# Patient Record
Sex: Female | Born: 1980 | Race: Black or African American | Hispanic: No | Marital: Single | Smoking: Former smoker
Health system: Southern US, Community
[De-identification: ages and names within clinical notes are randomized; demographics above are authoritative.]

## PROBLEM LIST (undated history)

## (undated) ENCOUNTER — Inpatient Hospital Stay (HOSPITAL_COMMUNITY): Payer: Self-pay

## (undated) ENCOUNTER — Emergency Department (HOSPITAL_COMMUNITY): Payer: Medicaid Other | Source: Home / Self Care

## (undated) DIAGNOSIS — F410 Panic disorder [episodic paroxysmal anxiety] without agoraphobia: Secondary | ICD-10-CM

## (undated) DIAGNOSIS — E039 Hypothyroidism, unspecified: Secondary | ICD-10-CM

## (undated) DIAGNOSIS — I729 Aneurysm of unspecified site: Secondary | ICD-10-CM

## (undated) DIAGNOSIS — N390 Urinary tract infection, site not specified: Secondary | ICD-10-CM

## (undated) DIAGNOSIS — R569 Unspecified convulsions: Secondary | ICD-10-CM

## (undated) DIAGNOSIS — G5603 Carpal tunnel syndrome, bilateral upper limbs: Secondary | ICD-10-CM

## (undated) DIAGNOSIS — E079 Disorder of thyroid, unspecified: Secondary | ICD-10-CM

## (undated) HISTORY — DX: Disorder of thyroid, unspecified: E07.9

## (undated) HISTORY — PX: ANEURYSM COILING: SHX5349

## (undated) HISTORY — DX: Hypothyroidism, unspecified: E03.9

## (undated) HISTORY — PX: BRAIN SURGERY: SHX531

## (undated) HISTORY — PX: TONSILLECTOMY: SUR1361

---
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
## 2000-06-18 DIAGNOSIS — I729 Aneurysm of unspecified site: Secondary | ICD-10-CM

## 2000-06-18 HISTORY — DX: Aneurysm of unspecified site: I72.9

## 2001-03-16 ENCOUNTER — Emergency Department (HOSPITAL_COMMUNITY): Admission: EM | Admit: 2001-03-16 | Discharge: 2001-03-16 | Payer: Self-pay | Admitting: Emergency Medicine

## 2001-03-16 ENCOUNTER — Inpatient Hospital Stay (HOSPITAL_COMMUNITY): Admission: EM | Admit: 2001-03-16 | Discharge: 2001-03-29 | Payer: Self-pay | Admitting: Emergency Medicine

## 2001-03-16 ENCOUNTER — Encounter (INDEPENDENT_AMBULATORY_CARE_PROVIDER_SITE_OTHER): Payer: Self-pay | Admitting: *Deleted

## 2001-03-16 ENCOUNTER — Encounter: Payer: Self-pay | Admitting: Neurosurgery

## 2001-03-16 ENCOUNTER — Encounter: Payer: Self-pay | Admitting: Emergency Medicine

## 2001-03-17 ENCOUNTER — Encounter: Payer: Self-pay | Admitting: Neurosurgery

## 2001-03-19 ENCOUNTER — Encounter: Payer: Self-pay | Admitting: Neurosurgery

## 2001-03-21 ENCOUNTER — Encounter: Payer: Self-pay | Admitting: Neurosurgery

## 2001-03-22 ENCOUNTER — Encounter: Payer: Self-pay | Admitting: Neurosurgery

## 2001-03-24 ENCOUNTER — Encounter: Payer: Self-pay | Admitting: Neurosurgery

## 2001-10-30 ENCOUNTER — Emergency Department (HOSPITAL_COMMUNITY): Admission: EM | Admit: 2001-10-30 | Discharge: 2001-10-30 | Payer: Self-pay | Admitting: Emergency Medicine

## 2002-02-26 ENCOUNTER — Emergency Department (HOSPITAL_COMMUNITY): Admission: EM | Admit: 2002-02-26 | Discharge: 2002-02-26 | Payer: Self-pay

## 2002-08-14 ENCOUNTER — Emergency Department (HOSPITAL_COMMUNITY): Admission: EM | Admit: 2002-08-14 | Discharge: 2002-08-14 | Payer: Self-pay | Admitting: Emergency Medicine

## 2002-08-31 ENCOUNTER — Emergency Department (HOSPITAL_COMMUNITY): Admission: EM | Admit: 2002-08-31 | Discharge: 2002-08-31 | Payer: Self-pay | Admitting: Emergency Medicine

## 2002-12-16 ENCOUNTER — Emergency Department (HOSPITAL_COMMUNITY): Admission: EM | Admit: 2002-12-16 | Discharge: 2002-12-16 | Payer: Self-pay | Admitting: Emergency Medicine

## 2003-04-24 ENCOUNTER — Inpatient Hospital Stay (HOSPITAL_COMMUNITY): Admission: AD | Admit: 2003-04-24 | Discharge: 2003-04-24 | Payer: Self-pay | Admitting: *Deleted

## 2003-05-28 ENCOUNTER — Inpatient Hospital Stay (HOSPITAL_COMMUNITY): Admission: AD | Admit: 2003-05-28 | Discharge: 2003-05-28 | Payer: Self-pay | Admitting: Obstetrics and Gynecology

## 2003-06-11 ENCOUNTER — Emergency Department (HOSPITAL_COMMUNITY): Admission: EM | Admit: 2003-06-11 | Discharge: 2003-06-11 | Payer: Self-pay | Admitting: Emergency Medicine

## 2003-10-11 ENCOUNTER — Emergency Department (HOSPITAL_COMMUNITY): Admission: EM | Admit: 2003-10-11 | Discharge: 2003-10-11 | Payer: Self-pay | Admitting: Emergency Medicine

## 2004-01-04 ENCOUNTER — Emergency Department (HOSPITAL_COMMUNITY): Admission: EM | Admit: 2004-01-04 | Discharge: 2004-01-04 | Payer: Self-pay | Admitting: Emergency Medicine

## 2004-07-15 ENCOUNTER — Emergency Department (HOSPITAL_COMMUNITY): Admission: EM | Admit: 2004-07-15 | Discharge: 2004-07-15 | Payer: Self-pay | Admitting: Emergency Medicine

## 2005-05-19 ENCOUNTER — Emergency Department (HOSPITAL_COMMUNITY): Admission: EM | Admit: 2005-05-19 | Discharge: 2005-05-19 | Payer: Self-pay | Admitting: Emergency Medicine

## 2005-06-30 ENCOUNTER — Emergency Department (HOSPITAL_COMMUNITY): Admission: EM | Admit: 2005-06-30 | Discharge: 2005-06-30 | Payer: Self-pay | Admitting: Emergency Medicine

## 2006-05-20 ENCOUNTER — Emergency Department (HOSPITAL_COMMUNITY): Admission: EM | Admit: 2006-05-20 | Discharge: 2006-05-20 | Payer: Self-pay | Admitting: Emergency Medicine

## 2006-07-26 ENCOUNTER — Emergency Department (HOSPITAL_COMMUNITY): Admission: EM | Admit: 2006-07-26 | Discharge: 2006-07-26 | Payer: Self-pay | Admitting: Emergency Medicine

## 2006-11-29 ENCOUNTER — Emergency Department (HOSPITAL_COMMUNITY): Admission: EM | Admit: 2006-11-29 | Discharge: 2006-11-29 | Payer: Self-pay | Admitting: Emergency Medicine

## 2007-01-30 ENCOUNTER — Emergency Department (HOSPITAL_COMMUNITY): Admission: EM | Admit: 2007-01-30 | Discharge: 2007-01-30 | Payer: Self-pay | Admitting: Emergency Medicine

## 2007-06-28 ENCOUNTER — Emergency Department (HOSPITAL_COMMUNITY): Admission: EM | Admit: 2007-06-28 | Discharge: 2007-06-28 | Payer: Self-pay | Admitting: Emergency Medicine

## 2007-07-03 ENCOUNTER — Emergency Department (HOSPITAL_COMMUNITY): Admission: EM | Admit: 2007-07-03 | Discharge: 2007-07-03 | Payer: Self-pay | Admitting: Emergency Medicine

## 2007-11-10 ENCOUNTER — Emergency Department (HOSPITAL_COMMUNITY): Admission: EM | Admit: 2007-11-10 | Discharge: 2007-11-10 | Payer: Self-pay | Admitting: Emergency Medicine

## 2008-02-12 ENCOUNTER — Emergency Department (HOSPITAL_COMMUNITY): Admission: EM | Admit: 2008-02-12 | Discharge: 2008-02-12 | Payer: Self-pay | Admitting: Emergency Medicine

## 2008-03-16 ENCOUNTER — Emergency Department (HOSPITAL_COMMUNITY): Admission: EM | Admit: 2008-03-16 | Discharge: 2008-03-16 | Payer: Self-pay | Admitting: Emergency Medicine

## 2008-05-16 ENCOUNTER — Emergency Department (HOSPITAL_COMMUNITY): Admission: EM | Admit: 2008-05-16 | Discharge: 2008-05-16 | Payer: Self-pay | Admitting: Emergency Medicine

## 2008-05-25 ENCOUNTER — Emergency Department (HOSPITAL_COMMUNITY): Admission: EM | Admit: 2008-05-25 | Discharge: 2008-05-25 | Payer: Self-pay | Admitting: Emergency Medicine

## 2009-02-19 ENCOUNTER — Emergency Department (HOSPITAL_COMMUNITY): Admission: EM | Admit: 2009-02-19 | Discharge: 2009-02-19 | Payer: Self-pay | Admitting: Emergency Medicine

## 2009-05-17 ENCOUNTER — Emergency Department (HOSPITAL_COMMUNITY): Admission: EM | Admit: 2009-05-17 | Discharge: 2009-05-17 | Payer: Self-pay | Admitting: Emergency Medicine

## 2009-09-23 ENCOUNTER — Emergency Department (HOSPITAL_COMMUNITY): Admission: EM | Admit: 2009-09-23 | Discharge: 2009-09-23 | Payer: Self-pay | Admitting: Emergency Medicine

## 2010-05-06 ENCOUNTER — Emergency Department (HOSPITAL_COMMUNITY): Admission: EM | Admit: 2010-05-06 | Discharge: 2010-05-06 | Payer: Self-pay | Admitting: Emergency Medicine

## 2010-05-08 ENCOUNTER — Emergency Department (HOSPITAL_COMMUNITY): Admission: EM | Admit: 2010-05-08 | Discharge: 2010-05-08 | Payer: Self-pay

## 2010-06-29 ENCOUNTER — Emergency Department (HOSPITAL_COMMUNITY)
Admission: EM | Admit: 2010-06-29 | Discharge: 2010-06-29 | Payer: Self-pay | Source: Home / Self Care | Admitting: Emergency Medicine

## 2010-08-29 LAB — URINE MICROSCOPIC-ADD ON

## 2010-08-29 LAB — URINALYSIS, ROUTINE W REFLEX MICROSCOPIC
Glucose, UA: NEGATIVE mg/dL
pH: 6 (ref 5.0–8.0)

## 2010-09-02 ENCOUNTER — Emergency Department (HOSPITAL_COMMUNITY)
Admission: EM | Admit: 2010-09-02 | Discharge: 2010-09-02 | Disposition: A | Discharge status: Home / Self Care | Payer: Medicaid Other | Attending: Emergency Medicine | Admitting: Emergency Medicine

## 2010-09-02 DIAGNOSIS — E669 Obesity, unspecified: Secondary | ICD-10-CM | POA: Insufficient documentation

## 2010-09-02 DIAGNOSIS — IMO0001 Reserved for inherently not codable concepts without codable children: Secondary | ICD-10-CM | POA: Insufficient documentation

## 2010-09-02 DIAGNOSIS — M542 Cervicalgia: Secondary | ICD-10-CM | POA: Insufficient documentation

## 2010-09-02 DIAGNOSIS — M546 Pain in thoracic spine: Secondary | ICD-10-CM | POA: Insufficient documentation

## 2010-09-02 DIAGNOSIS — S239XXA Sprain of unspecified parts of thorax, initial encounter: Secondary | ICD-10-CM | POA: Insufficient documentation

## 2010-11-03 NOTE — Op Note (Signed)
[facility]  Patient:    Hannah Cook, Hannah Cook Visit Number: [id number] MRN: [id number]          Service Type: EMS Location: ED Attending Physician:  Ilene Qua Dictated by:   Mena Goes Franky Macho, M.D. Proc. Date: 03/18/01 Admit Date:  03/16/2001 Discharge Date: 03/16/2001                             Operative Report  PREOPERATIVE DIAGNOSES: 1. Left parietal intracerebral hematoma. 2. Hydrocephalus.  POSTOPERATIVE DIAGNOSES: 1. Left parietal intracerebral hematoma. 2. Hydrocephalus.  PROCEDURE: 1. Left frontal ventricular catheter placement via burr hole. 2. Left parietal craniotomy with separate incision for hematoma evacuation.  SURGEON:  Kyle L. Franky Macho, M.D.  ASSISTANT:  Hewitt Shorts, M.D.  FINDINGS:  Clotted blood in the left parietal lobe.  COMPLICATIONS:  None.  INDICATIONS FOR PROCEDURE:  Hannah Cook is a 30 year old woman with a spontaneous intracerebral hematoma in the left parietal region.  There was intraventricular extension of the blood causing some mild hydrocephalus.  The patient had an angiogram which was negative.  I therefore decided to take the patient to the operating room in order to decrease the pressure in her brain and in the ventricles by doing both a craniotomy for hematoma evacuation and ventricular catheter placement for CSF drainage.  DESCRIPTION OF PROCEDURE:  Hannah Cook was brought to the operating room, intubated and placed under general anesthesia without difficulty.  The head was placed in three pinned Mayfield fixation with the left side of the head turned parallel to the floor.  Her head was then shaved, prepped and draped. I infiltrated 20 cc of 1/2% lidocaine 1:200,000 epinephrine into my proposed craniotomy incision, and a separate incision for the ventricular catheter. The patient was then draped in a sterile fashion.  I then made the incision for the ventricular catheter and used the  high speed drill to create a burr hole.  This was done on the left frontal region anterior to the ear.  The catheter was then placed into the frontal lobe of the left ventricle, and CSF drained spontaneously.  The catheter was then tunneled posteriorly, and brought through the scalp.  It was then secured to the skin with a nylon suture.  It was capped off and placed out of the way of the craniotomy incision.  Dr. Newell Coral and I then performed the craniotomy.  I made an incision in the left parietal region in a horse shoe fashion, starting approximately 6.5 cm above the top of the ear on the left side.  This was on the posterior parietal area.  After retracting the scalp flap inferiorly, ______ posts were placed by both myself and Dr. Newell Coral.  I then made three burr holes approximately 120 degrees apart.  The burr holes were then connected with the craniotome attachment.  The flap was then raised.  An opening was made in the middle of the flap using a 15 blade.  The edges were coagulated.  Dr. Newell Coral then passed a brain needle into the region where we thought the blood clot would be found.  As I held the needle in position, he aspirated and removed approximately 8 cc of thick dark reddish-blue liquefied blood.  After that was done, the brain was still tense.  Using the small opening that we had made, we tried using a nasal speculum as a retractor to remove the clot.  However, the clot  was far more tenatious than anticipated. At that time, I made a decision to open the dura in a horse shoe fashion basing a flap medially.  After that was done, a large ______ was performed after coagulated the brain surface with bipolar cautery.  Once that was done, then the remainder of the clot was removed with suction and irrigation using the nasal speculum as a retractor.  After that was done, the brain was then very well relaxed.  3-0 ______  sutures were then placed around the edge of the craniotomy.   The dura was then reapproximated with dural sutures.  The scalp flap was then reapproximated with plates and screws.  The scalp flap was then closed in layers, first subgaleal Vicryl sutures, superficial scalp edges with staples.  Sterile dressing was applied to both the scalp flap and the ventricular flap.  Prior to closure, hemostasis was obtained after irrigation and bipolar cautery in the resection of the hematoma cavity.  I was satisfied with hemostasis prior to closure.  A sterile dressing was applied.  The patient was removed from the pins.  The patient was extubated, moving all extremities well, back to the neurosurgical intensive care unit for recovery. Dictated by:   Mena Goes. Franky Macho, M.D. Attending Physician:  Ilene Qua DD:  03/18/01 TD:  03/19/01 Job: [id number]

## 2010-11-03 NOTE — H&P (Signed)
Hannah Cook. Hannah Cook  Patient:    Hannah, Cook Visit Number: [id number] MRN: [id number]          Service Type: MED Location: 1800 1828 01 Attending Physician:  Hannah Cook Dictated by:   Hannah Cook. Hannah Cook, M.D. Admit Date:  03/16/2001                           History and Physical  ADMITTING DIAGNOSIS:  Left parietal intracerebral hematoma.  INDICATIONS:  Hannah Cook is a 30 year old woman who was dropped off at [facility] this afternoon.  Ms. Dorin has been obtunded since admission, but was able to tell the physicians over there that she had had a headache earlier this morning.  The story given by the friend who dropped her off was that she had fallen out of bed.  They said this took place approximately 8 a.m.  A head CT scan was performed and it showed a large, approximately 6 cm, intracerebral hematoma in the left parietal region with interventricular extension.  The patient is sent to [facility] for evaluation.  She did have one seizure which was witnessed and grand mal in nature while here in the [facility].  PAST MEDICAL HISTORY:  Unknown.  PAST SURGICAL HISTORY:  Unknown.  ALLERGIES:  Unknown.  MEDICATIONS:  Unknown.  SOCIAL HISTORY:  The patient does have two children.  She says one child is with the uncle now.  The uncle and grandmother have been contacted.  REVIEW OF SYSTEMS:  It was noted on the examination at [facility] that she had severe pain with motion of her head, which I can attest to here at the [facility].  PHYSICAL EXAMINATION:  VITAL SIGNS:  Temperature 98.8, pulse 70, blood pressure 120/63, respiratory rate approximately 39.  She is now in a postictal state.  GENERAL:  Prior to her seizure, the patient was lethargic.  She did answer questions, though with one word.  It was nonfluent.  She did follow some commands.  She appeared to have a  right-sided neglect.  She appears also to have a right-sided visual field deficit.  HEENT:  Pupils are equal, round, and reactive to light.  I could not assess extraocular movements as the patient was not cooperative enough.  She had no facial asymmetry.  She has her head turned towards the left side and had significant pain when I rotated her head back to the midline.  She is moving on left side spontaneously, but right side less so.  She moves right lower extremity more than right upper extremity, but does have at the very least 4/5 strength in the right upper extremity.  She did not appear to have good sensation in the lower extremity, but again, detailed testing impossible secondary to patients mental status.  The patient is oxygenating well at this point in time.  The patient currently is nonresponsive, with eyes deviated conjugate slightly towards the right side.  Pupils are still reactive.  They are regular and round.  LUNGS:  Lung fields are clear.  HEART:  Regular rhythm and rate.  No murmurs or rubs.  EXTREMITIES:  Pulses good at the wrist and feet bilaterally.  No clubbing, cyanosis, or edema.  PLAN:  The patient will be admitted to the neurosurgical intensive care unit. She will be loaded with 1 gram of Dilantin.  She does have interventricular extension of the  blood on a CT scan and, therefore, ventricular catheter probably will be placed.  Also, secondary to decreased mental status, we will obtain another CT examination which will be approximately two hours after the initial one just to reassess and make sure things have not changed significantly.  Blood pressure has not been a problem.  We will try to obtain greater history from the family members. Dictated by:   Hannah Cook. Hannah Cook, M.D. Attending Physician:  Hannah Cook DD:  03/16/01 TD:  03/16/01 Job: [id number]

## 2010-11-03 NOTE — Discharge Summary (Signed)
[facility]  Patient:    Hannah Cook, Hannah Cook Visit Number: [id number] MRN: [id number]          Service Type: SUR Location: 3000 3023 01 Attending Physician:  Coletta Memos Dictated by:   Mena Goes. Franky Macho, M.D. Admit Date:  03/16/2001 Discharge Date: 03/29/2001                             Discharge Summary  ADMISSION DIAGNOSIS:  Left parietal intracerebral hematoma with intraventricular extension.  DISCHARGE DIAGNOSES:  Left parietal intracerebral hematoma with intraventricular extension.  PROCEDURES: 1. Left parietal craniotomy for hematoma resection done on March 18, 2001. 2. Placement of left frontal intraventricular catheter on March 18, 2001.  COMPLICATIONS:  None.  DISCHARGE STATUS:  Alive and improved.  INDICATIONS:  Hannah Cook is a 30 year old woman who presented to [facility] in a stuporous state.  We were told that she had fallen out of bed.  She was simply dropped off by a friend and no one was available for a history.  Ms. Gasaway on admission CT scan had a left parietal intracerebral hematoma.  She was moving all extremities, somewhat cooperative but she did not follow all commands.  She did have a seizure which was noted in the emergency room and this did resolve.  After examining the patient and her complaints of headaches, I decided that she should be taken to the operating room.  So she was therefore taken to the operating room and had a left parietal craniotomy for hematoma evacuation and had a ventricular catheter placed.  Postoperatively, she still continues to complain of headaches and abdominal cramping.  An OB/GYN consultation was obtained.  She had a vaginal ultrasound which showed no retained products of conception.  Her child was born approximately three weeks prior to her admission.  She also had an ID consult as her temperature went up to 102.5 the night of admission.  She remained febrile at  times throughout her hospitalization.  No etiology was identified.  The ID team was satisfied that this was noninfectious cause.  The only etiology that could be determined was that she had the intracerebral hematoma.  At discharge, she is alert, oriented times four and answering all questions appropriately. She has no drift which is an improvement from her admission.  All extremities are moving well.  She has symmetric facial sensation and symmetric facial movements.  Her wound is healed well without signs of infection.  Follow up CT scan after resection showed some residual clot left postoperatively and clearance of the ventricles and also small ventricles.  She will be sent home with Dilantin 200 mg to be taken twice per day and Tylenol #3 for headaches. Dictated by:   Mena Goes. Franky Macho, M.D. Attending Physician:  Coletta Memos DD:  03/28/01 TD:  03/30/01 Job: [id number]

## 2010-12-07 ENCOUNTER — Emergency Department (HOSPITAL_COMMUNITY): Payer: Medicaid Other

## 2010-12-07 ENCOUNTER — Emergency Department (HOSPITAL_COMMUNITY)
Admission: EM | Admit: 2010-12-07 | Discharge: 2010-12-07 | Disposition: A | Discharge status: Home / Self Care | Payer: Medicaid Other | Attending: Emergency Medicine | Admitting: Emergency Medicine

## 2010-12-07 DIAGNOSIS — M546 Pain in thoracic spine: Secondary | ICD-10-CM | POA: Insufficient documentation

## 2010-12-07 DIAGNOSIS — M940 Chondrocostal junction syndrome [Tietze]: Secondary | ICD-10-CM | POA: Insufficient documentation

## 2010-12-07 DIAGNOSIS — R071 Chest pain on breathing: Secondary | ICD-10-CM | POA: Insufficient documentation

## 2010-12-07 DIAGNOSIS — G8929 Other chronic pain: Secondary | ICD-10-CM | POA: Insufficient documentation

## 2011-02-12 ENCOUNTER — Emergency Department (HOSPITAL_COMMUNITY): Payer: Medicaid Other

## 2011-02-12 ENCOUNTER — Emergency Department (HOSPITAL_COMMUNITY)
Admission: EM | Admit: 2011-02-12 | Discharge: 2011-02-12 | Disposition: A | Discharge status: Home / Self Care | Payer: Medicaid Other | Attending: Emergency Medicine | Admitting: Emergency Medicine

## 2011-02-12 DIAGNOSIS — R0602 Shortness of breath: Secondary | ICD-10-CM | POA: Insufficient documentation

## 2011-02-12 DIAGNOSIS — S139XXA Sprain of joints and ligaments of unspecified parts of neck, initial encounter: Secondary | ICD-10-CM | POA: Insufficient documentation

## 2011-02-12 DIAGNOSIS — M546 Pain in thoracic spine: Secondary | ICD-10-CM | POA: Insufficient documentation

## 2011-02-12 DIAGNOSIS — M542 Cervicalgia: Secondary | ICD-10-CM | POA: Insufficient documentation

## 2011-02-12 DIAGNOSIS — R0789 Other chest pain: Secondary | ICD-10-CM | POA: Insufficient documentation

## 2011-03-04 ENCOUNTER — Emergency Department (HOSPITAL_COMMUNITY)
Admission: EM | Admit: 2011-03-04 | Discharge: 2011-03-05 | Disposition: A | Discharge status: Home / Self Care | Payer: Medicaid Other | Attending: Emergency Medicine | Admitting: Emergency Medicine

## 2011-03-04 DIAGNOSIS — S335XXA Sprain of ligaments of lumbar spine, initial encounter: Secondary | ICD-10-CM | POA: Insufficient documentation

## 2011-03-04 DIAGNOSIS — S20219A Contusion of unspecified front wall of thorax, initial encounter: Secondary | ICD-10-CM | POA: Insufficient documentation

## 2011-03-04 DIAGNOSIS — T1490XA Injury, unspecified, initial encounter: Secondary | ICD-10-CM | POA: Insufficient documentation

## 2011-03-04 DIAGNOSIS — R071 Chest pain on breathing: Secondary | ICD-10-CM | POA: Insufficient documentation

## 2011-03-04 DIAGNOSIS — Y92410 Unspecified street and highway as the place of occurrence of the external cause: Secondary | ICD-10-CM | POA: Insufficient documentation

## 2011-06-13 ENCOUNTER — Emergency Department (HOSPITAL_COMMUNITY)
Admission: EM | Admit: 2011-06-13 | Discharge: 2011-06-13 | Discharge status: Against Medical Advice | Payer: Medicaid Other | Attending: Emergency Medicine | Admitting: Emergency Medicine

## 2011-06-13 ENCOUNTER — Encounter: Payer: Self-pay | Admitting: Emergency Medicine

## 2011-06-13 DIAGNOSIS — R569 Unspecified convulsions: Secondary | ICD-10-CM | POA: Insufficient documentation

## 2011-06-13 HISTORY — DX: Unspecified convulsions: R56.9

## 2011-06-13 NOTE — ED Notes (Signed)
Pt arrived via EMS where she was found by friends to have a seizure. Pt was noted by EMS to be post-ictal. Pt now A/Ox4. Pt is noted to have some minimal trauma to tongue and urinary incontinence post seizure. EMS x2 for IV without any success. CBG 141.

## 2011-07-19 ENCOUNTER — Emergency Department (HOSPITAL_COMMUNITY)
Admission: EM | Admit: 2011-07-19 | Discharge: 2011-07-19 | Disposition: A | Discharge status: Home / Self Care | Payer: Medicaid Other | Attending: Emergency Medicine | Admitting: Emergency Medicine

## 2011-07-19 ENCOUNTER — Encounter (HOSPITAL_COMMUNITY): Payer: Self-pay | Admitting: Emergency Medicine

## 2011-07-19 DIAGNOSIS — R51 Headache: Secondary | ICD-10-CM | POA: Insufficient documentation

## 2011-07-19 DIAGNOSIS — R110 Nausea: Secondary | ICD-10-CM | POA: Insufficient documentation

## 2011-07-19 DIAGNOSIS — R29818 Other symptoms and signs involving the nervous system: Secondary | ICD-10-CM

## 2011-07-19 DIAGNOSIS — F411 Generalized anxiety disorder: Secondary | ICD-10-CM | POA: Insufficient documentation

## 2011-07-19 MED ORDER — LEVETIRACETAM 500 MG PO TABS: 500.0000 mg | ORAL_TABLET | Freq: Two times a day (BID) | ORAL | Status: DC

## 2011-07-19 MED ORDER — LEVETIRACETAM 500 MG PO TABS
500.0000 mg | ORAL_TABLET | Freq: Once | ORAL | Status: AC
  Administered 2011-07-19: 500 mg via ORAL
  Filled 2011-07-19: qty 1

## 2011-07-19 NOTE — ED Notes (Signed)
Patient has called out twice at this point wanting to see the doctor.  Pt has been told that the doctor knows that she is here, and that he will be in as soon as he can.  Pt states that she wants medication so that she doesn't have a seizure.  Pt then states, what will he come in if I have a seizure.  Pt told that he will be in as soon as possible.

## 2011-07-19 NOTE — ED Notes (Signed)
Pt states that she has a stress related seizure.  Pt states that she stopped taking her medication (which we believe could be dilantin) a year ago because she didn't like the way it made her feel.  Pt states that she gets anxious/stressed and feels like she is going to have a seizure.

## 2011-07-19 NOTE — ED Provider Notes (Signed)
Medical screening examination/treatment/procedure(s) were conducted as a shared visit with non-physician practitioner(s) and myself.  I personally evaluated the patient during the encounter  The patient was given Keppra the emergency department.  She's been given a prescription for Keppra.  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  She has a neurologist in [place].  Recommended that she call the neurologist today for followup.  The patient has not had a seizure.   Lyanne Co, MD 07/19/11 [phone]

## 2011-07-19 NOTE — ED Provider Notes (Signed)
History     CSN: [id number]  Arrival date & time 07/19/11  0355   First MD Initiated Contact with Patient 07/19/11 0411      Chief Complaint  Patient presents with  . Seizures    no seizure activity tonight, "Feels like I am going to have one."    (Consider location/radiation/quality/duration/timing/severity/associated sxs/prior treatment) HPI Comments: Patient presents with the sensation that she is going to have a seizure - she reports that she has not had one - has a history of seizures and states that the last one was 12/26 - states she used to be on medication for this in the past but has not taken anything in about one year - she normally sees [facility] for this but also did not follow up with them after the previous seizures.  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  She reports photophobia and nausea with this, denies headache, focal weakness, staring off into space, slurred speech, numbness or tingling.  Review of records from 12/26 at [facility] revealed that the patient signed herself out AMA before seeing a provider.  Patient is a 31 y.o. female presenting with seizures. The history is provided by the patient. No language interpreter was used.  Seizures  This is a new problem. The current episode started 1 to 2 hours ago. The problem has not changed since onset.Number of times: did not actually have a seizure. Associated symptoms include headaches and nausea. Pertinent negatives include no sleepiness, no confusion, no speech difficulty, no visual disturbance, no neck stiffness, no sore throat, no chest pain, no cough, no vomiting, no diarrhea and no muscle weakness. There was the sensation of an aura present. The seizures did not continue in the ED. Possible causes do not include recent illness or change in alcohol use. There has been no fever.    Past Medical History  Diagnosis Date  . Asthma   . Seizures     Stress induced  seizures    No past surgical history on file.  No family history on file.  History  Substance Use Topics  . Smoking status: Not on file  . Smokeless tobacco: Not on file  . Alcohol Use:     OB History    Grav Para Term Preterm Abortions TAB SAB Ect Mult Living                  Review of Systems  HENT: Negative for sore throat.   Eyes: Negative for visual disturbance.  Respiratory: Negative for cough.   Cardiovascular: Negative for chest pain.  Gastrointestinal: Positive for nausea. Negative for vomiting and diarrhea.  Neurological: Positive for headaches. Negative for seizures and speech difficulty.  Psychiatric/Behavioral: Negative for confusion.  All other systems reviewed and are negative.    Allergies  Review of patient's allergies indicates no known allergies.  Home Medications  No current outpatient prescriptions on file.  BP 135/79  Pulse 103  Temp(Src) 99.4 F (37.4 C) (Oral)  Resp 18  Wt 170 lb (77.111 kg)  SpO2 100%  Physical Exam  Nursing note and vitals reviewed. Constitutional: She is oriented to person, place, and time. She appears well-developed and well-nourished. No distress.       anxious  HENT:  Head: Normocephalic and atraumatic.  Right Ear: External ear normal.  Left Ear: External ear normal.  Nose: Nose normal.  Mouth/Throat: Oropharynx is clear and moist. No oropharyngeal exudate.  Eyes: Conjunctivae  are normal. Pupils are equal, round, and reactive to light. No scleral icterus.  Neck: Normal range of motion. Neck supple.  Cardiovascular: Normal rate, regular rhythm and normal heart sounds.  Exam reveals no gallop and no friction rub.   No murmur heard. Pulmonary/Chest: Effort normal and breath sounds normal. No respiratory distress.  Abdominal: Soft. Bowel sounds are normal. She exhibits no distension. There is no tenderness.  Musculoskeletal: Normal range of motion. She exhibits no edema and no tenderness.  Lymphadenopathy:     She has no cervical adenopathy.  Neurological: She is alert and oriented to person, place, and time. No cranial nerve deficit. She exhibits normal muscle tone. Coordination normal.  Skin: Skin is warm and dry. No rash noted. No erythema. No pallor.  Psychiatric: She has a normal mood and affect. Her behavior is normal. Judgment and thought content normal.    ED Course  Procedures (including critical care time)  Labs Reviewed - No data to display No results found.   Aura without seizure   MDM  Review of recent records here reveal no visits for seizures but CT scan from 2006 does not left parietal clips and craniotomy changes consistent with the patient's story.  Plan to give the patient an oral dose of Keppra 500mg  and then place the patient on this medication until she can follow up with her neurologist at [facility].       Hannah Cook [place], [place] 07/19/11 [phone]

## 2011-07-19 NOTE — ED Notes (Signed)
[id number]<JY> Expected date:<BR> Expected time:<BR> Means of arrival:<BR> Comments:<BR> EMS/female with hx seizure-having &quot;aura&quot;

## 2011-08-18 ENCOUNTER — Emergency Department (HOSPITAL_COMMUNITY)
Admission: EM | Admit: 2011-08-18 | Discharge: 2011-08-18 | Disposition: A | Discharge status: Home / Self Care | Payer: Medicaid Other | Attending: Emergency Medicine | Admitting: Emergency Medicine

## 2011-08-18 ENCOUNTER — Encounter (HOSPITAL_COMMUNITY): Payer: Self-pay | Admitting: Emergency Medicine

## 2011-08-18 DIAGNOSIS — Z5189 Encounter for other specified aftercare: Secondary | ICD-10-CM | POA: Insufficient documentation

## 2011-08-18 DIAGNOSIS — H53149 Visual discomfort, unspecified: Secondary | ICD-10-CM | POA: Insufficient documentation

## 2011-08-18 DIAGNOSIS — G40909 Epilepsy, unspecified, not intractable, without status epilepticus: Secondary | ICD-10-CM | POA: Insufficient documentation

## 2011-08-18 DIAGNOSIS — Z79899 Other long term (current) drug therapy: Secondary | ICD-10-CM

## 2011-08-18 DIAGNOSIS — J45909 Unspecified asthma, uncomplicated: Secondary | ICD-10-CM | POA: Insufficient documentation

## 2011-08-18 NOTE — ED Notes (Addendum)
Patient complaining of seizures; patient states that she had a seizure last night.  Patient also complaining of light sensitivity.  Patient states that she takes medications for the seizures every night (has bee prescribed medications at both [facility] and at [facility]), but states that she feels that she has been having seizures every night for the past two months.  Patient had an EEG last week in [facility]; waiting for the results.  Patient wanting to know if [facility] can prescribe a different medication for her seizures.

## 2011-08-18 NOTE — ED Provider Notes (Signed)
History     CSN: [id number]  Arrival date & time 08/18/11  2037   First MD Initiated Contact with Patient 08/18/11 2151      Chief Complaint  Patient presents with  . Seizures    (Consider location/radiation/quality/duration/timing/severity/associated sxs/prior treatment) The history is provided by the patient.   31 year old female with past medical history of brain aneurysm and seizures presents for medication evaluation. She states that she currently sees a neurologist in [facility]. She states that she has had seizures for some time, but around 9 years ago stopped having them, so took herself off medication. States that she began to have seizures again around December 2012. She is currently having multiple seizures a day. She states that she feels "as if I'm in another world "prior to her events. The seizures themselves are described as periods of staring and loss of control of her bladder. She does not typically have any convulsions. She states that "a lot of times I feel it coming on, and can calm myself down to prevent me from having it. I usually go upstairs to prevent my children from seeing me." States that she does sometimes have post ictal symptoms.  She was seen by her neurologist around February 22, when an EEG was performed. She has an upcoming appointment with him on March 7 to discuss the results and to possibly get her medications adjusted. He currently has her on a regimen of Keppra 1000 mg twice a day as well as 25 mg of Topamax twice a day. She states that "I take more medication than prescribed, because it's not working to stop me from having seizures." She requests a change in her medication regimen.  Past Medical History  Diagnosis Date  . Asthma   . Seizures     Stress induced seizures    History reviewed. No pertinent past surgical history.  History reviewed. No pertinent family history.  History  Substance Use Topics  . Smoking status: Current Some Day Smoker   . Smokeless tobacco: Not on file  . Alcohol Use: No    OB History    Grav Para Term Preterm Abortions TAB SAB Ect Mult Living                  Review of Systems  Constitutional: Negative for fever, chills, activity change, appetite change and unexpected weight change.  HENT: Negative for congestion, rhinorrhea and neck pain.   Eyes: Positive for photophobia. Negative for visual disturbance.       States that she has had photosensitivity since her aneurysm clipping in 2000  Respiratory: Negative for chest tightness and shortness of breath.   Cardiovascular: Negative for chest pain and palpitations.  Gastrointestinal: Negative for nausea, vomiting and abdominal pain.  Genitourinary: Negative for dysuria.  Musculoskeletal: Negative for myalgias.  Skin: Negative for color change and rash.  Neurological: Positive for seizures. Negative for dizziness, syncope, speech difficulty, weakness, numbness and headaches.    Allergies  Review of patient's allergies indicates no known allergies.  Home Medications   Current Outpatient Rx  Name Route Sig Dispense Refill  . LEVETIRACETAM 500 MG PO TABS Oral Take 1,000 mg by mouth 2 (two) times daily.    . TOPIRAMATE 25 MG PO TABS Oral Take 25 mg by mouth 2 (two) times daily.      BP 115/78  Pulse 89  Temp(Src) 97.9 F (36.6 C) (Oral)  Resp 18  SpO2 100%  LMP 07/26/2011  Physical Exam  Nursing  note and vitals reviewed. Constitutional: She is oriented to person, place, and time. She appears well-developed and well-nourished. No distress.  HENT:  Head: Normocephalic and atraumatic.  Right Ear: External ear normal.  Left Ear: External ear normal.  Eyes: EOM are normal. Pupils are equal, round, and reactive to light.  Neck: Normal range of motion. Neck supple.  Cardiovascular: Normal rate, regular rhythm and normal heart sounds.   Pulmonary/Chest: Effort normal and breath sounds normal. She exhibits no tenderness.  Abdominal: Soft.  Bowel sounds are normal. There is no tenderness. There is no guarding.  Musculoskeletal: Normal range of motion.  Neurological: She is alert and oriented to person, place, and time. She displays normal reflexes. No cranial nerve deficit. She exhibits normal muscle tone. Coordination normal.  Skin: Skin is warm and dry. No rash noted. She is not diaphoretic.  Psychiatric: She has a normal mood and affect.    ED Course  Procedures (including critical care time)  Labs Reviewed - No data to display No results found.   1. 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 She has just had an EEG and is awaiting her appointment to discuss the results and to possibly adjust her medications. Appointment is in 5 days. Based on this, I explained to her that I do not feel comfortable adjusting her medications, as she is currently being followed by a specialist who should be able to provide optimal medication management, and has close followup with him. She became upset and eloped from the department at this point.       Grant [place], [place] 08/18/11 2237

## 2011-08-18 NOTE — ED Notes (Signed)
Pt left AMA after assessment by mid-level provider.

## 2011-08-18 NOTE — ED Notes (Signed)
Per PA pt walked out after assessment.

## 2011-08-22 NOTE — ED Provider Notes (Signed)
Evaluation and management procedures were performed by the PA/NP under my supervision/collaboration.    Felisa Bonier, MD 08/22/11 2029

## 2011-09-06 ENCOUNTER — Encounter (HOSPITAL_COMMUNITY): Payer: Self-pay

## 2011-09-06 ENCOUNTER — Emergency Department (HOSPITAL_COMMUNITY)
Admission: EM | Admit: 2011-09-06 | Discharge: 2011-09-06 | Disposition: A | Discharge status: Home / Self Care | Payer: No Typology Code available for payment source | Attending: Emergency Medicine | Admitting: Emergency Medicine

## 2011-09-06 DIAGNOSIS — J45909 Unspecified asthma, uncomplicated: Secondary | ICD-10-CM | POA: Insufficient documentation

## 2011-09-06 DIAGNOSIS — F17200 Nicotine dependence, unspecified, uncomplicated: Secondary | ICD-10-CM | POA: Insufficient documentation

## 2011-09-06 DIAGNOSIS — R0789 Other chest pain: Secondary | ICD-10-CM

## 2011-09-06 DIAGNOSIS — R569 Unspecified convulsions: Secondary | ICD-10-CM | POA: Insufficient documentation

## 2011-09-06 DIAGNOSIS — R071 Chest pain on breathing: Secondary | ICD-10-CM | POA: Insufficient documentation

## 2011-09-06 MED ORDER — HYDROCODONE-ACETAMINOPHEN 5-325 MG PO TABS
1.0000 | ORAL_TABLET | Freq: Once | ORAL | Status: DC
  Filled 2011-09-06: qty 1

## 2011-09-06 MED ORDER — HYDROCODONE-ACETAMINOPHEN 5-325 MG PO TABS: 1.0000 | ORAL_TABLET | Freq: Four times a day (QID) | ORAL | Status: AC | PRN
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
## 2011-09-06 NOTE — ED Notes (Signed)
Pt is A/A/Ox4, skin is warm and dry, respiration is even and unlabored.  
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
## 2011-09-06 NOTE — ED Provider Notes (Signed)
Medical screening examination/treatment/procedure(s) were performed by non-physician practitioner and as supervising physician I was immediately available for consultation/collaboration.   Marieli Rudy A. Eduin Friedel, MD 09/06/11 2141 
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
## 2011-10-09 ENCOUNTER — Encounter (HOSPITAL_COMMUNITY): Payer: Self-pay | Admitting: *Deleted

## 2011-10-09 ENCOUNTER — Emergency Department (HOSPITAL_COMMUNITY)
Admission: EM | Admit: 2011-10-09 | Discharge: 2011-10-10 | Disposition: A | Discharge status: Home / Self Care | Payer: Medicaid Other | Attending: Emergency Medicine | Admitting: Emergency Medicine

## 2011-10-09 DIAGNOSIS — T426X1A Poisoning by other antiepileptic and sedative-hypnotic drugs, accidental (unintentional), initial encounter: Secondary | ICD-10-CM | POA: Insufficient documentation

## 2011-10-09 NOTE — ED Notes (Signed)
Pt took 2 vimpat tablets which she is only supposed to take one of accidentally this pm.  Pt states that when she realized her mistake she became anxious and got some sob and some numbness.  Pt is calm, no sob or numbness at this time.  Pt is a little sleepy (she states that her seizure meds make her sleepy, she takes keppra also)

## 2011-10-10 NOTE — ED Notes (Signed)
Unable to locate pt  
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
## 2011-10-26 ENCOUNTER — Emergency Department (HOSPITAL_COMMUNITY)
Admission: EM | Admit: 2011-10-26 | Discharge: 2011-10-26 | Disposition: A | Discharge status: Home / Self Care | Payer: Medicaid Other | Attending: Emergency Medicine | Admitting: Emergency Medicine

## 2011-10-26 ENCOUNTER — Encounter (HOSPITAL_COMMUNITY): Payer: Self-pay

## 2011-10-26 DIAGNOSIS — S39012A Strain of muscle, fascia and tendon of lower back, initial encounter: Secondary | ICD-10-CM

## 2011-10-26 DIAGNOSIS — M545 Low back pain, unspecified: Secondary | ICD-10-CM | POA: Insufficient documentation

## 2011-10-26 DIAGNOSIS — R569 Unspecified convulsions: Secondary | ICD-10-CM | POA: Insufficient documentation

## 2011-10-26 DIAGNOSIS — J45909 Unspecified asthma, uncomplicated: Secondary | ICD-10-CM | POA: Insufficient documentation

## 2011-10-26 DIAGNOSIS — S335XXA Sprain of ligaments of lumbar spine, initial encounter: Secondary | ICD-10-CM | POA: Insufficient documentation

## 2011-10-26 DIAGNOSIS — X58XXXA Exposure to other specified factors, initial encounter: Secondary | ICD-10-CM | POA: Insufficient documentation

## 2011-10-26 MED ORDER — IBUPROFEN 800 MG PO TABS
800.0000 mg | ORAL_TABLET | Freq: Once | ORAL | Status: AC
  Administered 2011-10-26: 800 mg via ORAL
  Filled 2011-10-26: qty 1

## 2011-10-26 MED ORDER — IBUPROFEN 800 MG PO TABS: 800.0000 mg | ORAL_TABLET | Freq: Three times a day (TID) | ORAL | Status: AC

## 2011-10-26 NOTE — ED Provider Notes (Signed)
History     CSN: [id number]  Arrival date & time 10/26/11  1308   First MD Initiated Contact with Patient 10/26/11 [phone]      Chief Complaint  Patient presents with  . Flank Pain    (Consider location/radiation/quality/duration/timing/severity/associated sxs/prior treatment) HPI Low back pain bilateral lumbar area nonradiating onset when getting out of the bathtub yesterday treated with 2 Advil tablets yesterday without relief pain is worse with moving or changing positions improved with remaining still no fever no incontinence no other associated symptoms. Pain is moderate at present Past Medical History  Diagnosis Date  . Asthma   . Seizures     Stress induced seizures    History reviewed. No pertinent past surgical history. Cerebral aneurysm repair No family history on file.  History  Substance Use Topics  . Smoking status: Current Some Day Smoker  . Smokeless tobacco: Not on file  . Alcohol Use: No    OB History    Grav Para Term Preterm Abortions TAB SAB Ect Mult Living                  Review of Systems  Constitutional: Negative.   HENT: Negative.   Respiratory: Negative.   Cardiovascular: Negative.   Gastrointestinal: Negative.   Musculoskeletal: Positive for back pain.  Skin: Negative.   Neurological: Negative.   Hematological: Negative.   Psychiatric/Behavioral: Negative.   All other systems reviewed and are negative.    Allergies  Review of patient's allergies indicates no known allergies.  Home Medications   Current Outpatient Rx  Name Route Sig Dispense Refill  . HYDROCODONE-ACETAMINOPHEN 5-325 MG PO TABS Oral Take 1 tablet by mouth every 6 (six) hours as needed. For pain    . IBUPROFEN 200 MG PO TABS Oral Take 400 mg by mouth every 6 (six) hours as needed. For pain    . LACOSAMIDE 200 MG PO TABS Oral Take 200 mg by mouth 2 (two) times daily.    Marland Kitchen LEVETIRACETAM 750 MG PO TABS Oral Take 1,500 mg by mouth every 12 (twelve) hours.      BP  121/77  Pulse 80  Temp(Src) 98.1 F (36.7 C) (Oral)  Resp 16  SpO2 100%  LMP 09/28/2011  Physical Exam  Nursing note and vitals reviewed. Constitutional: She appears well-developed and well-nourished.  HENT:  Head: Normocephalic and atraumatic.  Eyes: Conjunctivae are normal. Pupils are equal, round, and reactive to light.  Neck: Neck supple. No tracheal deviation present. No thyromegaly present.  Cardiovascular: Normal rate and regular rhythm.   No murmur heard. Pulmonary/Chest: Effort normal and breath sounds normal.  Abdominal: Soft. Bowel sounds are normal. She exhibits no distension. There is no tenderness.  Musculoskeletal: Normal range of motion. She exhibits no edema and no tenderness.       Entire spine nontender. Complains of pain at lumbar area when standing up from a seated position  Neurological: She is alert. She has normal reflexes. Coordination normal.       Gait normal  Skin: Skin is warm and dry. No rash noted.  Psychiatric: She has a normal mood and affect.    ED Course  Procedures (including critical care time)  Labs Reviewed - No data to display No results found.   No diagnosis found.    MDM  Pain is musculoskeletal in etiology Plan prescription ibuprofen 800 mg 3 times a day Patient in followup at women's health or [facility] if not better in a  few days Diagnosis lumbar strain        Doug Sou, MD 10/26/11 [phone]
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
## 2011-10-26 NOTE — ED Notes (Signed)
NAD noted at time of d/c home. D/C inst given by MD and pt verbalized understanding. 

## 2011-10-26 NOTE — Discharge Instructions (Signed)
Back Pain, Adult Back pain is very common. The pain often gets better over time. The cause of back pain is usually not dangerous. Most people can learn to manage their back pain on their own.  HOME CARE   Stay active. Start with short walks on flat ground if you can. Try to walk farther each day.   Do not sit, drive, or stand in one place for more than 30 minutes. Do not stay in bed.   Do not avoid exercise or work. Activity can help your back heal faster.   Be careful when you bend or lift an object. Bend at your knees, keep the object close to you, and do not twist.   Sleep on a firm mattress. Lie on your side, and bend your knees. If you lie on your back, put a pillow under your knees.   Only take medicines as told by your doctor.   Put ice on the injured area.   Put ice in a plastic bag.   Place a towel between your skin and the bag.   Leave the ice on for 15 to 20 minutes, 3 to 4 times a day for the first 2 to 3 days. After that, you can switch between ice and heat packs.   Ask your doctor about back exercises or massage.   Avoid feeling anxious or stressed. Find good ways to deal with stress, such as exercise.  GET HELP RIGHT AWAY IF:   Your pain does not go away with rest or medicine.   Your pain does not go away in 1 week.   You have new problems.   You do not feel well.   The pain spreads into your legs.   You cannot control when you poop (bowel movement) or pee (urinate).   Your arms or legs feel weak or lose feeling (numbness).   You feel sick to your stomach (nauseous) or throw up (vomit).   You have belly (abdominal) pain.   You feel like you may pass out (faint).  MAKE SURE YOU:   Understand these instructions.   Will watch your condition.   Will get help right away if you are not doing well or get worse.  Document Released: 11/21/2007 Document Revised: 05/24/2011 Document Reviewed: 10/23/2010 [facility] Patient Information 2012 [place],  [place].   See the women's health clinic or the  urgent care Center or return if not improved in a few days. Take the medication prescribed for bad pain or Tylenol as directed for mild pain

## 2011-10-26 NOTE — ED Notes (Signed)
Pt in room on phone laughing and walking around room. Pt stated she is having lower back pain that suddenly started yesterday afternoon around 1400. Denies trauma or injury. States she took OTC ibuprofen 400mg  yesterday with out relief. MD in room at this time.

## 2011-10-26 NOTE — ED Notes (Signed)
Pt in room states she does not want to put on a gown or have x-rays taken.

## 2011-11-07 ENCOUNTER — Encounter (HOSPITAL_COMMUNITY): Payer: Self-pay | Admitting: *Deleted

## 2011-11-07 ENCOUNTER — Emergency Department (HOSPITAL_COMMUNITY)
Admission: EM | Admit: 2011-11-07 | Discharge: 2011-11-07 | Disposition: A | Discharge status: Home / Self Care | Payer: Medicaid Other | Attending: Emergency Medicine | Admitting: Emergency Medicine

## 2011-11-07 DIAGNOSIS — R51 Headache: Secondary | ICD-10-CM | POA: Insufficient documentation

## 2011-11-07 DIAGNOSIS — R42 Dizziness and giddiness: Secondary | ICD-10-CM | POA: Insufficient documentation

## 2011-11-07 DIAGNOSIS — R946 Abnormal results of thyroid function studies: Secondary | ICD-10-CM | POA: Insufficient documentation

## 2011-11-07 DIAGNOSIS — R5381 Other malaise: Secondary | ICD-10-CM | POA: Insufficient documentation

## 2011-11-07 DIAGNOSIS — J45909 Unspecified asthma, uncomplicated: Secondary | ICD-10-CM | POA: Insufficient documentation

## 2011-11-07 DIAGNOSIS — R002 Palpitations: Secondary | ICD-10-CM | POA: Insufficient documentation

## 2011-11-07 DIAGNOSIS — R7989 Other specified abnormal findings of blood chemistry: Secondary | ICD-10-CM

## 2011-11-07 NOTE — ED Provider Notes (Signed)
History     CSN: [id number]  Arrival date & time 11/07/11  1133   First MD Initiated Contact with Patient 11/07/11 1152      Chief Complaint  Patient presents with  . Dizziness  . Blurred Vision  . Abnormal Lab    (Consider location/radiation/quality/duration/timing/severity/associated sxs/prior treatment) Patient is a 31 y.o. female presenting with weakness. The history is provided by the patient.  Weakness The primary symptoms include headaches and dizziness. Primary symptoms do not include syncope, loss of consciousness, focal weakness, loss of sensation, memory loss, fever, nausea or vomiting. The symptoms are unchanged.  The headache is associated with weakness. The headache is not associated with neck stiffness.  Dizziness also occurs with weakness. Dizziness does not occur with nausea or vomiting.  Additional symptoms include weakness. Additional symptoms do not include neck stiffness.  Pt with dizziness, weakness since February (3 months). States was just discharged from [facility] where they did a work up for the same for 4 days, discharged her. Called her back yesterday and told her her TSH was 0.2 and it needed more testing. Pt states she does not have a primary care doctor so came here. Pt states as far as her dizziness and weakness, it is still there and it is unchanged.   Past Medical History  Diagnosis Date  . Asthma   . Seizures     Stress induced seizures    Past Surgical History  Procedure Date  . Brain surgery     No family history on file.  History  Substance Use Topics  . Smoking status: Former Games developer  . Smokeless tobacco: Not on file  . Alcohol Use: No    OB History    Grav Para Term Preterm Abortions TAB SAB Ect Mult Living                  Review of Systems  Constitutional: Negative for fever and chills.  HENT: Negative for neck stiffness.   Eyes: Negative for visual disturbance.  Respiratory: Negative.   Cardiovascular: Positive for  palpitations. Negative for syncope.  Gastrointestinal: Negative for nausea and vomiting.  Musculoskeletal: Negative.   Skin: Negative.   Neurological: Positive for dizziness, weakness, light-headedness and headaches. Negative for focal weakness and loss of consciousness.  Psychiatric/Behavioral: Negative for memory loss.    Allergies  Review of patient's allergies indicates no known allergies.  Home Medications   Current Outpatient Rx  Name Route Sig Dispense Refill  . ESCITALOPRAM OXALATE 10 MG PO TABS Oral Take 10 mg by mouth daily.    Marland Kitchen LEVETIRACETAM 750 MG PO TABS Oral Take 1,500 mg by mouth every 12 (twelve) hours.    Marland Kitchen LORAZEPAM 0.5 MG PO TABS Oral Take 0.5 mg by mouth every 8 (eight) hours. For anxiety      BP 117/69  Pulse 89  Temp(Src) 98.4 F (36.9 C) (Oral)  Resp 16  Ht 5\' 6"  (1.676 m)  Wt 180 lb (81.647 kg)  BMI 29.05 kg/m2  SpO2 100%  LMP 09/28/2011  Physical Exam  Nursing note and vitals reviewed. Constitutional: She is oriented to person, place, and time. She appears well-developed and well-nourished. No distress.  HENT:  Head: Normocephalic and atraumatic.  Eyes: Conjunctivae and EOM are normal. Pupils are equal, round, and reactive to light.  Neck: Neck supple.       Thyroid appears enlarged, no nodules felt  Cardiovascular: Normal rate, regular rhythm and normal heart sounds.  Exam reveals no gallop and no  friction rub.   No murmur heard. Pulmonary/Chest: Effort normal and breath sounds normal. No respiratory distress. She has no wheezes.  Musculoskeletal: Normal range of motion. She exhibits no edema.  Neurological: She is alert and oriented to person, place, and time.  Skin: Skin is warm and dry.  Psychiatric: She has a normal mood and affect.    ED Course  Procedures (including critical care time)  Pt with apparently low TSH level of 0.2 drawn 2 days ago. Pt does have thyroid enlargement on exam. Her VS are all within normal. She is in no  distress. Not tachycardic. Reports palpitations at times, but states she takes ativan for that and it helps. I think pt needs to follow up outpatient for further testing. She has no signs of thyroid storm. Pt will be given resource guide or instructed to go back to see her doctor at [facility].  1. Dizziness   2. Low TSH level       MDM          Lottie Mussel, PA 11/07/11 1544

## 2011-11-07 NOTE — Discharge Instructions (Signed)
Your thyroid should be further tested by a family doctor or endocrinologist. Please see the least below. You can also try [facility] here in town, or [facility].   RESOURCE GUIDE  Dental Problems  Patients with Medicaid: [facility] [phone] [address]                                           [phone] [address] Phone:  [phone]                                                  Phone:  [phone]  If unable to pay or uninsured, contact:  [facility] or [facility]. to become qualified for the adult dental clinic.  Chronic Pain Problems Contact [facility]  [phone] Patients need to be referred by their primary care doctor.  Insufficient Money for Medicine Contact [employer] Way:  call "211" or [facility] [phone].  No Primary Care Doctor Call [facility]  [phone] Other agencies that provide inexpensive medical care    [facility]  [phone]    [facility]  [phone]    [facility]  [phone]    [facility]  [phone]    [facility]  2062985385    [facility]  [phone]  Psychological Services [facility]  [phone] [facility]  [phone] [facility]   [phone] (emergency services 5300619623)  Substance Abuse Resources [facility]  [phone] [facility] [phone] [facility] [place] 3615555624 Floydene Flock [phone] Residential & Outpatient Substance Abuse Program  [phone]  Abuse/Neglect [facility] Child Abuse Hotline 818-188-0042 [facility] Child Abuse Hotline 517-576-8261 (After Hours)  Emergency Shelter [facility] [phone]  M[facility] [place] [facility] [phone] [facility] 5616327368  MRSA Hotline #:    (352)785-7243    [facility] Resources  [facility]     [employer]                          [facility] Dept. [address] [place]                       [address]      [address]                                                  Cristobal Goldmann Phone:  [phone]  Phone:  [phone]                 Phone:  [phone]  [facility] Phone:  [phone]  [facility] Child Abuse Hotline (418) 776-9854 562-603-1447 (After Hours)  Thyroid-Stimulating Hormone The amount of thyroid-stimulating hormone (TSH) or thyrotropin can be measured from a sample of blood. The TSH level can help diagnose thyroid gland or pituitary gland disorders, or monitor treatment of hypothyroidism and hyperthyroidism. TSH is produced by the pituitary gland, a tiny organ located below the brain. The pituitary gland is part of the body's feedback system to maintain stable levels of thyroid hormones released into the blood. Thyroid hormones help control the rate at which the body uses energy. The pituitary gland monitors the level of thyroid hormones released by the thyroid gland. The thyroid gland is a small butterfly-shaped gland that lies flat against the windpipe. If the thyroid gland does not release enough thyroid hormones, the pituitary gland detects the reduced thyroid hormone levels. The pituitary gland then makes more TSH to trigger the thyroid gland to produce more thyroid hormones. This increase in TSH is an effort to return the low thyroid hormones to normal levels. The increased TSH level is caused by the low thyroid hormone levels of an underactive thyroid (hypothyroidism). Symptoms of hypothyroidism include menstrual irregularities in women, weight gain, dry skin, constipation, cold intolerance, and fatigue. Rarely, a high TSH level can indicate a problem with the  pituitary gland. A high TSH level could also occur when there is an insufficient level of thyroid hormone medication in individuals receiving that medication. If the thyroid gland releases too much thyroid hormones, the pituitary gland detects the increased thyroid hormone levels. The pituitary gland then makes less TSH to slow the thyroid gland from producing thyroid hormones. This decrease in TSH is an effort to return the increased thyroid hormones to normal levels. The decreased TSH level is caused by the excess thyroid hormone levels of an overactive thyroid gland (hyperthyroidism). Symptoms associated with hyperthyroidism include rapid heart rate, weight loss, nervousness, hand tremors, irritated eyes, and difficulty sleeping. Rarely, a low TSH level can indicate a problem with the pituitary gland. PREPARATION FOR TEST No specific preparation is required for this blood test. A blood sample is obtained from a needle placed in a vein in your arm or from pricking the heel of an infant.  NORMAL FINDINGS  Adult: 0.5-5 milli-international Units/L (0.5-5 mIU/L)   Newborn: 3-20 milli-international Units/L (3-20 mIU/L)   Cord: 3-12 milli-international Units/L (3-12 mIU/L)  Ranges for normal findings may vary among different laboratories and hospitals. You should always check with your doctor after having lab work or other tests done to discuss the meaning of your test results and whether your values are considered within normal limits. MEANING OF TEST  Your caregiver will go over the test results with you and discuss the importance and meaning of your results, as well as treatment options and the need for additional tests if necessary. OBTAINING THE TEST RESULTS It is your responsibility to obtain your test results. Ask the lab or department performing the test when and how you will get your results. Document Released: 06/29/2004 Document Revised: 05/24/2011 Document Reviewed: 05/16/2008 [facility]  Patient Information 2012 [place], [place].

## 2011-11-07 NOTE — ED Notes (Signed)
Patient claims that she has been having blurry vision, dizziness and lightheadedness since Feb.  Patient claims that her TSH levels are low per [facility], where she recently had a stay.

## 2011-11-07 NOTE — ED Notes (Signed)
Patient was seen at [facility] and stayed at the hospital for 4 days.  She was treated for her dizziness and vision changes and was told it was related to her seizure dx. Patient has family hx of thyroid disease.  She had her level checked and was called today with report of tsh of 0.  Patient also states she has swelling in her neck.

## 2011-11-07 NOTE — ED Provider Notes (Signed)
Medical screening examination/treatment/procedure(s) were performed by non-physician practitioner and as supervising physician I was immediately available for consultation/collaboration.   Joya Gaskins, MD 11/07/11 [phone]

## 2012-01-28 ENCOUNTER — Emergency Department (HOSPITAL_COMMUNITY)
Admission: EM | Admit: 2012-01-28 | Discharge: 2012-01-28 | Disposition: A | Discharge status: Home / Self Care | Payer: Medicaid Other | Attending: Emergency Medicine | Admitting: Emergency Medicine

## 2012-01-28 ENCOUNTER — Encounter (HOSPITAL_COMMUNITY): Payer: Self-pay

## 2012-01-28 DIAGNOSIS — J069 Acute upper respiratory infection, unspecified: Secondary | ICD-10-CM | POA: Insufficient documentation

## 2012-01-28 DIAGNOSIS — J329 Chronic sinusitis, unspecified: Secondary | ICD-10-CM

## 2012-01-28 DIAGNOSIS — R509 Fever, unspecified: Secondary | ICD-10-CM | POA: Insufficient documentation

## 2012-01-28 DIAGNOSIS — Z87891 Personal history of nicotine dependence: Secondary | ICD-10-CM | POA: Insufficient documentation

## 2012-01-28 MED ORDER — AMOXICILLIN 500 MG PO CAPS: 500.0000 mg | ORAL_CAPSULE | Freq: Three times a day (TID) | ORAL | Status: AC

## 2012-01-28 NOTE — ED Provider Notes (Signed)
History   This chart was scribed for Hilario Quarry, MD by Melba Coon. The patient was seen in room TR07C/TR07C and the patient's care was started at 4:37PM.    CSN: [id number]  Arrival date & time 01/28/12  1614   First MD Initiated Contact with Patient 01/28/12 1632      Chief Complaint  Patient presents with  . URI  . Fever    (Consider location/radiation/quality/duration/timing/severity/associated sxs/prior treatment) The history is provided by the patient. No language interpreter was used.   Hannah Cook is a 31 y.o. female who presents to the Emergency Department complaining of persistent, moderate to severe fever with associated productive cough with yellow phlegm and sore throat with an onset 4 days ago. Fever of 101 yesterday. OTC meds do not alleviate the symptoms. No HA, neck pain,  rash, back pain, CP, SOB, abd pain, n/v/d, dysuria, or extremity pain, edema, weakness, numbness, or tingling. LNMP: Beginning of last month. Hx of seizures; takes meds for it. No other pertinent medical symptoms.   Past Medical History  Diagnosis Date  . Asthma   . Seizures     Stress induced seizures    Past Surgical History  Procedure Date  . Brain surgery     No family history on file.  History  Substance Use Topics  . Smoking status: Former Games developer  . Smokeless tobacco: Not on file  . Alcohol Use: No    OB History    Grav Para Term Preterm Abortions TAB SAB Ect Mult Living                  Review of Systems 10 Systems reviewed and all are negative for acute change except as noted in the HPI.   Allergies  Review of patient's allergies indicates no known allergies.  Home Medications   Current Outpatient Rx  Name Route Sig Dispense Refill  . IBUPROFEN 200 MG PO TABS Oral Take 400 mg by mouth once.    Marland Kitchen LEVETIRACETAM 750 MG PO TABS Oral Take 750 mg by mouth every 12 (twelve) hours.       BP 110/70  Pulse 91  Temp 99.4 F (37.4 C) (Oral)  Resp 18  SpO2  96%  Physical Exam  Nursing note and vitals reviewed. Constitutional: She is oriented to person, place, and time. She appears well-developed and well-nourished. No distress.  HENT:  Head: Normocephalic and atraumatic.  Right Ear: External ear normal.  Left Ear: External ear normal.       TMs nml.  Eyes: EOM are normal.  Neck: Neck supple. No tracheal deviation present.  Cardiovascular: Normal rate, regular rhythm and normal heart sounds.   No murmur heard. Pulmonary/Chest: Effort normal and breath sounds normal. No respiratory distress. She has no wheezes. She has no rales.  Musculoskeletal: Normal range of motion. She exhibits no edema and no tenderness.  Neurological: She is alert and oriented to person, place, and time.  Skin: Skin is warm and dry. No rash noted.  Psychiatric: She has a normal mood and affect. Her behavior is normal.    ED Course  Procedures (including critical care time)  DIAGNOSTIC STUDIES: Oxygen Saturation is 96% on room air, adequate by my interpretation.    COORDINATION OF CARE:  4:43PM - pt will be d/c with Rx for amoxicillin.   Labs Reviewed - No data to display No results found.   No diagnosis found.    MDM  I personally performed the services described  in this documentation, which was scribed in my presence. The recorded information has been reviewed and considered.        Hilario Quarry, MD 01/28/12 [phone]

## 2012-01-28 NOTE — ED Notes (Signed)
Pt presents to department for evaluation of sinus/head congestion, sore throat and fever. Ongoing for several days. States no relief with OTC medications. 10/10 pain at the time. No signs of distress noted at present.

## 2012-01-28 NOTE — ED Notes (Signed)
Pt complains of fever, sore throat, not feeling well. sts she knows that it is a sinus infeciton.

## 2012-04-17 ENCOUNTER — Emergency Department (HOSPITAL_COMMUNITY)
Admission: EM | Admit: 2012-04-17 | Discharge: 2012-04-18 | Disposition: A | Discharge status: Home / Self Care | Payer: Medicaid Other | Attending: Emergency Medicine | Admitting: Emergency Medicine

## 2012-04-17 ENCOUNTER — Encounter (HOSPITAL_COMMUNITY): Payer: Self-pay | Admitting: Adult Health

## 2012-04-17 DIAGNOSIS — J45909 Unspecified asthma, uncomplicated: Secondary | ICD-10-CM | POA: Insufficient documentation

## 2012-04-17 DIAGNOSIS — R51 Headache: Secondary | ICD-10-CM | POA: Insufficient documentation

## 2012-04-17 DIAGNOSIS — Z8679 Personal history of other diseases of the circulatory system: Secondary | ICD-10-CM | POA: Insufficient documentation

## 2012-04-17 DIAGNOSIS — J029 Acute pharyngitis, unspecified: Secondary | ICD-10-CM | POA: Insufficient documentation

## 2012-04-17 DIAGNOSIS — Z79899 Other long term (current) drug therapy: Secondary | ICD-10-CM | POA: Insufficient documentation

## 2012-04-17 DIAGNOSIS — Z87891 Personal history of nicotine dependence: Secondary | ICD-10-CM | POA: Insufficient documentation

## 2012-04-17 DIAGNOSIS — R569 Unspecified convulsions: Secondary | ICD-10-CM | POA: Insufficient documentation

## 2012-04-17 HISTORY — DX: Aneurysm of unspecified site: I72.9

## 2012-04-17 LAB — CBC WITH DIFFERENTIAL/PLATELET
HCT: 37.6 % (ref 36.0–46.0)
Hemoglobin: 13.3 g/dL (ref 12.0–15.0)
Lymphocytes Relative: 48 % — ABNORMAL HIGH (ref 12–46)
Lymphs Abs: 3.4 10*3/uL (ref 0.7–4.0)
Monocytes Absolute: 0.6 10*3/uL (ref 0.1–1.0)
Monocytes Relative: 8 % (ref 3–12)
Neutro Abs: 3.1 10*3/uL (ref 1.7–7.7)
Neutrophils Relative %: 43 % (ref 43–77)
RBC: 4.19 MIL/uL (ref 3.87–5.11)

## 2012-04-17 LAB — RAPID STREP SCREEN (MED CTR MEBANE ONLY): Streptococcus, Group A Screen (Direct): NEGATIVE

## 2012-04-17 MED ORDER — LORAZEPAM 1 MG PO TABS
1.0000 mg | ORAL_TABLET | Freq: Once | ORAL | Status: AC
  Administered 2012-04-17: 1 mg via ORAL
  Filled 2012-04-17: qty 1

## 2012-04-17 MED ORDER — PREDNISONE 20 MG PO TABS
60.0000 mg | ORAL_TABLET | Freq: Once | ORAL | Status: AC
  Administered 2012-04-17: 60 mg via ORAL
  Filled 2012-04-17: qty 3

## 2012-04-17 NOTE — ED Notes (Signed)
Med given 

## 2012-04-17 NOTE — ED Notes (Signed)
reports hx of brain aneurysm. For the last 2 weeks experiencing headaches and fast heart beat. Headaches are associated with blurred vision and sensitive to sound as well as nauseated.

## 2012-04-17 NOTE — ED Notes (Signed)
The pt has had a continuous headache for 2 weeks.  She wakes up in the night time with headaches.  She had an aneursym clipped in 2002 and she has seizures since then.  She is in no acute distress at present.  She has been eating bogangles chicken since she arrived.

## 2012-04-17 NOTE — ED Provider Notes (Signed)
History     CSN: [id number]  Arrival date & time 04/17/12  2123   First MD Initiated Contact with Patient 04/17/12 2233      Chief Complaint  Patient presents with  . Headache    (Consider location/radiation/quality/duration/timing/severity/associated sxs/prior treatment) Patient is a 31 y.o. female presenting with headaches. The history is provided by the patient.  Headache   She has a history of a cerebral aneurysm which was repaired. For the last 2 weeks, she has been waking up at night with a severe, sharp left-sided headache. It is in the left lateral aerated into the retro-orbital area. It is worse with exposure to light and noise. It is associated with nausea, vomiting, and blurred vision. Headache usually lasts about a half hour before resolving. She denies rhinorrhea or lacrimation with it. She is also had a sore throat for the last 4 weeks. She denies fever, chills, sweats. Throat pain is worse with swallowing. Headache is rated at 10/10 when present it is completely gone now. Throat pain is rated at 6/10.  Past Medical History  Diagnosis Date  . Asthma   . Seizures     Stress induced seizures  . Aneurysm     Past Surgical History  Procedure Date  . Brain surgery     History reviewed. No pertinent family history.  History  Substance Use Topics  . Smoking status: Former Games developer  . Smokeless tobacco: Not on file  . Alcohol Use: No    OB History    Grav Para Term Preterm Abortions TAB SAB Ect Mult Living                  Review of Systems  Neurological: Positive for headaches.  All other systems reviewed and are negative.    Allergies  Review of patient's allergies indicates no known allergies.  Home Medications   Current Outpatient Rx  Name Route Sig Dispense Refill  . AMOXICILLIN 500 MG PO CAPS Oral Take 500 mg by mouth 3 (three) times daily.    Marland Kitchen HYDROCODONE-ACETAMINOPHEN 10-325 MG PO TABS Oral Take 1 tablet by mouth every 6 (six) hours as  needed. For tooth pain    . IBUPROFEN 800 MG PO TABS Oral Take 800 mg by mouth every 8 (eight) hours as needed. For tooth pain    . LAMOTRIGINE 100 MG PO TABS Oral Take 100 mg by mouth 2 (two) times daily.      BP 126/76  Pulse 87  Temp 98.2 F (36.8 C) (Oral)  Resp 20  SpO2 100%  Physical Exam  Nursing note and vitals reviewed. 31 year old female, resting comfortably and in no acute distress. Vital signs are normal. Oxygen saturation is 100%, which is normal. Head is normocephalic and atraumatic. PERRLA, EOMI. Oropharynx is significant for moderate to severe tonsillar hypertrophy bilaterally with faint exudate present bilaterally. Fundi show no hemorrhage, exudate, or papilledema. Neck is nontender and supple with bilateral anterior and posterior cervical adenopathy. Back is nontender and there is no CVA tenderness. Lungs are clear without rales, wheezes, or rhonchi. Chest is nontender. Heart has regular rate and rhythm without murmur. Abdomen is soft, flat, nontender without masses or hepatosplenomegaly and peristalsis is normoactive. Extremities have no cyanosis or edema, full range of motion is present. Skin is warm and dry without rash. Neurologic: Mental status is normal, cranial nerves are intact, there are no motor or sensory deficits.   ED Course  Procedures (including critical care time)  Results for  orders placed during the hospital encounter of 04/17/12  RAPID STREP SCREEN      Component Value Range   Streptococcus, Group A Screen (Direct) NEGATIVE  NEGATIVE  CBC WITH DIFFERENTIAL      Component Value Range   WBC 7.2  4.0 - 10.5 K/uL   RBC 4.19  3.87 - 5.11 MIL/uL   Hemoglobin 13.3  12.0 - 15.0 g/dL   HCT 45.4  09.8 - 11.9 %   MCV 89.7  78.0 - 100.0 fL   MCH 31.7  26.0 - 34.0 pg   MCHC 35.4  30.0 - 36.0 g/dL   RDW 14.7  82.9 - 56.2 %   Platelets 252  150 - 400 K/uL   Neutrophils Relative 43  43 - 77 %   Neutro Abs 3.1  1.7 - 7.7 K/uL   Lymphocytes Relative  48 (*) 12 - 46 %   Lymphs Abs 3.4  0.7 - 4.0 K/uL   Monocytes Relative 8  3 - 12 %   Monocytes Absolute 0.6  0.1 - 1.0 K/uL   Eosinophils Relative 1  0 - 5 %   Eosinophils Absolute 0.1  0.0 - 0.7 K/uL   Basophils Relative 0  0 - 1 %   Basophils Absolute 0.0  0.0 - 0.1 K/uL  MONONUCLEOSIS SCREEN      Component Value Range   Mono Screen NEGATIVE  NEGATIVE     1. Headache   2. Pharyngitis       MDM  Headache which sounds like migraine variant. Repetitive headache at the same time a day would be suggestive of cluster headache although she does not have the as expected limitation and rhinorrhea that's typically associated with cluster headaches. Sore throat with tonsillar hypertrophy which might represent either streptococcal pharyngitis, possible infectious mononucleosis. Strep screen and mono screen will be obtained. Also, of note, she states that she was given alprazolam for anxiety by her physician at [facility] but she states it made her dizzy and she did not like taking it. She will be given a therapeutic trial of lorazepam today. Old records are reviewed and as she has several ED visits for either seizures or auras.   Strep screen is come back negative and mono screen is negative. WBC is normal with a right shift which does suggest a viral illness. She is sent home with prescription for prednisone. She tolerated lorazepam well for anxiety and she is sent home with prescription for lorazepam. She is referred to ENT for followup of persistent tonsillitis.     Dione Booze, MD 04/18/12 [phone]

## 2012-04-18 LAB — STREP A DNA PROBE: Group A Strep Probe: NEGATIVE

## 2012-04-18 MED ORDER — LORAZEPAM 1 MG PO TABS: 1.0000 mg | ORAL_TABLET | Freq: Three times a day (TID) | ORAL | Status: DC | PRN

## 2012-04-18 MED ORDER — PREDNISONE 50 MG PO TABS: 50.0000 mg | ORAL_TABLET | Freq: Every day | ORAL | Status: DC

## 2012-05-26 ENCOUNTER — Encounter (HOSPITAL_COMMUNITY): Payer: Self-pay | Admitting: Emergency Medicine

## 2012-05-26 ENCOUNTER — Emergency Department (HOSPITAL_COMMUNITY)
Admission: EM | Admit: 2012-05-26 | Discharge: 2012-05-26 | Discharge status: Against Medical Advice | Payer: Medicaid Other | Attending: Emergency Medicine | Admitting: Emergency Medicine

## 2012-05-26 DIAGNOSIS — K5900 Constipation, unspecified: Secondary | ICD-10-CM | POA: Insufficient documentation

## 2012-05-26 DIAGNOSIS — J029 Acute pharyngitis, unspecified: Secondary | ICD-10-CM | POA: Insufficient documentation

## 2012-05-26 DIAGNOSIS — M79609 Pain in unspecified limb: Secondary | ICD-10-CM | POA: Insufficient documentation

## 2012-05-26 NOTE — ED Notes (Signed)
Pt is s/p tonsilectomy, c/o sore throat, constipation and right calf pain (no trauma), NAD

## 2012-05-26 NOTE — ED Notes (Signed)
The pt told registration  That she was leaving she had waited too long

## 2012-05-26 NOTE — ED Notes (Signed)
No answer x1

## 2012-06-20 ENCOUNTER — Encounter (HOSPITAL_COMMUNITY): Payer: Self-pay | Admitting: *Deleted

## 2012-06-20 ENCOUNTER — Emergency Department (HOSPITAL_COMMUNITY)
Admission: EM | Admit: 2012-06-20 | Discharge: 2012-06-21 | Disposition: A | Discharge status: Home / Self Care | Payer: Medicaid Other | Attending: Emergency Medicine | Admitting: Emergency Medicine

## 2012-06-20 DIAGNOSIS — Z8679 Personal history of other diseases of the circulatory system: Secondary | ICD-10-CM | POA: Insufficient documentation

## 2012-06-20 DIAGNOSIS — G40909 Epilepsy, unspecified, not intractable, without status epilepticus: Secondary | ICD-10-CM | POA: Insufficient documentation

## 2012-06-20 DIAGNOSIS — F419 Anxiety disorder, unspecified: Secondary | ICD-10-CM

## 2012-06-20 DIAGNOSIS — F411 Generalized anxiety disorder: Secondary | ICD-10-CM | POA: Insufficient documentation

## 2012-06-20 DIAGNOSIS — Z87891 Personal history of nicotine dependence: Secondary | ICD-10-CM | POA: Insufficient documentation

## 2012-06-20 DIAGNOSIS — F410 Panic disorder [episodic paroxysmal anxiety] without agoraphobia: Secondary | ICD-10-CM | POA: Insufficient documentation

## 2012-06-20 DIAGNOSIS — Z79899 Other long term (current) drug therapy: Secondary | ICD-10-CM | POA: Insufficient documentation

## 2012-06-20 DIAGNOSIS — J45909 Unspecified asthma, uncomplicated: Secondary | ICD-10-CM | POA: Insufficient documentation

## 2012-06-20 HISTORY — DX: Panic disorder (episodic paroxysmal anxiety): F41.0

## 2012-06-20 MED ORDER — LORAZEPAM 1 MG PO TABS
2.0000 mg | ORAL_TABLET | Freq: Once | ORAL | Status: AC
  Administered 2012-06-20: 2 mg via ORAL
  Filled 2012-06-20: qty 2

## 2012-06-20 NOTE — ED Provider Notes (Signed)
History     CSN: [id number]  Arrival date & time 06/20/12  2145   First MD Initiated Contact with Patient 06/20/12 2259      Chief Complaint  Patient presents with  . Seizures  . Panic Attack     The history is provided by the patient and medical records (Care Everywhere: [facility] Medical Record).   patient believes she may have had a seizure this evening.  This was unwitnessed.  She had no tongue biting or urinary incontinence.  At this time she denies weakness of her upper lower extremities.  She also reports increasing anxiety lately and she states that the majority of her "seizures" are related to increased stress and anxiety.  She is currently following with a psychiatrist as an outpatient and was recently prescribed hydroxyzine but she states she doesn't like the side effects.  She's on Lamictal for seizures and states compliance with this.  She also has Lexapro that she takes daily.  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  She has a history of documented partial seizures on EEG previously but her most recent EEG per [facility] medical record demonstrates non-epileptic events.  Patient denies fevers or chills.  She denies shortness of breath.  She denies recent fevers or chills.  She has no neck stiffness.  She states increased stress and anxiety lately.  She is his other medications are not helping her.   Past Medical History  Diagnosis Date  . Asthma   . Seizures     Stress induced seizures  . Aneurysm   . Panic attack     Past Surgical History  Procedure Date  . Brain surgery   . Tonsillectomy   . Aneurysm coiling     History reviewed. No pertinent family history.  History  Substance Use Topics  . Smoking status: Former Games developer  . Smokeless tobacco: Not on file  . Alcohol Use: No    OB History    Grav Para Term Preterm Abortions TAB SAB Ect Mult Living                  Review of Systems  Neurological:  Positive for seizures.  All other systems reviewed and are negative.    Allergies  Hydroxacen  Home Medications   Current Outpatient Rx  Name  Route  Sig  Dispense  Refill  . ESCITALOPRAM OXALATE 10 MG PO TABS   Oral   Take 10 mg by mouth daily.         Marland Kitchen HYDROXYZINE HCL 25 MG PO TABS   Oral   Take 25 mg by mouth 3 (three) times daily as needed.         Marland Kitchen LAMOTRIGINE 100 MG PO TABS   Oral   Take 100 mg by mouth 2 (two) times daily.           BP 119/76  Pulse 87  Temp 98.5 F (36.9 C) (Oral)  Resp 22  SpO2 99%  Physical Exam  Nursing note and vitals reviewed. Constitutional: She is oriented to person, place, and time. She appears well-developed and well-nourished. No distress.  HENT:  Head: Normocephalic and atraumatic.  Eyes: EOM are normal. Pupils are equal, round, and reactive to light.  Neck: Normal range of motion.  Cardiovascular: Normal rate, regular rhythm and normal heart sounds.   Pulmonary/Chest: Effort normal and breath sounds normal.  Abdominal: Soft. She exhibits no distension. There is  no tenderness.  Musculoskeletal: Normal range of motion.  Neurological: She is alert and oriented to person, place, and time.       5/5 strength in major muscle groups of  bilateral upper and lower extremities. Speech normal. No facial asymetry.   Skin: Skin is warm and dry.  Psychiatric:       Tearful. Anxious appearing    ED Course  Procedures (including critical care time)  Labs Reviewed - No data to display No results found.   1. 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  The patient is tearful and seems anxious.  Milligrams Ativan given with improvement in her symptoms.  Normal neurologic exam.  No indication for imaging.  No headache at this time.  From [facility] RECORD NUMBER Telephone Encounter - Elyn Peers, Noberto Retort, MD - 06/17/2012 10:08 PM EST Ms. Sterry is a known patient of Dr. Sherlean Foot. She has a diagnosis  of complex partial seizures at this time with a prolonged EEG in May of this year (2013) showing non-epileptic correlates to her clinical events, which were proposed to be anxiety related at that time. This diagnostic discussion was had re: EEG results in May during her admission.  This evening, she is calling again to speak to someone because she still feels like her "seizure medication is not working." She was seen in the ED in [place] last night due to feelings of anxiety such as a fast heart rate, blurry vision and feeling shaky all over. She reports that her seizures typically involve similar feelings but usually only last 5 minutes or so. This episode yesterday lasted about 4 hours. She reports being awake with no LOC and she was breathing into a bag the whole time to calm herself down. We discussed that this episode was not likely to be a seizure based on her symptoms and the length of the event. We also talked about her ongoing issues related to anxiety and how they would best be managed. She plans to see a psychiatrist on Thursday of this week. She hopes to start a daily anti-anxiolytic medication that will help her with her panic attacks.   Re: her seizure medications, her lamotrigine was recently increased from 100mg  BID to 150mg  BID on 06/06/12. We discussed that this medication was working, as she had not had a real seizure episode and I mentioned that since it was recently increased, I did not want to raise the dose again at this time. She is scheduled for a follow-up appointment with Dr. Sherlean Foot on January 13th. I mentioned that she should discuss these issues at her outpatient appointment and if any other clinical seizures happen between now and this appointment, she should call the neurology on-call pager and seek medical attention if she is concerned.    Lyanne Co, MD 06/20/12 [phone]

## 2012-06-20 NOTE — ED Notes (Signed)
Dr. Campos at bedside   

## 2012-06-20 NOTE — ED Notes (Signed)
Pt states she had seizure 30 min ago, not witnessed.  Also requesting paper bag for panic attack.

## 2012-09-10 ENCOUNTER — Emergency Department (HOSPITAL_BASED_OUTPATIENT_CLINIC_OR_DEPARTMENT_OTHER): Payer: No Typology Code available for payment source

## 2012-09-10 ENCOUNTER — Encounter (HOSPITAL_BASED_OUTPATIENT_CLINIC_OR_DEPARTMENT_OTHER): Payer: Self-pay | Admitting: *Deleted

## 2012-09-10 ENCOUNTER — Emergency Department (HOSPITAL_BASED_OUTPATIENT_CLINIC_OR_DEPARTMENT_OTHER)
Admission: EM | Admit: 2012-09-10 | Discharge: 2012-09-10 | Disposition: A | Discharge status: Home / Self Care | Payer: No Typology Code available for payment source | Attending: Emergency Medicine | Admitting: Emergency Medicine

## 2012-09-10 DIAGNOSIS — F410 Panic disorder [episodic paroxysmal anxiety] without agoraphobia: Secondary | ICD-10-CM | POA: Insufficient documentation

## 2012-09-10 DIAGNOSIS — Z8679 Personal history of other diseases of the circulatory system: Secondary | ICD-10-CM | POA: Insufficient documentation

## 2012-09-10 DIAGNOSIS — Y9389 Activity, other specified: Secondary | ICD-10-CM | POA: Insufficient documentation

## 2012-09-10 DIAGNOSIS — S298XXA Other specified injuries of thorax, initial encounter: Secondary | ICD-10-CM | POA: Insufficient documentation

## 2012-09-10 DIAGNOSIS — S139XXA Sprain of joints and ligaments of unspecified parts of neck, initial encounter: Secondary | ICD-10-CM | POA: Insufficient documentation

## 2012-09-10 DIAGNOSIS — J45901 Unspecified asthma with (acute) exacerbation: Secondary | ICD-10-CM | POA: Insufficient documentation

## 2012-09-10 DIAGNOSIS — S161XXA Strain of muscle, fascia and tendon at neck level, initial encounter: Secondary | ICD-10-CM

## 2012-09-10 DIAGNOSIS — Z79899 Other long term (current) drug therapy: Secondary | ICD-10-CM | POA: Insufficient documentation

## 2012-09-10 DIAGNOSIS — Y92410 Unspecified street and highway as the place of occurrence of the external cause: Secondary | ICD-10-CM | POA: Insufficient documentation

## 2012-09-10 DIAGNOSIS — F17200 Nicotine dependence, unspecified, uncomplicated: Secondary | ICD-10-CM | POA: Insufficient documentation

## 2012-09-10 DIAGNOSIS — R0789 Other chest pain: Secondary | ICD-10-CM

## 2012-09-10 DIAGNOSIS — G40909 Epilepsy, unspecified, not intractable, without status epilepticus: Secondary | ICD-10-CM | POA: Insufficient documentation

## 2012-09-10 MED ORDER — OXYCODONE-ACETAMINOPHEN 5-325 MG PO TABS
1.0000 | ORAL_TABLET | Freq: Once | ORAL | Status: AC
  Administered 2012-09-10: 1 via ORAL
  Filled 2012-09-10 (×2): qty 1

## 2012-09-10 MED ORDER — OXYCODONE-ACETAMINOPHEN 5-325 MG PO TABS: 1.0000 | ORAL_TABLET | Freq: Four times a day (QID) | ORAL | Status: DC | PRN
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
## 2012-09-10 NOTE — ED Notes (Signed)
EKG given to EDP Plunkett to review with old for comparison
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
## 2012-09-12 ENCOUNTER — Emergency Department (HOSPITAL_BASED_OUTPATIENT_CLINIC_OR_DEPARTMENT_OTHER)
Admission: EM | Admit: 2012-09-12 | Discharge: 2012-09-12 | Disposition: A | Discharge status: Home / Self Care | Payer: No Typology Code available for payment source | Attending: Emergency Medicine | Admitting: Emergency Medicine

## 2012-09-12 ENCOUNTER — Encounter (HOSPITAL_COMMUNITY): Payer: Self-pay | Admitting: Emergency Medicine

## 2012-09-12 ENCOUNTER — Emergency Department (HOSPITAL_COMMUNITY)
Admission: EM | Admit: 2012-09-12 | Discharge: 2012-09-12 | Discharge status: Against Medical Advice | Payer: No Typology Code available for payment source | Attending: Emergency Medicine | Admitting: Emergency Medicine

## 2012-09-12 ENCOUNTER — Encounter (HOSPITAL_BASED_OUTPATIENT_CLINIC_OR_DEPARTMENT_OTHER): Payer: Self-pay | Admitting: *Deleted

## 2012-09-12 DIAGNOSIS — T148XXA Other injury of unspecified body region, initial encounter: Secondary | ICD-10-CM | POA: Insufficient documentation

## 2012-09-12 DIAGNOSIS — Y939 Activity, unspecified: Secondary | ICD-10-CM | POA: Insufficient documentation

## 2012-09-12 DIAGNOSIS — F17200 Nicotine dependence, unspecified, uncomplicated: Secondary | ICD-10-CM | POA: Insufficient documentation

## 2012-09-12 DIAGNOSIS — J45909 Unspecified asthma, uncomplicated: Secondary | ICD-10-CM | POA: Insufficient documentation

## 2012-09-12 DIAGNOSIS — F410 Panic disorder [episodic paroxysmal anxiety] without agoraphobia: Secondary | ICD-10-CM | POA: Insufficient documentation

## 2012-09-12 DIAGNOSIS — Z79899 Other long term (current) drug therapy: Secondary | ICD-10-CM | POA: Insufficient documentation

## 2012-09-12 DIAGNOSIS — R0789 Other chest pain: Secondary | ICD-10-CM | POA: Insufficient documentation

## 2012-09-12 DIAGNOSIS — R079 Chest pain, unspecified: Secondary | ICD-10-CM | POA: Insufficient documentation

## 2012-09-12 DIAGNOSIS — Z8679 Personal history of other diseases of the circulatory system: Secondary | ICD-10-CM | POA: Insufficient documentation

## 2012-09-12 DIAGNOSIS — IMO0001 Reserved for inherently not codable concepts without codable children: Secondary | ICD-10-CM | POA: Insufficient documentation

## 2012-09-12 DIAGNOSIS — G40909 Epilepsy, unspecified, not intractable, without status epilepticus: Secondary | ICD-10-CM | POA: Insufficient documentation

## 2012-09-12 DIAGNOSIS — Y92410 Unspecified street and highway as the place of occurrence of the external cause: Secondary | ICD-10-CM | POA: Insufficient documentation

## 2012-09-12 DIAGNOSIS — Z5321 Procedure and treatment not carried out due to patient leaving prior to being seen by health care provider: Secondary | ICD-10-CM

## 2012-09-12 MED ORDER — NAPROXEN 500 MG PO TABS: 500.0000 mg | ORAL_TABLET | Freq: Two times a day (BID) | ORAL | Status: DC

## 2012-09-12 MED ORDER — CYCLOBENZAPRINE HCL 5 MG PO TABS: 5.0000 mg | ORAL_TABLET | Freq: Three times a day (TID) | ORAL | Status: DC | PRN

## 2012-09-12 MED ORDER — HYDROCODONE-ACETAMINOPHEN 5-325 MG PO TABS: 1.0000 | ORAL_TABLET | Freq: Four times a day (QID) | ORAL | Status: DC | PRN

## 2012-09-12 NOTE — ED Notes (Signed)
Pt states she is not waiting anymore, informed her the doctor would be in soon, pt refused to wait, walked out of ER.

## 2012-09-12 NOTE — ED Provider Notes (Signed)
Patient left without being seen post triage.  Jimmye Norman, NP 09/12/12 1956
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
## 2012-09-13 NOTE — ED Provider Notes (Signed)
Medical screening examination/treatment/procedure(s) were performed by non-physician practitioner and as supervising physician I was immediately available for consultation/collaboration.  Anabia Weatherwax, MD 09/13/12 1546 

## 2012-10-23 ENCOUNTER — Encounter (HOSPITAL_COMMUNITY): Payer: Self-pay | Admitting: Emergency Medicine

## 2012-10-23 ENCOUNTER — Emergency Department (HOSPITAL_COMMUNITY)
Admission: EM | Admit: 2012-10-23 | Discharge: 2012-10-23 | Disposition: A | Discharge status: Home / Self Care | Payer: Medicaid Other | Attending: Emergency Medicine | Admitting: Emergency Medicine

## 2012-10-23 DIAGNOSIS — F17200 Nicotine dependence, unspecified, uncomplicated: Secondary | ICD-10-CM | POA: Insufficient documentation

## 2012-10-23 DIAGNOSIS — Z87898 Personal history of other specified conditions: Secondary | ICD-10-CM

## 2012-10-23 DIAGNOSIS — Z79899 Other long term (current) drug therapy: Secondary | ICD-10-CM | POA: Insufficient documentation

## 2012-10-23 DIAGNOSIS — M2550 Pain in unspecified joint: Secondary | ICD-10-CM | POA: Insufficient documentation

## 2012-10-23 DIAGNOSIS — B9789 Other viral agents as the cause of diseases classified elsewhere: Secondary | ICD-10-CM | POA: Insufficient documentation

## 2012-10-23 DIAGNOSIS — J45909 Unspecified asthma, uncomplicated: Secondary | ICD-10-CM | POA: Insufficient documentation

## 2012-10-23 DIAGNOSIS — R509 Fever, unspecified: Secondary | ICD-10-CM | POA: Insufficient documentation

## 2012-10-23 DIAGNOSIS — J3489 Other specified disorders of nose and nasal sinuses: Secondary | ICD-10-CM | POA: Insufficient documentation

## 2012-10-23 DIAGNOSIS — R05 Cough: Secondary | ICD-10-CM | POA: Insufficient documentation

## 2012-10-23 DIAGNOSIS — J069 Acute upper respiratory infection, unspecified: Secondary | ICD-10-CM

## 2012-10-23 DIAGNOSIS — G40909 Epilepsy, unspecified, not intractable, without status epilepticus: Secondary | ICD-10-CM | POA: Insufficient documentation

## 2012-10-23 DIAGNOSIS — J329 Chronic sinusitis, unspecified: Secondary | ICD-10-CM | POA: Insufficient documentation

## 2012-10-23 DIAGNOSIS — R52 Pain, unspecified: Secondary | ICD-10-CM | POA: Insufficient documentation

## 2012-10-23 DIAGNOSIS — B349 Viral infection, unspecified: Secondary | ICD-10-CM

## 2012-10-23 DIAGNOSIS — R059 Cough, unspecified: Secondary | ICD-10-CM | POA: Insufficient documentation

## 2012-10-23 LAB — RAPID STREP SCREEN (MED CTR MEBANE ONLY): Streptococcus, Group A Screen (Direct): NEGATIVE

## 2012-10-23 MED ORDER — PENICILLIN V POTASSIUM 500 MG PO TABS: 500.0000 mg | ORAL_TABLET | Freq: Four times a day (QID) | ORAL | Status: AC

## 2012-10-23 NOTE — ED Notes (Signed)
Pt here for body aches and sore throat x several days; pt sts some fever

## 2012-10-23 NOTE — ED Notes (Signed)
Pt states that she does suffer from Migraine headaches, having some blurry vision

## 2012-10-23 NOTE — ED Provider Notes (Signed)
History     CSN: [id number]  Arrival date & time 10/23/12  4098   First MD Initiated Contact with Patient 10/23/12 1026      Chief Complaint  Patient presents with  . Sore Throat  . Generalized Body Aches    (Consider location/radiation/quality/duration/timing/severity/associated sxs/prior treatment) HPI Comments: Hannah Cook is a 32 y/o F with PMHx of asthma, panic attackes, seizures (last one in September 2013), aneurysm coiling, presenting to the ED with sore throat, headache, cough, congestion, body aches since Monday. Patient reported that she has a had a headache everyday since Monday, described as a constant throbbing sensation. Stated that she has had bodyaches that are all over and are intermittent throughout the day. Patient reported that she has been having feelings of facial pressure that has been ongoing - similar to her sinusitis symptoms that she gets. Stated that she has had a cough with yellowish phlegm and congestion with yellowish rhinorrhea. Patient reported having mild sore throat, more of a dryness, that results in mild discomfort when swallowing. Mild difficulty breathing due to history of asthma and congestion. Fever reported to be 101 last night - no fever recorded today. Denied chills, chest pain, shortness of breathe, gi symptoms, urinary symptoms.   The history is provided by the patient. No language interpreter was used.    Past Medical History  Diagnosis Date  . Asthma   . Aneurysm   . Panic attack   . 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 Brain surgery    . Tonsillectomy    . Aneurysm coiling      History reviewed. No pertinent family history.  History  Substance Use Topics  . Smoking status: Current Every Day Smoker  . Smokeless tobacco: Never Used  . Alcohol Use: No    OB History   Grav Para Term Preterm Abortions TAB SAB Ect Mult Living                   Review of Systems  Constitutional: Positive for fever. Negative for chills, diaphoresis and fatigue.  HENT: Positive for congestion, sore throat and rhinorrhea. Negative for sneezing, neck pain, neck stiffness and postnasal drip.   Eyes: Negative for photophobia, pain and visual disturbance.  Respiratory: Positive for cough. Negative for chest tightness, shortness of breath and wheezing.   Cardiovascular: Negative for chest pain.  Gastrointestinal: Negative for nausea, vomiting, abdominal pain, diarrhea and constipation.  Genitourinary: Negative for decreased urine volume and difficulty urinating.  Musculoskeletal: Positive for arthralgias.       Bodyaches   Skin: Negative for rash.  All other systems reviewed and are negative.    Allergies  Hydroxacen and Vicodin  Home Medications   Current Outpatient Rx  Name  Route  Sig  Dispense  Refill  . lamoTRIgine (LAMICTAL) 150 MG tablet   Oral   Take 75-150 mg by mouth 2 (two) times daily.          Marland Kitchen PRENATAL VITAMINS PO   Oral   Take 1 tablet by mouth daily with breakfast.         . ALPRAZolam (XANAX) 1 MG tablet   Oral   Take 1 mg by mouth daily as needed. For anxiety         . penicillin v potassium (VEETID) 500 MG tablet   Oral   Take 1 tablet (500 mg total) by  mouth 4 (four) times daily.   40 tablet   0     BP 107/74  Pulse 92  Temp(Src) 98.3 F (36.8 C) (Oral)  Resp 16  SpO2 97%  Physical Exam  Nursing note and vitals reviewed. Constitutional: She is oriented to person, place, and time. She appears well-developed and well-nourished. No distress.  HENT:  Head: Normocephalic and atraumatic.  Mouth/Throat: Oropharynx is clear and moist. No oropharyngeal exudate.  Negative erythema to posterior oropharynx Negative exudate to tonsils Negative enlargement of tonsils Negative petechiae  Facial pressure felt when patient leaned head forward  Eyes: Conjunctivae and EOM are normal. Pupils are equal,  round, and reactive to light. Right eye exhibits no discharge. Left eye exhibits no discharge.  Neck: Normal range of motion. Neck supple. No tracheal deviation present.  Negative nuchal rigidity Negative neck stiffness Negative lymphadenopathy  Cardiovascular: Normal rate, regular rhythm and normal heart sounds.  Exam reveals no friction rub.   No murmur heard. Radial pulses 2+ bilaterally Pedal pulses 2+ bilaterally Negative leg and ankle swelling Negative pitting edema   Pulmonary/Chest: Effort normal and breath sounds normal. No respiratory distress. She has no wheezes. She has no rales. She exhibits no tenderness.  Musculoskeletal: Normal range of motion. She exhibits no edema and no tenderness.  Full ROM Strength 5+/5+ to lower extremities bilaterally  Lymphadenopathy:    She has no cervical adenopathy.  Neurological: She is alert and oriented to person, place, and time. No cranial nerve deficit. She exhibits normal muscle tone. Coordination normal.  Skin: Skin is warm and dry. No rash noted. She is not diaphoretic. No erythema.  Psychiatric: She has a normal mood and affect. Her behavior is normal. Thought content normal.    ED Course  Procedures (including critical care time)  Labs Reviewed  RAPID STREP SCREEN   No results found.   1. Viral illness   2. URI (upper respiratory infection)   3. Asthma   4. History of seizures   5. Sinusitis       MDM  Patient afebrile, normotensive, non-tachycardic, alert and oriented. Lungs clear to auscultation. Suspected URI, viral illness in nature. Patient aseptic, non-toxic appearing, in no acute distress. Discharged patient. Discussed viral infections to patient and supportive therapy, beginnings of sinusitis. Discharged patient with antibiotics. Discussed to follow-up with PCP and neurology physician to be re-evaluated within 24-48 hours after starting antibiotic therpay. Discussed to continue at home medications as prescribed.  Discussed to monitor symptoms and if symptoms worsen or change to report back to the ED. Patient agreed to plan of care, understood, all questions answered.         Raymon Mutton, PA-C 10/23/12 1743

## 2012-10-24 NOTE — ED Provider Notes (Signed)
Medical screening examination/treatment/procedure(s) were performed by non-physician practitioner and as supervising physician I was immediately available for consultation/collaboration.  Juliet Rude. Rubin Payor, MD 10/24/12 [phone]

## 2013-03-11 ENCOUNTER — Encounter (HOSPITAL_COMMUNITY): Payer: Self-pay | Admitting: Emergency Medicine

## 2013-03-11 ENCOUNTER — Emergency Department (INDEPENDENT_AMBULATORY_CARE_PROVIDER_SITE_OTHER)
Admission: EM | Admit: 2013-03-11 | Discharge: 2013-03-11 | Disposition: A | Discharge status: Home / Self Care | Payer: Self-pay | Source: Home / Self Care

## 2013-03-11 DIAGNOSIS — S20212A Contusion of left front wall of thorax, initial encounter: Secondary | ICD-10-CM

## 2013-03-11 DIAGNOSIS — IMO0002 Reserved for concepts with insufficient information to code with codable children: Secondary | ICD-10-CM

## 2013-03-11 DIAGNOSIS — S0003XA Contusion of scalp, initial encounter: Secondary | ICD-10-CM

## 2013-03-11 DIAGNOSIS — S20219A Contusion of unspecified front wall of thorax, initial encounter: Secondary | ICD-10-CM

## 2013-03-11 DIAGNOSIS — S0093XA Contusion of unspecified part of head, initial encounter: Secondary | ICD-10-CM

## 2013-03-11 MED ORDER — ACETAMINOPHEN 325 MG PO TABS
650.0000 mg | ORAL_TABLET | Freq: Once | ORAL | Status: AC
  Administered 2013-03-11: 650 mg via ORAL

## 2013-03-11 MED ORDER — ACETAMINOPHEN 325 MG PO TABS
ORAL_TABLET | ORAL | Status: AC
  Filled 2013-03-11: qty 2
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
## 2013-03-11 NOTE — ED Provider Notes (Signed)
Medical screening examination/treatment/procedure(s) were performed by resident physician or non-physician practitioner and as supervising physician I was immediately available for consultation/collaboration.   Nathali Vent DOUGLAS MD.   Gwynevere Lizana D Issacc Merlo, MD 03/11/13 2045 

## 2013-03-13 ENCOUNTER — Emergency Department (HOSPITAL_COMMUNITY): Payer: No Typology Code available for payment source

## 2013-03-13 ENCOUNTER — Encounter (HOSPITAL_COMMUNITY): Payer: Self-pay | Admitting: Pediatric Emergency Medicine

## 2013-03-13 ENCOUNTER — Emergency Department (HOSPITAL_COMMUNITY)
Admission: EM | Admit: 2013-03-13 | Discharge: 2013-03-14 | Disposition: A | Discharge status: Home / Self Care | Payer: No Typology Code available for payment source | Attending: Emergency Medicine | Admitting: Emergency Medicine

## 2013-03-13 DIAGNOSIS — Z09 Encounter for follow-up examination after completed treatment for conditions other than malignant neoplasm: Secondary | ICD-10-CM | POA: Diagnosis present

## 2013-03-13 DIAGNOSIS — F17200 Nicotine dependence, unspecified, uncomplicated: Secondary | ICD-10-CM | POA: Diagnosis not present

## 2013-03-13 DIAGNOSIS — Z8679 Personal history of other diseases of the circulatory system: Secondary | ICD-10-CM | POA: Insufficient documentation

## 2013-03-13 DIAGNOSIS — J45909 Unspecified asthma, uncomplicated: Secondary | ICD-10-CM | POA: Diagnosis not present

## 2013-03-13 DIAGNOSIS — F410 Panic disorder [episodic paroxysmal anxiety] without agoraphobia: Secondary | ICD-10-CM | POA: Diagnosis not present

## 2013-03-13 DIAGNOSIS — G40909 Epilepsy, unspecified, not intractable, without status epilepticus: Secondary | ICD-10-CM | POA: Diagnosis not present

## 2013-03-13 DIAGNOSIS — Z79899 Other long term (current) drug therapy: Secondary | ICD-10-CM | POA: Insufficient documentation
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
## 2013-03-13 NOTE — ED Notes (Signed)
Pt. is at pediatric ED with her daughters .

## 2013-03-14 MED ORDER — OXYCODONE-ACETAMINOPHEN 5-325 MG PO TABS: 1.0000 | ORAL_TABLET | Freq: Four times a day (QID) | ORAL | Status: DC | PRN

## 2013-03-14 MED ORDER — IBUPROFEN 600 MG PO TABS: 600.0000 mg | ORAL_TABLET | Freq: Four times a day (QID) | ORAL | Status: DC | PRN
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
## 2013-03-14 NOTE — ED Provider Notes (Signed)
Medical screening examination/treatment/procedure(s) were performed by non-physician practitioner and as supervising physician I was immediately available for consultation/collaboration.   Wendi Maya, MD 03/14/13 1406

## 2013-04-30 ENCOUNTER — Encounter: Payer: Self-pay | Admitting: *Deleted

## 2013-05-11 ENCOUNTER — Encounter (HOSPITAL_COMMUNITY): Payer: Self-pay | Admitting: Emergency Medicine

## 2013-05-11 ENCOUNTER — Emergency Department (HOSPITAL_COMMUNITY)
Admission: EM | Admit: 2013-05-11 | Discharge: 2013-05-11 | Disposition: A | Discharge status: Home / Self Care | Payer: Medicaid Other | Attending: Emergency Medicine | Admitting: Emergency Medicine

## 2013-05-11 DIAGNOSIS — J45909 Unspecified asthma, uncomplicated: Secondary | ICD-10-CM | POA: Insufficient documentation

## 2013-05-11 DIAGNOSIS — M25561 Pain in right knee: Secondary | ICD-10-CM

## 2013-05-11 DIAGNOSIS — Z8679 Personal history of other diseases of the circulatory system: Secondary | ICD-10-CM | POA: Insufficient documentation

## 2013-05-11 DIAGNOSIS — O99340 Other mental disorders complicating pregnancy, unspecified trimester: Secondary | ICD-10-CM | POA: Insufficient documentation

## 2013-05-11 DIAGNOSIS — O9989 Other specified diseases and conditions complicating pregnancy, childbirth and the puerperium: Secondary | ICD-10-CM | POA: Insufficient documentation

## 2013-05-11 DIAGNOSIS — R209 Unspecified disturbances of skin sensation: Secondary | ICD-10-CM | POA: Insufficient documentation

## 2013-05-11 DIAGNOSIS — Y9389 Activity, other specified: Secondary | ICD-10-CM | POA: Insufficient documentation

## 2013-05-11 DIAGNOSIS — S8990XA Unspecified injury of unspecified lower leg, initial encounter: Secondary | ICD-10-CM | POA: Insufficient documentation

## 2013-05-11 DIAGNOSIS — G40909 Epilepsy, unspecified, not intractable, without status epilepticus: Secondary | ICD-10-CM | POA: Insufficient documentation

## 2013-05-11 DIAGNOSIS — Y929 Unspecified place or not applicable: Secondary | ICD-10-CM | POA: Insufficient documentation

## 2013-05-11 DIAGNOSIS — Z9889 Other specified postprocedural states: Secondary | ICD-10-CM | POA: Insufficient documentation

## 2013-05-11 DIAGNOSIS — W010XXA Fall on same level from slipping, tripping and stumbling without subsequent striking against object, initial encounter: Secondary | ICD-10-CM | POA: Insufficient documentation

## 2013-05-11 DIAGNOSIS — M6281 Muscle weakness (generalized): Secondary | ICD-10-CM | POA: Insufficient documentation

## 2013-05-11 DIAGNOSIS — O99330 Smoking (tobacco) complicating pregnancy, unspecified trimester: Secondary | ICD-10-CM | POA: Insufficient documentation

## 2013-05-11 DIAGNOSIS — Z79899 Other long term (current) drug therapy: Secondary | ICD-10-CM | POA: Insufficient documentation

## 2013-05-11 DIAGNOSIS — F410 Panic disorder [episodic paroxysmal anxiety] without agoraphobia: Secondary | ICD-10-CM | POA: Insufficient documentation

## 2013-05-11 MED ORDER — ACETAMINOPHEN 325 MG PO TABS
650.0000 mg | ORAL_TABLET | Freq: Once | ORAL | Status: AC
  Administered 2013-05-11: 650 mg via ORAL
  Filled 2013-05-11: qty 2
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
## 2013-05-11 NOTE — ED Provider Notes (Signed)
CSN: [id number]     Arrival date & time 05/11/13  1418 History  This chart was scribed for non-physician practitioner Trixie Dredge, PA-C, working with Shon Baton, MD by Dorothey Baseman, ED Scribe. This patient was seen in room TR08C/TR08C and the patient's care was started at 3:44 PM.    Chief Complaint  Patient presents with  . Knee Injury   The history is provided by the patient. No language interpreter was used.   HPI Comments: Hannah Cook is a 32 y.o. female who presents to the Emergency Department complaining of a constant pain to the right knee onset 3 days ago after she states that she fell while playing with her niece and landed on her knee. She denies hitting her head or syncope. She reports that the pain has been progressively worsening and is exacerbated when pressure is applied and with extension. She denies taking any medications at home to treat her symptoms. She denies numbness. 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 Patient is currently 6-[redacted] weeks pregnant.   Past Medical History  Diagnosis Date  . Asthma   . Aneurysm   . Panic attack   . 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 Brain surgery    . Tonsillectomy    . Aneurysm coiling     No family history on file. History  Substance Use Topics  . Smoking status: Current Every Day Smoker  . Smokeless tobacco: Never Used  . Alcohol Use: No   OB History   Grav Para Term Preterm Abortions TAB SAB Ect Mult Living   1              Review of Systems  Constitutional: Negative for fever.  Musculoskeletal: Positive for arthralgias.  Neurological: Negative for syncope and numbness.    Allergies  Hydroxacen and Vicodin  Home Medications   Current Outpatient Rx  Name  Route  Sig  Dispense  Refill  . ALPRAZolam (XANAX) 1 MG tablet    Oral   Take 1 mg by mouth at bedtime as needed for sleep. For anxiety         . lamoTRIgine (LAMICTAL) 150 MG tablet   Oral   Take 150 mg by mouth 2 (two) times daily.         . Prenatal Vit-Fe Fumarate-FA (MULTIVITAMIN-PRENATAL) 27-0.8 MG TABS tablet   Oral   Take 1 tablet by mouth daily at 12 noon.          Triage Vitals: BP 111/73  Pulse 97  Temp(Src) 98.2 F (36.8 C) (Oral)  Resp 18  Wt 180 lb (81.647 kg)  SpO2 94%  LMP 03/22/2013  Physical Exam  Nursing note and vitals reviewed. Constitutional: She appears well-developed and well-nourished. No distress.  HENT:  Head: Normocephalic and atraumatic.  Neck: Neck supple.  Pulmonary/Chest: Effort normal.  Musculoskeletal:       Right knee: She exhibits decreased range of motion. She exhibits no swelling, no effusion, no ecchymosis, no deformity, no laceration, no erythema, normal alignment, no LCL laxity and no MCL laxity. Tenderness found. Lateral joint line tenderness noted.       Legs: Right knee without laxity.  No skin discoloration.  Distal pulses intact.  Gait is limping.    Neurological: She is alert.  Skin: She is not  diaphoretic.    ED Course  Procedures (including critical care time)  DIAGNOSTIC STUDIES: Oxygen Saturation is 94% on room air, adequate by my interpretation.    COORDINATION OF CARE: 3:51 PM- Advised patient to follow RICE procedures and to take Tylenol at home to manage symptoms. Will discharge patient with a knee sleeve. Discussed treatment plan with patient at bedside and patient verbalized agreement.     Labs Review Labs Reviewed - No data to display Imaging Review No results found.  EKG Interpretation   None       MDM   1. Right knee pain     Pt with right knee pain after tripping and falling on it 3 days ago.  Significant other is massaging it when we enter the room, without causing any pain.  No bony tenderness or skin changes.  No need for imaging at this time (pt  also pregnant and does not want any imaging done if not necessary). D/C home with pcp follow up, knee sleeve, tylenol for pain.  Discussed safety with medications in pregnancy. Pt declines crutches.  Discussed  findings, treatment, and follow up  with patient.  Pt given return precautions.  Pt verbalizes understanding and agrees with plan.      I personally performed the services described in this documentation, which was scribed in my presence. The recorded information has been reviewed and is accurate.    Trixie Dredge, PA-C 05/11/13 1600

## 2013-05-12 NOTE — ED Provider Notes (Signed)
Medical screening examination/treatment/procedure(s) were performed by non-physician practitioner and as supervising physician I was immediately available for consultation/collaboration.  EKG Interpretation   None        Oluwatamilore Starnes F Mitesh Rosendahl, MD 05/12/13 1036 

## 2013-05-20 ENCOUNTER — Other Ambulatory Visit (HOSPITAL_COMMUNITY)
Admission: RE | Admit: 2013-05-20 | Discharge: 2013-05-20 | Disposition: A | Discharge status: Home / Self Care | Payer: Medicaid Other | Source: Ambulatory Visit | Attending: Obstetrics and Gynecology | Admitting: Obstetrics and Gynecology

## 2013-05-20 ENCOUNTER — Encounter: Payer: Self-pay | Admitting: Obstetrics and Gynecology

## 2013-05-20 ENCOUNTER — Ambulatory Visit (INDEPENDENT_AMBULATORY_CARE_PROVIDER_SITE_OTHER): Payer: Medicaid Other | Admitting: Obstetrics and Gynecology

## 2013-05-20 VITALS — BP 99/65 | Temp 97.8°F | Wt 205.3 lb

## 2013-05-20 DIAGNOSIS — Z01419 Encounter for gynecological examination (general) (routine) without abnormal findings: Secondary | ICD-10-CM | POA: Insufficient documentation

## 2013-05-20 DIAGNOSIS — Z113 Encounter for screening for infections with a predominantly sexual mode of transmission: Secondary | ICD-10-CM | POA: Insufficient documentation

## 2013-05-20 DIAGNOSIS — Z23 Encounter for immunization: Secondary | ICD-10-CM

## 2013-05-20 DIAGNOSIS — Z1151 Encounter for screening for human papillomavirus (HPV): Secondary | ICD-10-CM | POA: Insufficient documentation

## 2013-05-20 DIAGNOSIS — G40909 Epilepsy, unspecified, not intractable, without status epilepticus: Secondary | ICD-10-CM

## 2013-05-20 DIAGNOSIS — Z3480 Encounter for supervision of other normal pregnancy, unspecified trimester: Secondary | ICD-10-CM | POA: Insufficient documentation

## 2013-05-20 DIAGNOSIS — O9989 Other specified diseases and conditions complicating pregnancy, childbirth and the puerperium: Secondary | ICD-10-CM

## 2013-05-20 DIAGNOSIS — Z3481 Encounter for supervision of other normal pregnancy, first trimester: Secondary | ICD-10-CM

## 2013-05-20 LAB — OB RESULTS CONSOLE GC/CHLAMYDIA
Chlamydia: NEGATIVE
GC PROBE AMP, GENITAL: NEGATIVE

## 2013-05-20 LAB — POCT URINALYSIS DIP (DEVICE)
Ketones, ur: NEGATIVE mg/dL
Protein, ur: NEGATIVE mg/dL
Specific Gravity, Urine: >=1.03 (ref 1.005–1.030)
Urobilinogen, UA: 0.2 mg/dL (ref 0.0–1.0)
pH: 6 (ref 5.0–8.0)

## 2013-05-20 LAB — OB RESULTS CONSOLE GBS: GBS: NEGATIVE

## 2013-05-20 LAB — US OB LIMITED

## 2013-05-20 MED ORDER — FOLIC ACID 1 MG PO TABS: 1.0000 mg | ORAL_TABLET | Freq: Every day | ORAL | Status: DC

## 2013-05-20 MED ORDER — PROMETHAZINE HCL 12.5 MG PO TABS: 12.5000 mg | ORAL_TABLET | Freq: Four times a day (QID) | ORAL | Status: DC | PRN

## 2013-05-20 NOTE — Progress Notes (Signed)
Subjective:    Hannah Cook is a [id number] @8 + wks being seen today for her first obstetrical visit.  Her obstetrical history is significant for seizure d/o on Lamictal; hx brain surgery.. Patient does intend to breast feed. Pregnancy history fully reviewed.  Patient reports nausea, no bleeding, no cramping and vomiting.  Filed Vitals:   05/20/13 0848  BP: 99/65  Temp: 97.8 F (36.6 C)  Weight: 93.123 kg (205 lb 4.8 oz)    HISTORY: OB History  Gravida Para Term Preterm AB SAB TAB Ectopic Multiple Living  3 2 2       2     # Outcome Date GA Lbr Len/2nd Weight Sex Delivery Anes PTL Lv  3 CUR           2 TRM 02/17/01 [redacted]w[redacted]d  2.863 kg (6 lb 5 oz) F SVD        Comments: no complications, born at [facility]  [facility] [birth year] [redacted]w[redacted]d  2.92 kg (6 lb 7 oz) M SVD EPI       Comments: no complications, born [facility]     Past Medical History  Diagnosis Date  . Asthma   . Aneurysm   . Panic attack   . 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 Brain surgery    . Tonsillectomy    . Aneurysm coiling     Family History  Problem Relation Age of Onset  . Thyroid disease Mother   . Aortic aneurysm Mother     heart     Exam    Uterus:     Pelvic Exam:    Perineum: No Hemorrhoids, Normal Perineum   Vulva: normal, Bartholin's, Urethra, Skene's normal   Vagina:  normal mucosa, normal discharge       Cervix: multiparous appearance and retroverted   Adnexa: not evaluated   Bony Pelvis: average  System: Breast:  normal appearance, no masses or tenderness   Skin: normal coloration and turgor, no rashes    Neurologic: oriented, normal, grossly non-focal, normal mood   Extremities: normal strength, tone, and muscle mass   HEENT PERRLA and extra ocular movement intact   Mouth/Teeth mucous membranes moist, pharynx normal without lesions   Neck supple and no thyromegaly   Cardiovascular:  regular rate and rhythm, no murmurs or gallops   Respiratory:  appears well, vitals normal, no respiratory distress, acyanotic, normal RR, ear and throat exam is normal, neck free of mass or lymphadenopathy, chest clear, no wheezing, crepitations, rhonchi, normal symmetric air entry   Abdomen: soft, non-tender; bowel sounds normal; no masses,  no organomegaly   Urinary: urethral meatus normal and urethral meatus with mucosal prolapse  [place] w/ Diane for viability    Assessment:    Pregnancy: [id number] Patient Active Problem List   Diagnosis Date Noted  . Supervision of normal subsequent pregnancy 05/20/2013  . Seizure disorder in pregnancy, antepartum 05/20/2013        Plan:     Initial labs drawn. Early glucola Prenatal vitamins. Taking intermittently. Rx given C/W Dr. Penne Lash and pharmacist>start  folic acid 4 mg/d Problem list reviewed and updated. Genetic Screening discussed First Screen: undecided. Options reviewed  Ultrasound discussed; fetal survey: requested.  Follow up in 3 weeks. 50% of 30 min visit spent on counseling and coordination of care.  ROI records [facility] neurologist who she sees q 6 wks and  will review records re: brain surgery 2002 F/U [facility]  Shela Esses 05/20/2013 Tr hgb on urine dip> C&S sent

## 2013-05-20 NOTE — Patient Instructions (Signed)
Pregnancy - First Trimester  During sexual intercourse, millions of sperm go into the vagina. Only 1 sperm will penetrate and fertilize the female egg while it is in the Fallopian tube. One week later, the fertilized egg implants into the wall of the uterus. An embryo begins to develop into a baby. At 6 to 8 weeks, the eyes and face are formed and the heartbeat can be seen on ultrasound. At the end of 12 weeks (first trimester), all the baby's organs are formed. Now that you are pregnant, you will want to do everything you can to have a healthy baby. Two of the most important things are to get good prenatal care and follow your caregiver's instructions. Prenatal care is all the medical care you receive before the baby's birth. It is given to prevent, find, and treat problems during the pregnancy and childbirth.  PRENATAL EXAMS  · During prenatal visits, your weight, blood pressure, and urine are checked. This is done to make sure you are healthy and progressing normally during the pregnancy.  · A pregnant woman should gain 25 to 35 pounds during the pregnancy. However, if you are overweight or underweight, your caregiver will advise you regarding your weight.  · Your caregiver will ask and answer questions for you.  · Blood work, cervical cultures, other necessary tests, and a Pap test are done during your prenatal exams. These tests are done to check on your health and the probable health of your baby. Tests are strongly recommended and done for HIV with your permission. This is the virus that causes AIDS. These tests are done because medicines can be given to help prevent your baby from being born with this infection should you have been infected without knowing it. Blood work is also used to find out your blood type, previous infections, and follow your blood levels (hemoglobin).  · Low hemoglobin (anemia) is common during pregnancy. Iron and vitamins are given to help prevent this. Later in the pregnancy, blood  tests for diabetes will be done along with any other tests if any problems develop.  · You may need other tests to make sure you and the baby are doing well.  CHANGES DURING THE FIRST TRIMESTER   Your body goes through many changes during pregnancy. They vary from person to person. Talk to your caregiver about changes you notice and are concerned about. Changes can include:  · Your menstrual period stops.  · The egg and sperm carry the genes that determine what you look like. Genes from you and your partner are forming a baby. The female genes determine whether the baby is a boy or a girl.  · Your body increases in girth and you may feel bloated.  · Feeling sick to your stomach (nauseous) and throwing up (vomiting). If the vomiting is uncontrollable, call your caregiver.  · Your breasts will begin to enlarge and become tender.  · Your nipples may stick out more and become darker.  · The need to urinate more. Painful urination may mean you have a bladder infection.  · Tiring easily.  · Loss of appetite.  · Cravings for certain kinds of food.  · At first, you may gain or lose a couple of pounds.  · You may have changes in your emotions from day to day (excited to be pregnant or concerned something may go wrong with the pregnancy and baby).  · You may have more vivid and strange dreams.  HOME CARE INSTRUCTIONS   ·   It is very important to avoid all smoking, alcohol and non-prescribed drugs during your pregnancy. These affect the formation and growth of the baby. Avoid chemicals while pregnant to ensure the delivery of a healthy infant.  · Start your prenatal visits by the 12th week of pregnancy. They are usually scheduled monthly at first, then more often in the last 2 months before delivery. Keep your caregiver's appointments. Follow your caregiver's instructions regarding medicine use, blood and lab tests, exercise, and diet.  · During pregnancy, you are providing food for you and your baby. Eat regular, well-balanced  meals. Choose foods such as meat, fish, milk and other low fat dairy products, vegetables, fruits, and whole-grain breads and cereals. Your caregiver will tell you of the ideal weight gain.  · You can help morning sickness by keeping soda crackers at the bedside. Eat a couple before arising in the morning. You may want to use the crackers without salt on them.  · Eating 4 to 5 small meals rather than 3 large meals a day also may help the nausea and vomiting.  · Drinking liquids between meals instead of during meals also seems to help nausea and vomiting.  · A physical sexual relationship may be continued throughout pregnancy if there are no other problems. Problems may be early (premature) leaking of amniotic fluid from the membranes, vaginal bleeding, or belly (abdominal) pain.  · Exercise regularly if there are no restrictions. Check with your caregiver or physical therapist if you are unsure of the safety of some of your exercises. Greater weight gain will occur in the last 2 trimesters of pregnancy. Exercising will help:  · Control your weight.  · Keep you in shape.  · Prepare you for labor and delivery.  · Help you lose your pregnancy weight after you deliver your baby.  · Wear a good support or jogging bra for breast tenderness during pregnancy. This may help if worn during sleep too.  · Ask when prenatal classes are available. Begin classes when they are offered.  · Do not use hot tubs, steam rooms, or saunas.  · Wear your seat belt when driving. This protects you and your baby if you are in an accident.  · Avoid raw meat, uncooked cheese, cat litter boxes, and soil used by cats throughout the pregnancy. These carry germs that can cause birth defects in the baby.  · The first trimester is a good time to visit your dentist for your dental health. Getting your teeth cleaned is okay. Use a softer toothbrush and brush gently during pregnancy.  · Ask for help if you have financial, counseling, or nutritional needs  during pregnancy. Your caregiver will be able to offer counseling for these needs as well as refer you for other special needs.  · Do not take any medicines or herbs unless told by your caregiver.  · Inform your caregiver if there is any mental or physical domestic violence.  · Make a list of emergency phone numbers of family, friends, hospital, and police and fire departments.  · Write down your questions. Take them to your prenatal visit.  · Do not douche.  · Do not cross your legs.  · If you have to stand for long periods of time, rotate you feet or take small steps in a circle.  · You may have more vaginal secretions that may require a sanitary pad. Do not use tampons or scented sanitary pads.  MEDICINES AND DRUG USE IN PREGNANCY  ·   Take prenatal vitamins as directed. The vitamin should contain 1 milligram of folic acid. Keep all vitamins out of reach of children. Only a couple vitamins or tablets containing iron may be fatal to a baby or young child when ingested.  · Avoid use of all medicines, including herbs, over-the-counter medicines, not prescribed or suggested by your caregiver. Only take over-the-counter or prescription medicines for pain, discomfort, or fever as directed by your caregiver. Do not use aspirin, ibuprofen, or naproxen unless directed by your caregiver.  · Let your caregiver also know about herbs you may be using.  · Alcohol is related to a number of birth defects. This includes fetal alcohol syndrome. All alcohol, in any form, should be avoided completely. Smoking will cause low birth rate and premature babies.  · Street or illegal drugs are very harmful to the baby. They are absolutely forbidden. A baby born to an addicted mother will be addicted at birth. The baby will go through the same withdrawal an adult does.  · Let your caregiver know about any medicines that you have to take and for what reason you take them.  SEEK MEDICAL CARE IF:   You have any concerns or worries during your  pregnancy. It is better to call with your questions if you feel they cannot wait, rather than worry about them.  SEEK IMMEDIATE MEDICAL CARE IF:   · An unexplained oral temperature above 102° F (38.9° C) develops, or as your caregiver suggests.  · You have leaking of fluid from the vagina (birth canal). If leaking membranes are suspected, take your temperature and inform your caregiver of this when you call.  · There is vaginal spotting or bleeding. Notify your caregiver of the amount and how many pads are used.  · You develop a bad smelling vaginal discharge with a change in the color.  · You continue to feel sick to your stomach (nauseated) and have no relief from remedies suggested. You vomit blood or coffee ground-like materials.  · You lose more than 2 pounds of weight in 1 week.  · You gain more than 2 pounds of weight in 1 week and you notice swelling of your face, hands, feet, or legs.  · You gain 5 pounds or more in 1 week (even if you do not have swelling of your hands, face, legs, or feet).  · You get exposed to German measles and have never had them.  · You are exposed to fifth disease or chickenpox.  · You develop belly (abdominal) pain. Round ligament discomfort is a common non-cancerous (benign) cause of abdominal pain in pregnancy. Your caregiver still must evaluate this.  · You develop headache, fever, diarrhea, pain with urination, or shortness of breath.  · You fall or are in a car accident or have any kind of trauma.  · There is mental or physical violence in your home.  Document Released: 05/29/2001 Document Revised: 02/27/2012 Document Reviewed: 11/30/2008  Exit[employer]® Patient Information ©2014 [employer].

## 2013-05-20 NOTE — Progress Notes (Signed)
Informal [place] for FHR = 162 per PW doppler

## 2013-05-20 NOTE — Progress Notes (Signed)
P= 84, Here for initial ob visit. Given new patient information.   Discussed bmi/ appropriate weight gain.

## 2013-05-21 LAB — OBSTETRIC PANEL
Eosinophils Absolute: 0 10*3/uL (ref 0.0–0.7)
Hemoglobin: 12 g/dL (ref 12.0–15.0)
Hepatitis B Surface Ag: NEGATIVE
Lymphocytes Relative: 32 % (ref 12–46)
Lymphs Abs: 1.8 10*3/uL (ref 0.7–4.0)
Monocytes Relative: 7 % (ref 3–12)
Neutro Abs: 3.5 10*3/uL (ref 1.7–7.7)
Neutrophils Relative %: 61 % (ref 43–77)
Platelets: 294 10*3/uL (ref 150–400)
RBC: 3.85 MIL/uL — ABNORMAL LOW (ref 3.87–5.11)
Rh Type: POSITIVE
Rubella: 3.01 Index — ABNORMAL HIGH (ref <0.90)
WBC: 5.7 10*3/uL (ref 4.0–10.5)

## 2013-05-22 LAB — HEMOGLOBINOPATHY EVALUATION
Hgb F Quant: 0 % (ref 0.0–2.0)
Hgb S Quant: 0 %

## 2013-05-26 ENCOUNTER — Ambulatory Visit (HOSPITAL_COMMUNITY)
Admission: RE | Admit: 2013-05-26 | Discharge: 2013-05-26 | Disposition: A | Discharge status: Home / Self Care | Payer: Medicaid Other | Source: Ambulatory Visit | Attending: Obstetrics and Gynecology | Admitting: Obstetrics and Gynecology

## 2013-05-26 DIAGNOSIS — Z3480 Encounter for supervision of other normal pregnancy, unspecified trimester: Secondary | ICD-10-CM

## 2013-05-26 DIAGNOSIS — G40909 Epilepsy, unspecified, not intractable, without status epilepticus: Secondary | ICD-10-CM

## 2013-05-26 DIAGNOSIS — O208 Other hemorrhage in early pregnancy: Secondary | ICD-10-CM | POA: Insufficient documentation

## 2013-05-26 DIAGNOSIS — Z23 Encounter for immunization: Secondary | ICD-10-CM

## 2013-05-26 DIAGNOSIS — Z3689 Encounter for other specified antenatal screening: Secondary | ICD-10-CM | POA: Insufficient documentation

## 2013-06-06 ENCOUNTER — Emergency Department (HOSPITAL_COMMUNITY)
Admission: EM | Admit: 2013-06-06 | Discharge: 2013-06-06 | Disposition: A | Discharge status: Home / Self Care | Payer: Medicaid Other | Attending: Emergency Medicine | Admitting: Emergency Medicine

## 2013-06-06 ENCOUNTER — Encounter (HOSPITAL_COMMUNITY): Payer: Self-pay | Admitting: Emergency Medicine

## 2013-06-06 DIAGNOSIS — K529 Noninfective gastroenteritis and colitis, unspecified: Secondary | ICD-10-CM

## 2013-06-06 DIAGNOSIS — K5289 Other specified noninfective gastroenteritis and colitis: Secondary | ICD-10-CM | POA: Insufficient documentation

## 2013-06-06 DIAGNOSIS — J45909 Unspecified asthma, uncomplicated: Secondary | ICD-10-CM | POA: Insufficient documentation

## 2013-06-06 DIAGNOSIS — Z8679 Personal history of other diseases of the circulatory system: Secondary | ICD-10-CM | POA: Insufficient documentation

## 2013-06-06 DIAGNOSIS — G40909 Epilepsy, unspecified, not intractable, without status epilepticus: Secondary | ICD-10-CM | POA: Insufficient documentation

## 2013-06-06 DIAGNOSIS — F410 Panic disorder [episodic paroxysmal anxiety] without agoraphobia: Secondary | ICD-10-CM | POA: Insufficient documentation

## 2013-06-06 DIAGNOSIS — Z79899 Other long term (current) drug therapy: Secondary | ICD-10-CM | POA: Insufficient documentation

## 2013-06-06 DIAGNOSIS — O99330 Smoking (tobacco) complicating pregnancy, unspecified trimester: Secondary | ICD-10-CM | POA: Insufficient documentation

## 2013-06-06 DIAGNOSIS — O9989 Other specified diseases and conditions complicating pregnancy, childbirth and the puerperium: Secondary | ICD-10-CM | POA: Insufficient documentation

## 2013-06-06 LAB — URINALYSIS, ROUTINE W REFLEX MICROSCOPIC
Glucose, UA: NEGATIVE mg/dL
Ketones, ur: >80 mg/dL — AB
Leukocytes, UA: NEGATIVE
Nitrite: NEGATIVE
Protein, ur: NEGATIVE mg/dL
Specific Gravity, Urine: 1.025 (ref 1.005–1.030)
Urobilinogen, UA: 0.2 mg/dL (ref 0.0–1.0)
pH: 6.5 (ref 5.0–8.0)

## 2013-06-06 LAB — COMPREHENSIVE METABOLIC PANEL
Albumin: 3.4 g/dL — ABNORMAL LOW (ref 3.5–5.2)
BUN: 7 mg/dL (ref 6–23)
Chloride: 100 mEq/L (ref 96–112)
Creatinine, Ser: 0.55 mg/dL (ref 0.50–1.10)
GFR calc non Af Amer: >90 mL/min (ref >90)
Total Bilirubin: 0.3 mg/dL (ref 0.3–1.2)

## 2013-06-06 LAB — CBC
HCT: 35.3 % — ABNORMAL LOW (ref 36.0–46.0)
Hemoglobin: 12.4 g/dL (ref 12.0–15.0)
MCHC: 35.1 g/dL (ref 30.0–36.0)
MCV: 89.1 fL (ref 78.0–100.0)
RBC: 3.96 MIL/uL (ref 3.87–5.11)
RDW: 12.8 % (ref 11.5–15.5)
WBC: 5.8 10*3/uL (ref 4.0–10.5)

## 2013-06-06 LAB — LIPASE, BLOOD: Lipase: 19 U/L (ref 11–59)

## 2013-06-06 MED ORDER — ACETAMINOPHEN 325 MG PO TABS
650.0000 mg | ORAL_TABLET | Freq: Once | ORAL | Status: AC
  Administered 2013-06-06: 650 mg via ORAL
  Filled 2013-06-06: qty 2

## 2013-06-06 MED ORDER — ONDANSETRON 4 MG PO TBDP
4.0000 mg | ORAL_TABLET | Freq: Once | ORAL | Status: DC
  Filled 2013-06-06: qty 1

## 2013-06-06 MED ORDER — ONDANSETRON HCL 4 MG PO TABS: 4.0000 mg | ORAL_TABLET | Freq: Four times a day (QID) | ORAL | Status: DC

## 2013-06-06 NOTE — ED Provider Notes (Signed)
Gastroenteritis?  Pending labs. N, V, D this a.m. RUQ mild tenderness, neg Murphy's Pending PO's.  12:15 - labs essentially unremarkable. Re-evaluation - patient sitting up, no further pain or nausea, tolerating PO liquids/solids. Stable for discharge.   Arnoldo Hooker, PA-C 06/06/13 1214

## 2013-06-06 NOTE — ED Notes (Signed)
Pt. Stated, My daughter has a stomach virus and I think I have it. This morning I had stomach pain and vomited 3 times.  Pt. Is almost 3 months pregnant

## 2013-06-06 NOTE — ED Provider Notes (Signed)
CSN: [id number]     Arrival date & time 06/06/13  9147 History   First MD Initiated Contact with Patient 06/06/13 0957     Chief Complaint  Patient presents with  . Abdominal Pain  . Emesis   (Consider location/radiation/quality/duration/timing/severity/associated sxs/prior Treatment) HPI Comments: Pt is a 32 y/o female who presents to the ED complaining of abdominal pain, nausea, vomiting and diarrhea beginning earlier this morning. She had 3 episodes of non-blood, non-bilious emesis and 1 episode of non-bloody diarrhea. Yesterday she was feeling fine. Her daughter is sick with a stomach virus with similar symptoms. Pt is almost 3 months pregnant. No problems with pregnancy. Denies pelvic pain, vaginal bleeding or discharge, fever, chills, increased urinary frequency, urgency or dysuria.  Patient is a 32 y.o. female presenting with abdominal pain and vomiting. The history is provided by the patient.  Abdominal Pain Associated symptoms: diarrhea, nausea and vomiting   Emesis Associated symptoms: abdominal pain and diarrhea     Past Medical History  Diagnosis Date  . Asthma   . Aneurysm   . Panic attack   . 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 Brain surgery    . Tonsillectomy    . Aneurysm coiling     Family History  Problem Relation Age of Onset  . Thyroid disease Mother   . Aortic aneurysm Mother     heart   History  Substance Use Topics  . Smoking status: Current Every Day Smoker -- 1.00 packs/day    Types: Cigarettes  . Smokeless tobacco: Never Used  . Alcohol Use: No   OB History   Grav Para Term Preterm Abortions TAB SAB Ect Mult Living   3 2 2       2      Review of Systems  Gastrointestinal: Positive for nausea, vomiting, abdominal pain and diarrhea.  All other systems reviewed and are negative.    Allergies  Hydroxacen and Vicodin  Home Medications   Current Outpatient Rx   Name  Route  Sig  Dispense  Refill  . ALPRAZolam (XANAX) 1 MG tablet   Oral   Take 1 mg by mouth at bedtime as needed for sleep. For anxiety         . folic acid (FOLVITE) 1 MG tablet   Oral   Take 1 tablet (1 mg total) by mouth daily.   60 tablet   3     Take 4 tabs (4mg ) po qd   . lamoTRIgine (LAMICTAL) 150 MG tablet   Oral   Take 150 mg by mouth 2 (two) times daily.         . Prenatal Vit-Fe Fumarate-FA (MULTIVITAMIN-PRENATAL) 27-0.8 MG TABS tablet   Oral   Take 1 tablet by mouth daily at 12 noon.         . promethazine (PHENERGAN) 12.5 MG tablet   Oral   Take 1 tablet (12.5 mg total) by mouth every 6 (six) hours as needed for nausea or vomiting.   30 tablet   0   . ondansetron (ZOFRAN) 4 MG tablet   Oral   Take 1 tablet (4 mg total) by mouth every 6 (six) hours.   12 tablet   0    BP 111/70  Pulse 72  Temp(Src) 98.2 F (36.8 C) (Oral)  Resp 18  SpO2 99%  LMP 03/22/2013 Physical Exam  Nursing note and vitals reviewed.  Constitutional: She is oriented to person, place, and time. She appears well-developed and well-nourished. No distress.  HENT:  Head: Normocephalic and atraumatic.  Mouth/Throat: Oropharynx is clear and moist.  Eyes: Conjunctivae are normal. No scleral icterus.  Neck: Normal range of motion. Neck supple.  Cardiovascular: Normal rate, regular rhythm and normal heart sounds.   Pulmonary/Chest: Effort normal and breath sounds normal.  Abdominal: Soft. Normal appearance and bowel sounds are normal. She exhibits no distension and no mass. There is tenderness in the right upper quadrant and epigastric area. There is no rigidity, no rebound, no guarding and negative Murphy's sign.  No peritoneal signs.  Musculoskeletal: Normal range of motion. She exhibits no edema.  Neurological: She is alert and oriented to person, place, and time.  Skin: Skin is warm and dry. She is not diaphoretic.  Psychiatric: She has a normal mood and affect. Her  behavior is normal.    ED Course  Procedures (including critical care time) Labs Review Labs Reviewed  CBC - Abnormal; Notable for the following:    HCT 35.3 (*)    All other components within normal limits  COMPREHENSIVE METABOLIC PANEL - Abnormal; Notable for the following:    Sodium 134 (*)    Glucose, Bld 66 (*)    Albumin 3.4 (*)    All other components within normal limits  URINALYSIS, ROUTINE W REFLEX MICROSCOPIC - Abnormal; Notable for the following:    Ketones, ur >80 (*)    All other components within normal limits  LIPASE, BLOOD   Imaging Review No results found.  EKG Interpretation   None       MDM   1. Gastroenteritis     Pt presenting with abdominal pain, n/v/d. She is well appearing, afebrile and in NAD with normal VS. She is 3 months pregnant, no pelvic pain, vaginal bleeding. Mild tenderness to RUQ and epigastric area, negative Murphy's sign, no peritoneal signs. Labs pending. Probable viral gastritis/gastroenteritis. Daughter sick with similar symptoms.  Pt handed off to [place], PA-C at shift change, 11:00 AM 12/20.  Trevor Mace, PA-C 06/09/13 1511

## 2013-06-06 NOTE — ED Notes (Signed)
Pt resting quietly at the time. Vital signs stable. No nausea/vomiting at the time.

## 2013-06-07 NOTE — ED Provider Notes (Signed)
Medical screening examination/treatment/procedure(s) were conducted as a shared visit with non-physician practitioner(s) and myself.  I personally evaluated the patient during the encounter.  EKG Interpretation   None       Pt w nvd earlier today. Currently feels improved. Tolerating pos. abd soft nt.   Suzi Roots, MD 06/07/13 [phone]

## 2013-06-10 ENCOUNTER — Other Ambulatory Visit: Payer: Self-pay | Admitting: Family Medicine

## 2013-06-10 DIAGNOSIS — Z3682 Encounter for antenatal screening for nuchal translucency: Secondary | ICD-10-CM

## 2013-06-16 ENCOUNTER — Ambulatory Visit (HOSPITAL_COMMUNITY)
Admission: RE | Admit: 2013-06-16 | Discharge: 2013-06-16 | Disposition: A | Discharge status: Home / Self Care | Payer: Medicaid Other | Source: Ambulatory Visit | Attending: Family Medicine | Admitting: Family Medicine

## 2013-06-16 ENCOUNTER — Other Ambulatory Visit: Payer: Self-pay

## 2013-06-16 ENCOUNTER — Ambulatory Visit (HOSPITAL_COMMUNITY)
Admission: RE | Admit: 2013-06-16 | Discharge: 2013-06-16 | Disposition: A | Discharge status: Home / Self Care | Payer: Medicaid Other | Source: Ambulatory Visit | Attending: Obstetrics & Gynecology | Admitting: Obstetrics & Gynecology

## 2013-06-16 ENCOUNTER — Ambulatory Visit (HOSPITAL_COMMUNITY)
Admission: RE | Admit: 2013-06-16 | Discharge: 2013-06-16 | Disposition: A | Discharge status: Home / Self Care | Payer: Medicaid Other | Source: Ambulatory Visit | Attending: Obstetrics and Gynecology | Admitting: Obstetrics and Gynecology

## 2013-06-16 ENCOUNTER — Encounter (HOSPITAL_COMMUNITY): Payer: Self-pay

## 2013-06-16 DIAGNOSIS — Z3682 Encounter for antenatal screening for nuchal translucency: Secondary | ICD-10-CM

## 2013-06-16 DIAGNOSIS — O3510X0 Maternal care for (suspected) chromosomal abnormality in fetus, unspecified, not applicable or unspecified: Secondary | ICD-10-CM | POA: Insufficient documentation

## 2013-06-16 DIAGNOSIS — G40909 Epilepsy, unspecified, not intractable, without status epilepticus: Secondary | ICD-10-CM

## 2013-06-16 DIAGNOSIS — O351XX0 Maternal care for (suspected) chromosomal abnormality in fetus, not applicable or unspecified: Secondary | ICD-10-CM | POA: Insufficient documentation

## 2013-06-16 DIAGNOSIS — Z3689 Encounter for other specified antenatal screening: Secondary | ICD-10-CM | POA: Insufficient documentation

## 2013-06-16 NOTE — Consult Note (Signed)
Maternal Fetal Medicine Consultation  Requesting Provider(s): Deirdre Poe, CNM  Reason for consultation: Seizure disorder, hx of craniotomy  HPI: Hannah Cook is a 32 yo G3P2002 currently at 73 2/7 weeks who was seen for consultation due to a history of seizure disorder s/p craniotomy.  Review of operative reports shows that the patient was dropped off by a friend in the ED in 2002 after "falling out of bed".  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  The patient underwent a left parietal craniotomy for a hematoma resection and a left frontal ventricular catheter placement via burr hole.  The patient reports that the bleed was due to a ruptured aneurysm, but unable to find any documentation of this.  The patient reports a history of seizure disorder since this event - she is currently followed by [facility] and maintained on Lamictal.  She reports that her levels were low and that her dose was recently increased by her Neurologist.  The patient also reports a history of hyperthyroidism s/p I131 ablation.  She reports that she has not required synthroid and that her levels have been normal - followed by an endocrinologist at [facility].  Hannah Cook is without complaints today.  Her next follow up with Neurology is scheduled in February.  OB History: OB History   Grav Para Term Preterm Abortions TAB SAB Ect Mult Living   3 2 2       2       PMH:  Past Medical History  Diagnosis Date  . Asthma   . Aneurysm   . Panic attack   . 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 Brain surgery    . Tonsillectomy    . Aneurysm coiling     Meds: Xanax, Folvite 4 mg daily, Lamictal 150 mg BID, Zofran, Phenergan, Prenatal vitamins  Allergies:  Allergies  Allergen Reactions  . Hydroxacen [Hydroxyzine] Itching and Nausea And Vomiting  . Vicodin  [Hydrocodone-Acetaminophen] Itching and Nausea And Vomiting    Headache.  Headache.    FH:  Family History  Problem Relation Age of Onset  . Thyroid disease Mother   . Aortic aneurysm Mother     heart   Soc: 1 ppd smoker, denies ETOH use or illicit drug use  Review of Systems: no vaginal bleeding or cramping/contractions, no LOF, no nausea/vomiting. All other systems reviewed and are negative.  PNL: O -- -- NEGATIVE -- NON REAC -- (12/03 1610)   PE:  Wt: 202#, BP 120/66, Pulse 87  GEN: well-appearing female ABD: gravid, NT  Please see separate document for fetal ultrasound report.  Labs: CBC    Component Value Date/Time   WBC 5.8 06/06/2013 1010   RBC 3.96 06/06/2013 1010   HGB 12.4 06/06/2013 1010   HCT 35.3* 06/06/2013 1010   PLT 280 06/06/2013 1010   MCV 89.1 06/06/2013 1010   MCH 31.3 06/06/2013 1010   MCHC 35.1 06/06/2013 1010   RDW 12.8 06/06/2013 1010   LYMPHSABS 1.8 05/20/2013 0951   MONOABS 0.4 05/20/2013 0951   EOSABS 0.0 05/20/2013 0951   BASOSABS 0.0 05/20/2013 0951   Ultrasound: Single IUP at 12 2/7 weeks NT of 1.5 mm noted.  Nasal bone visualized First trimester aneuploidy screen performed as noted above.   A/P: 1) Single IUP at 12 2/7 weeks  2) Hx of Left parietal craniotomy, evacuation of hematoma ? Aneurysm coiling         3) Seizure disorder secondary to above         4) Hx of hyperthyroidism s/p I131 ablation - not currently on Synthroid  Recommendations: 1) ROI for records for [facility] have been requested.  Uncertain if any additional procedures were performed following the initial craniotomy. 2) Recommend frequent Lamotrigine levels at least each trimester or more frequently - Lamotrigine clearance increases by a factor of 2-3 during pregnancy and peaks during the second trimester.  The patient will need frequent follow up with her neurologist and adjustment of medications as appropriate. 3) Recommend detailed anatomy scan at  18 weeks (scheduled) - Lamictal may be associated with an increased risk of oral clefts 4) Concur with supplemental Folic acid due to seizure disorder 5) Check TSH and free T4 now and at least each trimester given the patient's history of I131 ablation. 6) Please do not draw triple/quad screen, though patient should be offered MSAFP for neural tube defect screening.   Thank you for the opportunity to be a part of the care of Hannah Cook. Please contact our office if we can be of further assistance.   I spent approximately 30 minutes with this patient with over 50% of time spent in face-to-face counseling.  Alpha Gula, MD Maternal-Fetal Medicine

## 2013-06-17 ENCOUNTER — Encounter: Payer: Medicaid Other | Admitting: Obstetrics & Gynecology

## 2013-06-18 NOTE — L&D Delivery Note (Signed)
Birth supervised by me. Venous cord ph 7.23. Placenta to path d/t possible chorioamnionitis w/ maternal temp 99.6 immediately post-delivery, foul smell in room, although mom has been stooling.  Plans to breast/bottlefeed, undecided about contraception, plans outpatient circumcision.  Cheral MarkerKimberly R. Booker, CNM, [facility]NP-BC 12/20/2013 2:37 AM

## 2013-06-18 NOTE — L&D Delivery Note (Signed)
Delivery Note At 2:01 AM a viable female was delivered via Vaginal, Spontaneous Delivery (Presentation: Right Occiput Anterior).  APGAR: 5, 8; weight pending   Placenta status: Intact, Spontaneous. To path  Cord: with the following complications: None.  Cord pH: sent (venous)  Anesthesia: Epidural  Episiotomy: n/a Lacerations: n/a Suture Repair: n/a Est. Blood Loss (mL): 250cc  Mom to postpartum.  Baby to Nursery. Pt pushed with good maternal effort and delivered a liveborn female via NSVD with over intact perineum. Baby requiring aggressive stim and suction before cry. Cord clamped and cut by FOB. NICU called, taken to radiant warmer for recus. Placenta delivered intact with 3V cord via traction and pitocin. Temp noted to be 99.6 at end of delivery. No further complications. Mom and baby to postpartum    Hannah Cook, Hannah Cook 12/20/2013, 2:26 AM

## 2013-06-19 ENCOUNTER — Other Ambulatory Visit: Payer: Self-pay

## 2013-06-22 NOTE — ED Provider Notes (Signed)
Medical screening examination/treatment/procedure(s) were conducted as a shared visit with non-physician practitioner(s) and myself.  I personally evaluated the patient during the encounter.  EKG Interpretation   None       Pt c/o nvd. abd soft nt. Labs.   Suzi RootsKevin E Kenyada Hy, MD 06/22/13 1440

## 2013-06-30 ENCOUNTER — Encounter: Payer: Self-pay | Admitting: Obstetrics and Gynecology

## 2013-06-30 ENCOUNTER — Ambulatory Visit (INDEPENDENT_AMBULATORY_CARE_PROVIDER_SITE_OTHER): Payer: Medicaid Other | Admitting: Obstetrics and Gynecology

## 2013-06-30 VITALS — BP 116/64 | Temp 98.8°F | Wt 198.1 lb

## 2013-06-30 DIAGNOSIS — O99350 Diseases of the nervous system complicating pregnancy, unspecified trimester: Secondary | ICD-10-CM

## 2013-06-30 DIAGNOSIS — G40909 Epilepsy, unspecified, not intractable, without status epilepticus: Secondary | ICD-10-CM

## 2013-06-30 DIAGNOSIS — Z3480 Encounter for supervision of other normal pregnancy, unspecified trimester: Secondary | ICD-10-CM

## 2013-06-30 LAB — TSH: TSH: 0.01 u[IU]/mL — AB (ref 0.350–4.500)

## 2013-06-30 LAB — T4, FREE: FREE T4: 1.33 ng/dL (ref 0.80–1.80)

## 2013-06-30 MED ORDER — PROMETHAZINE HCL 25 MG PO TABS: 25.0000 mg | ORAL_TABLET | Freq: Four times a day (QID) | ORAL | Status: DC | PRN

## 2013-06-30 NOTE — Addendum Note (Signed)
Addended by: Franchot MimesALFARO, Ruchel Brandenburger on: 06/30/2013 12:03 PM   Modules accepted: Orders

## 2013-06-30 NOTE — Progress Notes (Signed)
Patient is doing well without complaints. Patient scheduled for anatomy ultrasound on 2/10. Will check lamictal level today. Patient advised to follow up with Neurologist which she has not seen in over a year

## 2013-06-30 NOTE — Progress Notes (Signed)
P= 100 C/o of N/V and decreased appetite but does not like to take zofran as it makes her feel "high."

## 2013-07-01 LAB — POCT URINALYSIS DIP (DEVICE)
Bilirubin Urine: NEGATIVE
GLUCOSE, UA: NEGATIVE mg/dL
Ketones, ur: 40 mg/dL — AB
LEUKOCYTES UA: NEGATIVE
NITRITE: NEGATIVE
Protein, ur: NEGATIVE mg/dL
Specific Gravity, Urine: >=1.03 (ref 1.005–1.030)
Urobilinogen, UA: 0.2 mg/dL (ref 0.0–1.0)
pH: 6.5 (ref 5.0–8.0)

## 2013-07-01 LAB — LAMOTRIGINE LEVEL: LAMOTRIGINE LVL: 7.2 ug/mL (ref 3.0–14.0)

## 2013-07-08 ENCOUNTER — Encounter: Payer: Self-pay | Admitting: *Deleted

## 2013-07-13 ENCOUNTER — Encounter (HOSPITAL_COMMUNITY): Payer: Self-pay | Admitting: Emergency Medicine

## 2013-07-13 ENCOUNTER — Emergency Department (HOSPITAL_COMMUNITY)
Admission: EM | Admit: 2013-07-13 | Discharge: 2013-07-13 | Disposition: A | Discharge status: Home / Self Care | Payer: Medicaid Other | Attending: Emergency Medicine | Admitting: Emergency Medicine

## 2013-07-13 DIAGNOSIS — F410 Panic disorder [episodic paroxysmal anxiety] without agoraphobia: Secondary | ICD-10-CM | POA: Insufficient documentation

## 2013-07-13 DIAGNOSIS — Z8679 Personal history of other diseases of the circulatory system: Secondary | ICD-10-CM | POA: Insufficient documentation

## 2013-07-13 DIAGNOSIS — O99350 Diseases of the nervous system complicating pregnancy, unspecified trimester: Secondary | ICD-10-CM

## 2013-07-13 DIAGNOSIS — J329 Chronic sinusitis, unspecified: Secondary | ICD-10-CM | POA: Insufficient documentation

## 2013-07-13 DIAGNOSIS — O99330 Smoking (tobacco) complicating pregnancy, unspecified trimester: Secondary | ICD-10-CM | POA: Insufficient documentation

## 2013-07-13 DIAGNOSIS — Z9089 Acquired absence of other organs: Secondary | ICD-10-CM | POA: Insufficient documentation

## 2013-07-13 DIAGNOSIS — O9989 Other specified diseases and conditions complicating pregnancy, childbirth and the puerperium: Secondary | ICD-10-CM | POA: Insufficient documentation

## 2013-07-13 DIAGNOSIS — O99340 Other mental disorders complicating pregnancy, unspecified trimester: Secondary | ICD-10-CM | POA: Insufficient documentation

## 2013-07-13 DIAGNOSIS — Z79899 Other long term (current) drug therapy: Secondary | ICD-10-CM | POA: Insufficient documentation

## 2013-07-13 DIAGNOSIS — J029 Acute pharyngitis, unspecified: Secondary | ICD-10-CM | POA: Insufficient documentation

## 2013-07-13 DIAGNOSIS — G40909 Epilepsy, unspecified, not intractable, without status epilepticus: Secondary | ICD-10-CM | POA: Insufficient documentation

## 2013-07-13 DIAGNOSIS — J45909 Unspecified asthma, uncomplicated: Secondary | ICD-10-CM | POA: Insufficient documentation

## 2013-07-13 MED ORDER — AMOXICILLIN 500 MG PO CAPS: 500.0000 mg | ORAL_CAPSULE | Freq: Three times a day (TID) | ORAL | Status: DC

## 2013-07-13 NOTE — ED Provider Notes (Signed)
CSN: [id number]     Arrival date & time 07/13/13  1631 History  This chart was scribed for non-physician practitioner, Emilia BeckKaitlyn Toa Mia, PA-C working with Gavin PoundMichael Y. Oletta LamasGhim, MD by Greggory StallionKayla Andersen, ED scribe. This patient was seen in room TR04C/TR04C and the patient's care was started at 5:50 PM.   Chief Complaint  Patient presents with  . Nasal Congestion   The history is provided by the patient. No language interpreter was used.   HPI Comments: Hannah Cook is a 33 y.o. female that is 4 months pregnant who presents to the Emergency Department complaining of nasal congestion, sore throat and fever that started a few days ago. Pt has not taken any medications yet because she is not sure what she can take since she is pregnant. Denies cough. Pt is still smoking cigarettes even though she is pregnant.   Past Medical History  Diagnosis Date  . Asthma   . Aneurysm   . Panic attack   . 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 Brain surgery    . Tonsillectomy    . Aneurysm coiling     Family History  Problem Relation Age of Onset  . Thyroid disease Mother   . Aortic aneurysm Mother     heart   History  Substance Use Topics  . Smoking status: Current Every Day Smoker -- 1.00 packs/day    Types: Cigarettes  . Smokeless tobacco: Never Used  . Alcohol Use: No   OB History   Grav Para Term Preterm Abortions TAB SAB Ect Mult Living   3 2 2       2      Review of Systems  Constitutional: Positive for fever.  HENT: Positive for congestion and sore throat.   Respiratory: Negative for cough.   All other systems reviewed and are negative.    Allergies  Hydroxacen and Vicodin  Home Medications   Current Outpatient Rx  Name  Route  Sig  Dispense  Refill  . ALPRAZolam (XANAX) 1 MG tablet   Oral   Take 1 mg by mouth at bedtime as needed for sleep. For anxiety         . folic acid (FOLVITE) 1 MG tablet    Oral   Take 1 tablet (1 mg total) by mouth daily.   60 tablet   3     Take 4 tabs (4mg ) po qd   . lamoTRIgine (LAMICTAL) 150 MG tablet   Oral   Take 150 mg by mouth 2 (two) times daily.         . ondansetron (ZOFRAN) 4 MG tablet   Oral   Take 1 tablet (4 mg total) by mouth every 6 (six) hours.   12 tablet   0   . Prenatal Vit-Fe Fumarate-FA (MULTIVITAMIN-PRENATAL) 27-0.8 MG TABS tablet   Oral   Take 1 tablet by mouth daily at 12 noon.         . promethazine (PHENERGAN) 25 MG tablet   Oral   Take 1 tablet (25 mg total) by mouth every 6 (six) hours as needed for nausea or vomiting. Take 1/2 to 1 tablet every 6 hours prn nausea   30 tablet   1    BP 116/71  Pulse 92  Temp(Src) 98.2 F (36.8 C)  Resp 18  Wt 205 lb 0.4 oz (92.999 kg)  SpO2 100%  LMP 03/22/2013  Physical Exam  Nursing note and vitals reviewed. Constitutional: She is oriented to person, place, and time. She appears well-developed and well-nourished. No distress.  HENT:  Head: Normocephalic and atraumatic.  Nose: Right sinus exhibits maxillary sinus tenderness. Right sinus exhibits no frontal sinus tenderness. Left sinus exhibits maxillary sinus tenderness. Left sinus exhibits no frontal sinus tenderness.  Mouth/Throat: Uvula is midline, oropharynx is clear and moist and mucous membranes are normal.  Eyes: EOM are normal.  Neck: Neck supple. No tracheal deviation present.  Cardiovascular: Normal rate, regular rhythm and normal heart sounds.   Pulmonary/Chest: Effort normal and breath sounds normal. No respiratory distress. She has no wheezes. She has no rales.  Musculoskeletal: Normal range of motion.  Neurological: She is alert and oriented to person, place, and time.  Skin: Skin is warm and dry.  Psychiatric: She has a normal mood and affect. Her behavior is normal.    ED Course  Procedures (including critical care time)  DIAGNOSTIC STUDIES: Oxygen Saturation is 100% on RA, normal by my  interpretation.    COORDINATION OF CARE: 5:52 PM-Discussed treatment plan which includes amoxicillin with pt at bedside and pt agreed to plan.   Labs Review Labs Reviewed - No data to display Imaging Review No results found.  EKG Interpretation   None       MDM   1. Sinusitis    5:57 PM Patient likely has sinusitis. Patient will have amoxicillin for sinus infection. Vitals stable and patient afebrile.   I personally performed the services described in this documentation, which was scribed in my presence. The recorded information has been reviewed and is accurate.   Emilia Beck, PA-C 07/13/13 1758

## 2013-07-13 NOTE — Discharge Instructions (Signed)
Take Amoxicillin as directed until gone. Refer to attached documents for more information. Follow up with your doctor as needed.

## 2013-07-13 NOTE — ED Notes (Addendum)
Pt reports congestion, fever, sore throat x 4 days. Thinks she has sinus infection. Pt also 4 months pregnant.

## 2013-07-14 NOTE — ED Provider Notes (Signed)
Medical screening examination/treatment/procedure(s) were performed by non-physician practitioner and as supervising physician I was immediately available for consultation/collaboration.  EKG Interpretation   None         Jsoeph Podesta Y. Dason Mosley, MD 07/14/13 2057 

## 2013-07-28 ENCOUNTER — Encounter: Payer: Self-pay | Admitting: Family Medicine

## 2013-07-28 ENCOUNTER — Encounter: Payer: Self-pay | Admitting: Obstetrics & Gynecology

## 2013-07-28 ENCOUNTER — Ambulatory Visit (HOSPITAL_COMMUNITY)
Admission: RE | Admit: 2013-07-28 | Discharge: 2013-07-28 | Disposition: A | Discharge status: Home / Self Care | Payer: Medicaid Other | Source: Ambulatory Visit | Attending: Family Medicine | Admitting: Family Medicine

## 2013-07-28 ENCOUNTER — Ambulatory Visit (INDEPENDENT_AMBULATORY_CARE_PROVIDER_SITE_OTHER): Payer: Medicaid Other | Admitting: Obstetrics & Gynecology

## 2013-07-28 ENCOUNTER — Encounter (HOSPITAL_COMMUNITY): Payer: Self-pay

## 2013-07-28 VITALS — BP 117/75 | Temp 98.1°F | Wt 207.2 lb

## 2013-07-28 DIAGNOSIS — O99350 Diseases of the nervous system complicating pregnancy, unspecified trimester: Secondary | ICD-10-CM

## 2013-07-28 DIAGNOSIS — O358XX0 Maternal care for other (suspected) fetal abnormality and damage, not applicable or unspecified: Secondary | ICD-10-CM | POA: Insufficient documentation

## 2013-07-28 DIAGNOSIS — G40909 Epilepsy, unspecified, not intractable, without status epilepticus: Secondary | ICD-10-CM

## 2013-07-28 DIAGNOSIS — Z1389 Encounter for screening for other disorder: Secondary | ICD-10-CM | POA: Insufficient documentation

## 2013-07-28 DIAGNOSIS — Z363 Encounter for antenatal screening for malformations: Secondary | ICD-10-CM | POA: Insufficient documentation

## 2013-07-28 DIAGNOSIS — O99352 Diseases of the nervous system complicating pregnancy, second trimester: Secondary | ICD-10-CM

## 2013-07-28 LAB — POCT URINALYSIS DIP (DEVICE)
Bilirubin Urine: NEGATIVE
GLUCOSE, UA: NEGATIVE mg/dL
HGB URINE DIPSTICK: NEGATIVE
Ketones, ur: NEGATIVE mg/dL
Leukocytes, UA: NEGATIVE
Nitrite: NEGATIVE
PH: 6.5 (ref 5.0–8.0)
PROTEIN: 30 mg/dL — AB
SPECIFIC GRAVITY, URINE: 1.025 (ref 1.005–1.030)
UROBILINOGEN UA: 0.2 mg/dL (ref 0.0–1.0)

## 2013-07-28 NOTE — Patient Instructions (Signed)
Second Trimester of Pregnancy The second trimester is from week 13 through week 28, months 4 through 6. The second trimester is often a time when you feel your best. Your body has also adjusted to being pregnant, and you begin to feel better physically. Usually, morning sickness has lessened or quit completely, you may have more energy, and you may have an increase in appetite. The second trimester is also a time when the fetus is growing rapidly. At the end of the sixth month, the fetus is about 9 inches long and weighs about 1 pounds. You will likely begin to feel the baby move (quickening) between 18 and 20 weeks of the pregnancy. BODY CHANGES Your body goes through many changes during pregnancy. The changes vary from woman to woman.   Your weight will continue to increase. You will notice your lower abdomen bulging out.  You may begin to get stretch marks on your hips, abdomen, and breasts.  You may develop headaches that can be relieved by medicines approved by your caregiver.  You may urinate more often because the fetus is pressing on your bladder.  You may develop or continue to have heartburn as a result of your pregnancy.  You may develop constipation because certain hormones are causing the muscles that push waste through your intestines to slow down.  You may develop hemorrhoids or swollen, bulging veins (varicose veins).  You may have back pain because of the weight gain and pregnancy hormones relaxing your joints between the bones in your pelvis and as a result of a shift in weight and the muscles that support your balance.  Your breasts will continue to grow and be tender.  Your gums may bleed and may be sensitive to brushing and flossing.  Dark spots or blotches (chloasma, mask of pregnancy) may develop on your face. This will likely fade after the baby is born.  A dark line from your belly button to the pubic area (linea nigra) may appear. This will likely fade after the  baby is born. WHAT TO EXPECT AT YOUR PRENATAL VISITS During a routine prenatal visit:  You will be weighed to make sure you and the fetus are growing normally.  Your blood pressure will be taken.  Your abdomen will be measured to track your baby's growth.  The fetal heartbeat will be listened to.  Any test results from the previous visit will be discussed. Your caregiver may ask you:  How you are feeling.  If you are feeling the baby move.  If you have had any abnormal symptoms, such as leaking fluid, bleeding, severe headaches, or abdominal cramping.  If you have any questions. Other tests that may be performed during your second trimester include:  Blood tests that check for:  Low iron levels (anemia).  Gestational diabetes (between 24 and 28 weeks).  Rh antibodies.  Urine tests to check for infections, diabetes, or protein in the urine.  An ultrasound to confirm the proper growth and development of the baby.  An amniocentesis to check for possible genetic problems.  Fetal screens for spina bifida and Down syndrome. HOME CARE INSTRUCTIONS   Avoid all smoking, herbs, alcohol, and unprescribed drugs. These chemicals affect the formation and growth of the baby.  Follow your caregiver's instructions regarding medicine use. There are medicines that are either safe or unsafe to take during pregnancy.  Exercise only as directed by your caregiver. Experiencing uterine cramps is a good sign to stop exercising.  Continue to eat regular,   healthy meals.  Wear a good support bra for breast tenderness.  Do not use hot tubs, steam rooms, or saunas.  Wear your seat belt at all times when driving.  Avoid raw meat, uncooked cheese, cat litter boxes, and soil used by cats. These carry germs that can cause birth defects in the baby.  Take your prenatal vitamins.  Try taking a stool softener (if your caregiver approves) if you develop constipation. Eat more high-fiber foods,  such as fresh vegetables or fruit and whole grains. Drink plenty of fluids to keep your urine clear or pale yellow.  Take warm sitz baths to soothe any pain or discomfort caused by hemorrhoids. Use hemorrhoid cream if your caregiver approves.  If you develop varicose veins, wear support hose. Elevate your feet for 15 minutes, 3 4 times a day. Limit salt in your diet.  Avoid heavy lifting, wear low heel shoes, and practice good posture.  Rest with your legs elevated if you have leg cramps or low back pain.  Visit your dentist if you have not gone yet during your pregnancy. Use a soft toothbrush to brush your teeth and be gentle when you floss.  A sexual relationship may be continued unless your caregiver directs you otherwise.  Continue to go to all your prenatal visits as directed by your caregiver. SEEK MEDICAL CARE IF:   You have dizziness.  You have mild pelvic cramps, pelvic pressure, or nagging pain in the abdominal area.  You have persistent nausea, vomiting, or diarrhea.  You have a bad smelling vaginal discharge.  You have pain with urination. SEEK IMMEDIATE MEDICAL CARE IF:   You have a fever.  You are leaking fluid from your vagina.  You have spotting or bleeding from your vagina.  You have severe abdominal cramping or pain.  You have rapid weight gain or loss.  You have shortness of breath with chest pain.  You notice sudden or extreme swelling of your face, hands, ankles, feet, or legs.  You have not felt your baby move in over an hour.  You have severe headaches that do not go away with medicine.  You have vision changes. Document Released: 05/29/2001 Document Revised: 02/04/2013 Document Reviewed: 08/05/2012 [employer] Patient Information 2014 [employer].  

## 2013-07-28 NOTE — Progress Notes (Signed)
Pt [facility]  Her Lamictal level was 7.2 For anatomy scan today.

## 2013-07-28 NOTE — Progress Notes (Signed)
p=95

## 2013-08-16 ENCOUNTER — Emergency Department (HOSPITAL_COMMUNITY)
Admission: EM | Admit: 2013-08-16 | Discharge: 2013-08-17 | Disposition: A | Discharge status: Home / Self Care | Payer: Medicaid Other | Attending: Emergency Medicine | Admitting: Emergency Medicine

## 2013-08-16 ENCOUNTER — Encounter (HOSPITAL_COMMUNITY): Payer: Self-pay | Admitting: Emergency Medicine

## 2013-08-16 DIAGNOSIS — R202 Paresthesia of skin: Secondary | ICD-10-CM

## 2013-08-16 DIAGNOSIS — M259 Joint disorder, unspecified: Secondary | ICD-10-CM | POA: Insufficient documentation

## 2013-08-16 DIAGNOSIS — M25549 Pain in joints of unspecified hand: Secondary | ICD-10-CM | POA: Insufficient documentation

## 2013-08-16 DIAGNOSIS — M899 Disorder of bone, unspecified: Secondary | ICD-10-CM | POA: Insufficient documentation

## 2013-08-16 DIAGNOSIS — J45909 Unspecified asthma, uncomplicated: Secondary | ICD-10-CM | POA: Insufficient documentation

## 2013-08-16 DIAGNOSIS — O9989 Other specified diseases and conditions complicating pregnancy, childbirth and the puerperium: Secondary | ICD-10-CM | POA: Insufficient documentation

## 2013-08-16 DIAGNOSIS — F410 Panic disorder [episodic paroxysmal anxiety] without agoraphobia: Secondary | ICD-10-CM | POA: Insufficient documentation

## 2013-08-16 DIAGNOSIS — Z79899 Other long term (current) drug therapy: Secondary | ICD-10-CM | POA: Insufficient documentation

## 2013-08-16 DIAGNOSIS — O99350 Diseases of the nervous system complicating pregnancy, unspecified trimester: Secondary | ICD-10-CM

## 2013-08-16 DIAGNOSIS — R209 Unspecified disturbances of skin sensation: Secondary | ICD-10-CM | POA: Insufficient documentation

## 2013-08-16 DIAGNOSIS — O99340 Other mental disorders complicating pregnancy, unspecified trimester: Secondary | ICD-10-CM | POA: Insufficient documentation

## 2013-08-16 DIAGNOSIS — Z8679 Personal history of other diseases of the circulatory system: Secondary | ICD-10-CM | POA: Insufficient documentation

## 2013-08-16 DIAGNOSIS — M25449 Effusion, unspecified hand: Secondary | ICD-10-CM | POA: Insufficient documentation

## 2013-08-16 DIAGNOSIS — G40909 Epilepsy, unspecified, not intractable, without status epilepticus: Secondary | ICD-10-CM | POA: Insufficient documentation

## 2013-08-16 DIAGNOSIS — O99330 Smoking (tobacco) complicating pregnancy, unspecified trimester: Secondary | ICD-10-CM | POA: Insufficient documentation

## 2013-08-16 LAB — URINALYSIS, ROUTINE W REFLEX MICROSCOPIC
Bilirubin Urine: NEGATIVE
Glucose, UA: NEGATIVE mg/dL
HGB URINE DIPSTICK: NEGATIVE
Ketones, ur: NEGATIVE mg/dL
Leukocytes, UA: NEGATIVE
Nitrite: NEGATIVE
PH: 7 (ref 5.0–8.0)
Protein, ur: NEGATIVE mg/dL
SPECIFIC GRAVITY, URINE: 1.03 (ref 1.005–1.030)
UROBILINOGEN UA: 1 mg/dL (ref 0.0–1.0)

## 2013-08-16 LAB — PREGNANCY, URINE: Preg Test, Ur: POSITIVE — AB

## 2013-08-16 MED ORDER — ACETAMINOPHEN 325 MG PO TABS
650.0000 mg | ORAL_TABLET | Freq: Once | ORAL | Status: AC
  Administered 2013-08-16: 650 mg via ORAL
  Filled 2013-08-16: qty 2

## 2013-08-16 NOTE — ED Notes (Addendum)
Patient reports pain has not been relieved by Tylenol.  Informed the patient that pain management will have to account for the pregnancy as well.

## 2013-08-16 NOTE — ED Notes (Signed)
Pt. is currently at pediatric ER with her child.

## 2013-08-16 NOTE — ED Notes (Signed)
Notified Dr. Rubin PayorPickering of patient complaining of pain, and requesting medication.  Also pregnant patient.  MD will allow Tylenol only at this time.

## 2013-08-16 NOTE — ED Notes (Signed)
Pt came in with c/o pain to both hands for 3 weeks that is worse in the morning.  Mother says it sometimes feels numb and tingly.  Pain is worse when pt squeezes hand. Pt is 5 months pregnant and has history of seizures.  Pt awake and alert.  Denies any discharge and says she feels the baby moving.

## 2013-08-16 NOTE — ED Notes (Signed)
Patient reports she works at [employer] with her hands in water all day for 12 hours.  Her bilateral hand pain usually is the worst when she first wakes up in the mornings.  She reports elevating them for sleep, and has not taken medication due to pregnancy.  She will be 5 months on August 20, 2013.

## 2013-08-17 LAB — CBC
HEMATOCRIT: 35.8 % — AB (ref 36.0–46.0)
Hemoglobin: 12.8 g/dL (ref 12.0–15.0)
MCH: 32.2 pg (ref 26.0–34.0)
MCHC: 35.8 g/dL (ref 30.0–36.0)
MCV: 90.2 fL (ref 78.0–100.0)
Platelets: 268 10*3/uL (ref 150–400)
RBC: 3.97 MIL/uL (ref 3.87–5.11)
RDW: 13.9 % (ref 11.5–15.5)
WBC: 8.2 10*3/uL (ref 4.0–10.5)

## 2013-08-17 LAB — BASIC METABOLIC PANEL
BUN: 9 mg/dL (ref 6–23)
CO2: 23 mEq/L (ref 19–32)
Calcium: 9 mg/dL (ref 8.4–10.5)
Chloride: 99 mEq/L (ref 96–112)
Creatinine, Ser: 0.49 mg/dL — ABNORMAL LOW (ref 0.50–1.10)
GFR calc non Af Amer: >90 mL/min (ref >90)
Glucose, Bld: 73 mg/dL (ref 70–99)
POTASSIUM: 3.8 meq/L (ref 3.7–5.3)
Sodium: 135 mEq/L — ABNORMAL LOW (ref 137–147)

## 2013-08-17 NOTE — ED Provider Notes (Signed)
Medical screening examination/treatment/procedure(s) were conducted as a shared visit with non-physician practitioner(s) and myself.  I personally evaluated the patient during the encounter.   EKG Interpretation None      She presents with several months of bilateral hand paresthesias. Cells concerned that maybe some swelling. No acute injury appreciated. Concern for possible carpal tunnel syndrome. Placed in respiratory system followup with hand. Return precautions given.  Loren Raceravid Haifa Hatton, MD 08/17/13 [phone]

## 2013-08-17 NOTE — ED Notes (Signed)
Contacted ortho for appllication of wrist splints.

## 2013-08-17 NOTE — Progress Notes (Signed)
Orthopedic Tech Progress Note Patient Details:  Hannah SchullerDevonda A Cook 04/03/1981 [id number]  Ortho Devices Type of Ortho Device: Wrist splint   Haskell Flirtewsome, Jenniferlynn Saad M 08/17/2013, 12:49 AM

## 2013-08-17 NOTE — Discharge Instructions (Signed)
1. Medications: usual home medications, tylenol for pain 2. Treatment: rest, drink plenty of fluids,  3. Follow Up: Please followup with your primary doctor for discussion of your diagnoses and further evaluation after today's visit; [facility]   Carpal Tunnel Syndrome The carpal tunnel is a narrow area located on the palm side of your wrist. The tunnel is formed by the wrist bones and ligaments. Nerves, blood vessels, and tendons pass through the carpal tunnel. Repeated wrist motion or certain diseases may cause swelling within the tunnel. This swelling pinches the main nerve in the wrist (median nerve) and causes the painful hand and arm condition called carpal tunnel syndrome. CAUSES   Repeated wrist motions.  Wrist injuries.  Certain diseases like arthritis, diabetes, alcoholism, hyperthyroidism, and kidney failure.  Obesity.  Pregnancy. SYMPTOMS   A "pins and needles" feeling in your fingers or hand.  Tingling or numbness in your fingers or hand.  An aching feeling in your entire arm.  Wrist pain that goes up your arm to your shoulder.  Pain that goes down into your palm or fingers.  A weak feeling in your hands. DIAGNOSIS  Your caregiver will take your history and perform a physical exam. An electromyography test may be needed. This test measures electrical signals sent out by the muscles. The electrical signals are usually slowed by carpal tunnel syndrome. You may also need X-rays. TREATMENT  Carpal tunnel syndrome may clear up by itself. Your caregiver may recommend a wrist splint or medicine such as a nonsteroidal anti-inflammatory medicine. Cortisone injections may help. Sometimes, surgery may be needed to free the pinched nerve.  HOME CARE INSTRUCTIONS   Take all medicine as directed by your caregiver. Only take over-the-counter or prescription medicines for pain, discomfort, or fever as directed by your  caregiver.  If you were given a splint to keep your wrist from bending, wear it as directed. It is important to wear the splint at night. Wear the splint for as long as you have pain or numbness in your hand, arm, or wrist. This may take 1 to 2 months.  Rest your wrist from any activity that may be causing your pain. If your symptoms are work-related, you may need to talk to your employer about changing to a job that does not require using your wrist.  Put ice on your wrist after long periods of wrist activity.  Put ice in a plastic bag.  Place a towel between your skin and the bag.  Leave the ice on for 15-20 minutes, 03-04 times a day.  Keep all follow-up visits as directed by your caregiver. This includes any orthopedic referrals, physical therapy, and rehabilitation. Any delay in getting necessary care could result in a delay or failure of your condition to heal. SEEK IMMEDIATE MEDICAL CARE IF:   You have new, unexplained symptoms.  Your symptoms get worse and are not helped or controlled with medicines. MAKE SURE YOU:   Understand these instructions.  Will watch your condition.  Will get help right away if you are not doing well or get worse. Document Released: 06/01/2000 Document Revised: 08/27/2011 Document Reviewed: 04/20/2011 [facility] Patient Information 2014 [employer] [place].

## 2013-08-17 NOTE — ED Provider Notes (Signed)
CSN: [id number]     Arrival date & time 08/16/13  1959 History   First MD Initiated Contact with Patient 08/16/13 2235     Chief Complaint  Patient presents with  . Hand Pain  . Numbness     (Consider location/radiation/quality/duration/timing/severity/associated sxs/prior Treatment) Patient is a 33 y.o. female presenting with hand pain. The history is provided by the patient and medical records. No language interpreter was used.  Hand Pain Associated symptoms include arthralgias and joint swelling. Pertinent negatives include no abdominal pain, chest pain, chills, coughing, diaphoresis, fatigue, fever, headaches, nausea, neck pain, numbness, rash or vomiting.    Hannah Cook is a 33 y.o. female  with a hx of asthma, aneurysm and stress-induced seizures presents to the Emergency Department complaining of gradual, persistent, progressively worsening bilateral hand swelling, pain and paresthesias onset approximately 2 months ago when she began a new job working at [employer] in the reason for her. She reports it has increased significantly in the last 3 weeks. Associated symptoms include color change in her hands. She has not attempted any over-the-counter medications for these symptoms. Nothing makes it better or worse. Patient denies fever, chills, headache, neck pain, chest pain, shortness of breath, abdominal pain, nausea, vomiting, diarrhea, weakness, dizziness, syncope, dysuria, hematuria, peripheral edema in her legs.    Past Medical History  Diagnosis Date  . Asthma   . Aneurysm   . Panic attack   . 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 Brain surgery    . Tonsillectomy    . Aneurysm coiling     Family History  Problem Relation Age of Onset  . Thyroid disease Mother   . Aortic aneurysm Mother     heart   History  Substance Use Topics  . Smoking status: Current Every Day Smoker -- 1.00  packs/day    Types: Cigarettes  . Smokeless tobacco: Never Used  . Alcohol Use: No   OB History   Grav Para Term Preterm Abortions TAB SAB Ect Mult Living   3 2 2       2      Review of Systems  Constitutional: Negative for fever, chills, diaphoresis, appetite change, fatigue and unexpected weight change.  HENT: Negative for mouth sores.   Eyes: Negative for visual disturbance.  Respiratory: Negative for cough, chest tightness, shortness of breath and wheezing.   Cardiovascular: Negative for chest pain.  Gastrointestinal: Negative for nausea, vomiting, abdominal pain, diarrhea and constipation.  Endocrine: Negative for polydipsia, polyphagia and polyuria.  Genitourinary: Negative for dysuria, urgency, frequency and hematuria.  Musculoskeletal: Positive for arthralgias and joint swelling. Negative for back pain, neck pain and neck stiffness.  Skin: Negative for rash and wound.  Allergic/Immunologic: Negative for immunocompromised state.  Neurological: Negative for syncope, light-headedness, numbness and headaches.  Hematological: Does not bruise/bleed easily.  Psychiatric/Behavioral: Negative for sleep disturbance. The patient is not nervous/anxious.   All other systems reviewed and are negative.      Allergies  Hydroxacen and Vicodin  Home Medications   Current Outpatient Rx  Name  Route  Sig  Dispense  Refill  . ALPRAZolam (XANAX) 1 MG tablet   Oral   Take 1 mg by mouth at bedtime as needed for anxiety or sleep. For anxiety         . lamoTRIgine (LAMICTAL) 150 MG tablet   Oral   Take 150 mg  by mouth 4 (four) times daily.          . Prenatal Vit-Fe Fumarate-FA (MULTIVITAMIN-PRENATAL) 27-0.8 MG TABS tablet   Oral   Take 1 tablet by mouth 4 (four) times daily. Takes four times a day per patient          BP 115/75  Pulse 100  Temp(Src) 97.6 F (36.4 C) (Oral)  Resp 18  Wt 213 lb 13.5 oz (97 kg)  SpO2 100%  LMP 03/22/2013 Physical Exam  Nursing note and  vitals reviewed. Constitutional: She appears well-developed and well-nourished. No distress.  Awake, alert, nontoxic appearance  HENT:  Head: Normocephalic and atraumatic.  Mouth/Throat: Oropharynx is clear and moist. No oropharyngeal exudate.  Eyes: Conjunctivae are normal. No scleral icterus.  Neck: Normal range of motion. Neck supple.  Cardiovascular: Normal rate, regular rhythm, normal heart sounds and intact distal pulses.   Capillary refill approximately 5 seconds in the bilateral hands; less than 3 seconds in the bilateral feet  Pulmonary/Chest: Effort normal and breath sounds normal. No respiratory distress. She has no wheezes.  Clear and equal breath sounds  Abdominal: Soft. Bowel sounds are normal. She exhibits no mass. There is no tenderness. There is no rebound and no guarding.  Gravid uterus  Musculoskeletal: Normal range of motion. She exhibits edema (hands) and tenderness.  ROM: Full range of motion of all fingers and bilateral wrists with pain Nonpitting edema noted to hands; no edema forearms  Normal Phalen's and Tinel's  Neurological: She is alert. Coordination normal.  Sensation intact to dull and sharp Strength 5 out of 5 in the bilateral upper extremities including strong and equal grip strength  Skin: Skin is warm and dry. She is not diaphoretic.  No tenting of the skin   Psychiatric: She has a normal mood and affect.    ED Course  Procedures (including critical care time) Labs Review Labs Reviewed  PREGNANCY, URINE - Abnormal; Notable for the following:    Preg Test, Ur POSITIVE (*)    All other components within normal limits  CBC - Abnormal; Notable for the following:    HCT 35.8 (*)    All other components within normal limits  BASIC METABOLIC PANEL - Abnormal; Notable for the following:    Sodium 135 (*)    Creatinine, Ser 0.49 (*)    All other components within normal limits  URINALYSIS, ROUTINE W REFLEX MICROSCOPIC   Imaging Review No results  found.   EKG Interpretation None      MDM   Final diagnoses:  Paresthesias in left hand  Paresthesias in right hand   Hannah Cook presents with hand pain and swelling worse in the last 3 weeks.  This is not the patient's first pregnancy.  No swelling in her feet.  FHT - 138  No electrolyte abnormalities seen.  Will splint hands; likely carpal tunnel syndrome.  Recommend hand follow-up.    It has been determined that no acute conditions requiring further emergency intervention are present at this time. The patient/guardian have been advised of the diagnosis and plan. We have discussed signs and symptoms that warrant return to the ED, such as changes or worsening in symptoms.   Vital signs are stable at discharge.   BP 115/75  Pulse 100  Temp(Src) 97.6 F (36.4 C) (Oral)  Resp 18  Wt 213 lb 13.5 oz (97 kg)  SpO2 100%  LMP 03/22/2013  Patient/guardian has voiced understanding and agreed to follow-up with the PCP or  specialist.      Dierdre Forth, PA-C 08/17/13 [phone]

## 2013-08-18 ENCOUNTER — Other Ambulatory Visit: Payer: Self-pay | Admitting: Obstetrics & Gynecology

## 2013-08-18 DIAGNOSIS — O9989 Other specified diseases and conditions complicating pregnancy, childbirth and the puerperium: Secondary | ICD-10-CM

## 2013-08-25 ENCOUNTER — Ambulatory Visit (HOSPITAL_COMMUNITY)
Admission: RE | Admit: 2013-08-25 | Discharge: 2013-08-25 | Disposition: A | Discharge status: Home / Self Care | Payer: Medicaid Other | Source: Ambulatory Visit | Attending: Obstetrics & Gynecology | Admitting: Obstetrics & Gynecology

## 2013-08-25 ENCOUNTER — Encounter: Payer: Medicaid Other | Admitting: Obstetrics & Gynecology

## 2013-08-25 ENCOUNTER — Ambulatory Visit (HOSPITAL_COMMUNITY): Payer: Medicaid Other

## 2013-08-25 VITALS — BP 124/73 | HR 92 | Wt 212.0 lb

## 2013-08-25 DIAGNOSIS — O99350 Diseases of the nervous system complicating pregnancy, unspecified trimester: Secondary | ICD-10-CM

## 2013-08-25 DIAGNOSIS — G40909 Epilepsy, unspecified, not intractable, without status epilepticus: Secondary | ICD-10-CM | POA: Insufficient documentation

## 2013-08-25 DIAGNOSIS — O99280 Endocrine, nutritional and metabolic diseases complicating pregnancy, unspecified trimester: Principal | ICD-10-CM

## 2013-08-25 DIAGNOSIS — Z3480 Encounter for supervision of other normal pregnancy, unspecified trimester: Secondary | ICD-10-CM

## 2013-08-25 DIAGNOSIS — E079 Disorder of thyroid, unspecified: Secondary | ICD-10-CM | POA: Insufficient documentation

## 2013-08-25 DIAGNOSIS — O9989 Other specified diseases and conditions complicating pregnancy, childbirth and the puerperium: Secondary | ICD-10-CM

## 2013-08-30 ENCOUNTER — Encounter: Payer: Self-pay | Admitting: Obstetrics & Gynecology

## 2013-09-08 ENCOUNTER — Encounter: Payer: Self-pay | Admitting: Obstetrics & Gynecology

## 2013-09-08 ENCOUNTER — Encounter: Payer: Medicaid Other | Admitting: Obstetrics and Gynecology

## 2013-09-24 ENCOUNTER — Ambulatory Visit (INDEPENDENT_AMBULATORY_CARE_PROVIDER_SITE_OTHER): Payer: Medicaid Other | Admitting: Family Medicine

## 2013-09-24 VITALS — BP 109/75 | Temp 98.1°F | Wt 209.9 lb

## 2013-09-24 DIAGNOSIS — Z23 Encounter for immunization: Secondary | ICD-10-CM

## 2013-09-24 DIAGNOSIS — O99350 Diseases of the nervous system complicating pregnancy, unspecified trimester: Secondary | ICD-10-CM

## 2013-09-24 DIAGNOSIS — G40909 Epilepsy, unspecified, not intractable, without status epilepticus: Secondary | ICD-10-CM

## 2013-09-24 DIAGNOSIS — Z3480 Encounter for supervision of other normal pregnancy, unspecified trimester: Secondary | ICD-10-CM

## 2013-09-24 LAB — POCT URINALYSIS DIP (DEVICE)
Bilirubin Urine: NEGATIVE
Glucose, UA: NEGATIVE mg/dL
HGB URINE DIPSTICK: NEGATIVE
Leukocytes, UA: NEGATIVE
Nitrite: NEGATIVE
Protein, ur: 30 mg/dL — AB
UROBILINOGEN UA: 0.2 mg/dL (ref 0.0–1.0)
pH: 6 (ref 5.0–8.0)

## 2013-09-24 LAB — CBC
HEMATOCRIT: 32.2 % — AB (ref 36.0–46.0)
Hemoglobin: 11.5 g/dL — ABNORMAL LOW (ref 12.0–15.0)
MCH: 32.1 pg (ref 26.0–34.0)
MCHC: 35.7 g/dL (ref 30.0–36.0)
MCV: 89.9 fL (ref 78.0–100.0)
Platelets: 244 10*3/uL (ref 150–400)
RBC: 3.58 MIL/uL — ABNORMAL LOW (ref 3.87–5.11)
RDW: 15.1 % (ref 11.5–15.5)
WBC: 5.7 10*3/uL (ref 4.0–10.5)

## 2013-09-24 MED ORDER — TETANUS-DIPHTH-ACELL PERTUSSIS 5-2.5-18.5 LF-MCG/0.5 IM SUSP: 0.5000 mL | Freq: Once | INTRAMUSCULAR | Status: DC

## 2013-09-24 NOTE — Progress Notes (Signed)
P-96 

## 2013-09-24 NOTE — Progress Notes (Signed)
S: 33 yo G3P2002 @ 4949w4d - seen by neurology last month and dropped lamictal dose. Now on one in the AM and 2 at night - no seizures - no ctx, lof, vb. +FM  O:see flowsheet  A/P - has missed 2 months of care - ?VSD on anatomy scan but pt states had an echo and cardiology said it was fine. Will request records as none in the system.  - will request records from neurology again at [facility] for hx of epilepsy - discussed need for routine f/u  - f/u in 2 weeks - 28 week labs done today

## 2013-09-25 ENCOUNTER — Encounter: Payer: Self-pay | Admitting: Family Medicine

## 2013-09-25 LAB — GLUCOSE TOLERANCE, 1 HOUR (50G) W/O FASTING: Glucose, 1 Hour GTT: 84 mg/dL (ref 70–140)

## 2013-09-25 LAB — RPR

## 2013-09-25 LAB — HIV ANTIBODY (ROUTINE TESTING W REFLEX): HIV 1&2 Ab, 4th Generation: NONREACTIVE

## 2013-10-08 ENCOUNTER — Encounter: Payer: Self-pay | Admitting: Obstetrics & Gynecology

## 2013-10-08 ENCOUNTER — Ambulatory Visit (INDEPENDENT_AMBULATORY_CARE_PROVIDER_SITE_OTHER): Payer: Medicaid Other | Admitting: Obstetrics & Gynecology

## 2013-10-08 VITALS — BP 95/59 | Temp 97.7°F | Wt 214.4 lb

## 2013-10-08 DIAGNOSIS — O09899 Supervision of other high risk pregnancies, unspecified trimester: Secondary | ICD-10-CM

## 2013-10-08 DIAGNOSIS — O358XX0 Maternal care for other (suspected) fetal abnormality and damage, not applicable or unspecified: Secondary | ICD-10-CM

## 2013-10-08 DIAGNOSIS — O99333 Smoking (tobacco) complicating pregnancy, third trimester: Secondary | ICD-10-CM | POA: Insufficient documentation

## 2013-10-08 DIAGNOSIS — O359XX0 Maternal care for (suspected) fetal abnormality and damage, unspecified, not applicable or unspecified: Secondary | ICD-10-CM | POA: Insufficient documentation

## 2013-10-08 DIAGNOSIS — O99350 Diseases of the nervous system complicating pregnancy, unspecified trimester: Principal | ICD-10-CM

## 2013-10-08 DIAGNOSIS — G40909 Epilepsy, unspecified, not intractable, without status epilepticus: Secondary | ICD-10-CM

## 2013-10-08 DIAGNOSIS — O99330 Smoking (tobacco) complicating pregnancy, unspecified trimester: Secondary | ICD-10-CM

## 2013-10-08 DIAGNOSIS — Z3480 Encounter for supervision of other normal pregnancy, unspecified trimester: Secondary | ICD-10-CM

## 2013-10-08 LAB — POCT URINALYSIS DIP (DEVICE)
Bilirubin Urine: NEGATIVE
Glucose, UA: NEGATIVE mg/dL
KETONES UR: NEGATIVE mg/dL
Leukocytes, UA: NEGATIVE
NITRITE: NEGATIVE
PH: 6.5 (ref 5.0–8.0)
Protein, ur: NEGATIVE mg/dL
Specific Gravity, Urine: 1.025 (ref 1.005–1.030)
Urobilinogen, UA: 0.2 mg/dL (ref 0.0–1.0)

## 2013-10-08 MED ORDER — NICOTINE 21 MG/24HR TD PT24: 21.0000 mg | MEDICATED_PATCH | Freq: Every day | TRANSDERMAL | Status: DC

## 2013-10-08 NOTE — Progress Notes (Signed)
P=97  Is questioning receiving a patch to help with smoking cessation

## 2013-10-08 NOTE — Progress Notes (Signed)
Fetal Echo scheduled 10/20/13 at 11 am with Dr. Elizebeth Brookingotton. Auth # [id number] Case # [id number].

## 2013-10-08 NOTE — Patient Instructions (Signed)
Return to clinic for any obstetric concerns or go to MAU for evaluation  

## 2013-10-08 NOTE — Progress Notes (Signed)
?  VSD on 19 week fetal ECHO, repeat evaluation ordered Nicotine patch ordered as per patient preference, smokes 2 ppd No other complaints or concerns.  Fetal movement and labor precautions reviewed.

## 2013-10-29 ENCOUNTER — Ambulatory Visit (INDEPENDENT_AMBULATORY_CARE_PROVIDER_SITE_OTHER): Payer: Medicaid Other | Admitting: Family Medicine

## 2013-10-29 VITALS — BP 110/78 | HR 103 | Temp 97.1°F | Wt 215.8 lb

## 2013-10-29 DIAGNOSIS — O358XX0 Maternal care for other (suspected) fetal abnormality and damage, not applicable or unspecified: Secondary | ICD-10-CM

## 2013-10-29 DIAGNOSIS — Z3480 Encounter for supervision of other normal pregnancy, unspecified trimester: Secondary | ICD-10-CM

## 2013-10-29 DIAGNOSIS — O99330 Smoking (tobacco) complicating pregnancy, unspecified trimester: Secondary | ICD-10-CM

## 2013-10-29 DIAGNOSIS — O99333 Smoking (tobacco) complicating pregnancy, third trimester: Secondary | ICD-10-CM

## 2013-10-29 DIAGNOSIS — O09899 Supervision of other high risk pregnancies, unspecified trimester: Secondary | ICD-10-CM

## 2013-10-29 DIAGNOSIS — O99350 Diseases of the nervous system complicating pregnancy, unspecified trimester: Principal | ICD-10-CM

## 2013-10-29 DIAGNOSIS — G40909 Epilepsy, unspecified, not intractable, without status epilepticus: Secondary | ICD-10-CM

## 2013-10-29 DIAGNOSIS — O359XX0 Maternal care for (suspected) fetal abnormality and damage, unspecified, not applicable or unspecified: Secondary | ICD-10-CM

## 2013-10-29 LAB — POCT URINALYSIS DIP (DEVICE)
Glucose, UA: NEGATIVE mg/dL
Ketones, ur: NEGATIVE mg/dL
Leukocytes, UA: NEGATIVE
Nitrite: NEGATIVE
PROTEIN: 30 mg/dL — AB
Specific Gravity, Urine: 1.025 (ref 1.005–1.030)
Urobilinogen, UA: 0.2 mg/dL (ref 0.0–1.0)
pH: 6.5 (ref 5.0–8.0)

## 2013-10-29 NOTE — Patient Instructions (Signed)
Offices that do circumcisions: [facility] [phone] [facility]([place]) $317.20 within 4 weeks of birth, [facility] [phone] [facility]([place]) $250 within 7 days of birth, [facility] [phone] [facility]([place]) $175 within 2 weeks of birth

## 2013-10-29 NOTE — Progress Notes (Signed)
S: 33 yo G3P2002 @ 8172w4d  - fetal echo on 5/5 missed but has a reschedule on 5/20 - having some swelling but improves with elevation - no ctx, lof, vb. +FM.   O: see flowsheet   A/P - discussed elevation for feet  - urged to keep echo appt - will f/u with testing recommendations based on echo  - f/u with us in 2 weeks.

## 2013-11-01 ENCOUNTER — Other Ambulatory Visit: Payer: Self-pay | Admitting: Obstetrics and Gynecology

## 2013-11-09 ENCOUNTER — Encounter (HOSPITAL_COMMUNITY): Payer: Self-pay | Admitting: Emergency Medicine

## 2013-11-09 ENCOUNTER — Emergency Department (HOSPITAL_COMMUNITY)
Admission: EM | Admit: 2013-11-09 | Discharge: 2013-11-09 | Discharge status: Against Medical Advice | Payer: Medicaid Other | Attending: Emergency Medicine | Admitting: Emergency Medicine

## 2013-11-09 DIAGNOSIS — M25539 Pain in unspecified wrist: Secondary | ICD-10-CM | POA: Insufficient documentation

## 2013-11-09 DIAGNOSIS — M259 Joint disorder, unspecified: Secondary | ICD-10-CM | POA: Insufficient documentation

## 2013-11-09 DIAGNOSIS — M899 Disorder of bone, unspecified: Principal | ICD-10-CM | POA: Insufficient documentation

## 2013-11-09 DIAGNOSIS — O99330 Smoking (tobacco) complicating pregnancy, unspecified trimester: Secondary | ICD-10-CM | POA: Insufficient documentation

## 2013-11-09 DIAGNOSIS — J45909 Unspecified asthma, uncomplicated: Secondary | ICD-10-CM | POA: Insufficient documentation

## 2013-11-09 DIAGNOSIS — O9989 Other specified diseases and conditions complicating pregnancy, childbirth and the puerperium: Secondary | ICD-10-CM | POA: Insufficient documentation

## 2013-11-09 NOTE — ED Notes (Signed)
Pt reports she has been seen before for carpal tunnel, reports bil wrist pain 8/10. Pt pregnant, denies headache or abdominal pain.

## 2013-11-09 NOTE — ED Notes (Signed)
Notified by staff that patient left AMA. Not certain of what prompted the decision. Attending and charge nurse notified.

## 2013-11-09 NOTE — ED Notes (Signed)
Patient denies injury and is not sure if the edema is secondary to pregnancy or the carpal tunnel. She is wearing a brace to right wrist, but complains of pain in both. She states tylenol is ineffective.

## 2013-11-11 ENCOUNTER — Encounter: Payer: Self-pay | Admitting: *Deleted

## 2013-11-11 ENCOUNTER — Encounter: Payer: Self-pay | Admitting: General Practice

## 2013-11-13 ENCOUNTER — Encounter: Payer: Self-pay | Admitting: General Practice

## 2013-11-14 ENCOUNTER — Inpatient Hospital Stay (HOSPITAL_COMMUNITY)
Admission: AD | Admit: 2013-11-14 | Discharge: 2013-11-14 | Disposition: A | Discharge status: Home / Self Care | Payer: Medicaid Other | Source: Ambulatory Visit | Attending: Obstetrics and Gynecology | Admitting: Obstetrics and Gynecology

## 2013-11-14 ENCOUNTER — Encounter (HOSPITAL_COMMUNITY): Payer: Self-pay | Admitting: *Deleted

## 2013-11-14 DIAGNOSIS — Z87891 Personal history of nicotine dependence: Secondary | ICD-10-CM | POA: Insufficient documentation

## 2013-11-14 DIAGNOSIS — G5600 Carpal tunnel syndrome, unspecified upper limb: Secondary | ICD-10-CM | POA: Insufficient documentation

## 2013-11-14 DIAGNOSIS — O99891 Other specified diseases and conditions complicating pregnancy: Secondary | ICD-10-CM | POA: Insufficient documentation

## 2013-11-14 DIAGNOSIS — IMO0002 Reserved for concepts with insufficient information to code with codable children: Secondary | ICD-10-CM | POA: Insufficient documentation

## 2013-11-14 DIAGNOSIS — O1203 Gestational edema, third trimester: Secondary | ICD-10-CM

## 2013-11-14 DIAGNOSIS — R209 Unspecified disturbances of skin sensation: Secondary | ICD-10-CM | POA: Insufficient documentation

## 2013-11-14 DIAGNOSIS — O9989 Other specified diseases and conditions complicating pregnancy, childbirth and the puerperium: Secondary | ICD-10-CM

## 2013-11-14 LAB — URINALYSIS, ROUTINE W REFLEX MICROSCOPIC
Bilirubin Urine: NEGATIVE
GLUCOSE, UA: NEGATIVE mg/dL
HGB URINE DIPSTICK: NEGATIVE
KETONES UR: 15 mg/dL — AB
Leukocytes, UA: NEGATIVE
Nitrite: NEGATIVE
Protein, ur: NEGATIVE mg/dL
Specific Gravity, Urine: 1.025 (ref 1.005–1.030)
Urobilinogen, UA: 0.2 mg/dL (ref 0.0–1.0)
pH: 6 (ref 5.0–8.0)

## 2013-11-14 NOTE — MAU Provider Note (Signed)
History  Chief Complaint:  swelling in hands and feet   Hannah Cook is a 33 y.o. G54P2002 female at [redacted]w[redacted]d presenting w/ report of swelling feet/legs/hands. +numbness/tingling in hands- has bilateral splints for carpal tunnel syndrome. Denies ha, scotomata, ruq/epigastric pain, n/v.  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 Doesn't want to cut back hours currently.   Reports active fetal movement, contractions: none, vaginal bleeding: none, membranes: intact. Denies uti s/s, abnormal/malodorous vag d/c or vulvovaginal itching/irritation.   [facility] at [facility]  Next visit 6/2. Pregnancy complicated by seizure disorder- on Lamictal, ?VSD on fetal echo @ 19wks- repeat normal, was smoking 2ppd cigarettes- rx'd nicotine patch 4/23.  Obstetrical History: OB History   Grav Para Term Preterm Abortions TAB SAB Ect Mult Living   3 2 2       2       Past Medical History: Past Medical History  Diagnosis Date  . Asthma   . Aneurysm   . Panic attack   . Seizures     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    Past Surgical History: Past Surgical History  Procedure Laterality Date  . Brain surgery    . Tonsillectomy    . Aneurysm coiling      Social History: History   Social History  . Marital Status: Single    Spouse Name: N/A    Number of Children: N/A  . Years of Education: N/A   Social History Main Topics  . Smoking status: Current Every Day Smoker -- 1.00 packs/day    Types: Cigarettes  . Smokeless tobacco: Never Used  . Alcohol Use: No  . Drug Use: No  . Sexual Activity: Yes    Birth Control/ Protection: Condom   Other Topics Concern  . None   Social History Narrative  . None    Allergies: Allergies  Allergen Reactions  . Hydroxacen [Hydroxyzine] Itching and Nausea And Vomiting  . Vicodin [Hydrocodone-Acetaminophen] Itching and Nausea And Vomiting    Headache.  Headache.     Prescriptions prior to admission  Medication Sig Dispense Refill   . ALPRAZolam (XANAX) 1 MG tablet Take 1 mg by mouth at bedtime as needed for anxiety or sleep. For anxiety      . folic acid (FOLVITE) 1 MG tablet take 4 tablets by mouth once daily  60 tablet  3  . lamoTRIgine (LAMICTAL) 150 MG tablet Take 150 mg by mouth 4 (four) times daily.         Review of Systems  Pertinent pos/neg as indicated in HPI  Physical Exam  Blood pressure 115/67, pulse 105, temperature 98.1 F (36.7 C), resp. rate 16, last menstrual period 03/22/2013. General appearance: alert, cooperative and no distress Lungs: clear to auscultation bilaterally, normal effort Heart: regular rate and rhythm Abdomen: gravid, soft, non-tender Extremities: 2-3+ BLE edema up to mid shin, + swelling bilateral hands DTR's 2+  Fetal monitoring: FHR: 135 bpm, variability: moderate,  Accelerations: Present,  decelerations:  Absent Uterine activity: irregular, not perceived by pt  Results for orders placed during the hospital encounter of 11/14/13 (from the past 24 hour(s))  URINALYSIS, ROUTINE W REFLEX MICROSCOPIC     Status: Abnormal   Collection Time    11/14/13  6:09 PM      Result Value Ref Range   Color, Urine YELLOW  YELLOW   APPearance CLEAR  CLEAR   Specific Gravity, Urine 1.025  1.005 - 1.030   pH 6.0  5.0 -  8.0   Glucose, UA NEGATIVE  NEGATIVE mg/dL   Hgb urine dipstick NEGATIVE  NEGATIVE   Bilirubin Urine NEGATIVE  NEGATIVE   Ketones, ur 15 (*) NEGATIVE mg/dL   Protein, ur NEGATIVE  NEGATIVE mg/dL   Urobilinogen, UA 0.2  0.0 - 1.0 mg/dL   Nitrite NEGATIVE  NEGATIVE   Leukocytes, UA NEGATIVE  NEGATIVE     MAU Course  NST Exam  Assessment and Plan  A:  7314w6d SIUP  G3P2002  BLE & BUE edema a/w pregnancy, normal BP, no proteinuria  Cat 1 FHR P:  D/C home  Reviewed edema relief measures, elevating extremities, po fluids, baths, compression stockings, decreasing work hours if needed. Also discussed ptl s/s, fkc  [facility] as  scheduled   Marge DuncansKimberly Randall Kahil Agner CNM,WHNP-BC 5/30/20156:45 PM

## 2013-11-14 NOTE — MAU Note (Signed)
Pt presents to MAU with complaints of pain and swelling in her hands and feet. States it hurts for her to walk. Denies any complications with the pregnancy.

## 2013-11-14 NOTE — Discharge Instructions (Signed)
For swelling: elevate arms/legs above level of heart, stay hydrated with water, can try soaking in luke-warm bath, compression stockings- put on with legs up in air, can decrease work hours if needed  Call the clinic or go to [facility] hospital for these signs of pre-eclampsia:  Severe headache that does not go away with Tylenol  Visual changes- seeing spots, double, blurred vision  Pain under your right breast or upper abdomen that does not go away with Tums or heartburn medicine  Nausea and/or vomiting  Severe swelling in your hands, feet, and face     Preterm Labor Information Preterm labor is when labor starts at less than 37 weeks of pregnancy. The normal length of a pregnancy is 39 to 41 weeks. CAUSES Often, there is no identifiable underlying cause as to why a woman goes into preterm labor. One of the most common known causes of preterm labor is infection. Infections of the uterus, cervix, vagina, amniotic sac, bladder, kidney, or even the lungs (pneumonia) can cause labor to start. Other suspected causes of preterm labor include:   Urogenital infections, such as yeast infections and bacterial vaginosis.   Uterine abnormalities (uterine shape, uterine septum, fibroids, or bleeding from the placenta).   A cervix that has been operated on (it may fail to stay closed).   Malformations in the fetus.   Multiple gestations (twins, triplets, and so on).   Breakage of the amniotic sac.  RISK FACTORS  Having a previous history of preterm labor.   Having premature rupture of membranes (PROM).   Having a placenta that covers the opening of the cervix (placenta previa).   Having a placenta that separates from the uterus (placental abruption).   Having a cervix that is too weak to hold the fetus in the uterus (incompetent cervix).   Having too much fluid in the amniotic sac (polyhydramnios).   Taking illegal drugs or smoking while pregnant.   Not gaining enough  weight while pregnant.   Being younger than 52 and older than 33 years old.   Having a low socioeconomic status.   Being African American. SYMPTOMS Signs and symptoms of preterm labor include:   Menstrual-like cramps, abdominal pain, or back pain.  Uterine contractions that are regular, as frequent as six in an hour, regardless of their intensity (may be mild or painful).  Contractions that start on the top of the uterus and spread down to the lower abdomen and back.   A sense of increased pelvic pressure.   A watery or bloody mucus discharge that comes from the vagina.  TREATMENT Depending on the length of the pregnancy and other circumstances, your health care provider may suggest bed rest. If necessary, there are medicines that can be given to stop contractions and to mature the fetal lungs. If labor happens before 34 weeks of pregnancy, a prolonged hospital stay may be recommended. Treatment depends on the condition of both you and the fetus.  WHAT SHOULD YOU DO IF YOU THINK YOU ARE IN PRETERM LABOR? Call your health care provider right away. You will need to go to the hospital to get checked immediately. HOW CAN YOU PREVENT PRETERM LABOR IN FUTURE PREGNANCIES? You should:   Stop smoking if you smoke.  Maintain healthy weight gain and avoid chemicals and drugs that are not necessary.  Be watchful for any type of infection.  Inform your health care provider if you have a known history of preterm labor. Document Released: 08/25/2003 Document Revised: 02/04/2013 Document Reviewed:  07/07/2012 ExitCare Patient Information 2014 [place].  Fetal Movement Counts Patient Name: __________________________________________________ Patient Due Date: ____________________ Performing a fetal movement count is highly recommended in high-risk pregnancies, but it is good for every pregnant woman to do. Your caregiver may ask you to start counting fetal movements at 28 weeks of the  pregnancy. Fetal movements often increase:  After eating a full meal.  After physical activity.  After eating or drinking something sweet or cold.  At rest. Pay attention to when you feel the baby is most active. This will help you notice a pattern of your baby's sleep and wake cycles and what factors contribute to an increase in fetal movement. It is important to perform a fetal movement count at the same time each day when your baby is normally most active.  HOW TO COUNT FETAL MOVEMENTS 1. Find a quiet and comfortable area to sit or lie down on your left side. Lying on your left side provides the best blood and oxygen circulation to your baby. 2. Write down the day and time on a sheet of paper or in a journal. 3. Start counting kicks, flutters, swishes, rolls, or jabs in a 2 hour period. You should feel at least 10 movements within 2 hours. 4. If you do not feel 10 movements in 2 hours, wait 2 3 hours and count again. Look for a change in the pattern or not enough counts in 2 hours. SEEK MEDICAL CARE IF:  You feel less than 10 counts in 2 hours, tried twice.  There is no movement in over an hour.  The pattern is changing or taking longer each day to reach 10 counts in 2 hours.  You feel the baby is not moving as he or she usually does. Date: ____________ Movements: ____________ Start time: ____________ Doreatha MartinFinish time: ____________  Date: ____________ Movements: ____________ Start time: ____________ Doreatha MartinFinish time: ____________ Date: ____________ Movements: ____________ Start time: ____________ Doreatha MartinFinish time: ____________ Date: ____________ Movements: ____________ Start time: ____________ Doreatha MartinFinish time: ____________ Date: ____________ Movements: ____________ Start time: ____________ Doreatha MartinFinish time: ____________ Date: ____________ Movements: ____________ Start time: ____________ Doreatha MartinFinish time: ____________ Date: ____________ Movements: ____________ Start time: ____________ Doreatha MartinFinish time:  ____________ Date: ____________ Movements: ____________ Start time: ____________ Doreatha MartinFinish time: ____________  Date: ____________ Movements: ____________ Start time: ____________ Doreatha MartinFinish time: ____________ Date: ____________ Movements: ____________ Start time: ____________ Doreatha MartinFinish time: ____________ Date: ____________ Movements: ____________ Start time: ____________ Doreatha MartinFinish time: ____________ Date: ____________ Movements: ____________ Start time: ____________ Doreatha MartinFinish time: ____________ Date: ____________ Movements: ____________ Start time: ____________ Doreatha MartinFinish time: ____________ Date: ____________ Movements: ____________ Start time: ____________ Doreatha MartinFinish time: ____________ Date: ____________ Movements: ____________ Start time: ____________ Doreatha MartinFinish time: ____________  Date: ____________ Movements: ____________ Start time: ____________ Doreatha MartinFinish time: ____________ Date: ____________ Movements: ____________ Start time: ____________ Doreatha MartinFinish time: ____________ Date: ____________ Movements: ____________ Start time: ____________ Doreatha MartinFinish time: ____________ Date: ____________ Movements: ____________ Start time: ____________ Doreatha MartinFinish time: ____________ Date: ____________ Movements: ____________ Start time: ____________ Doreatha MartinFinish time: ____________ Date: ____________ Movements: ____________ Start time: ____________ Doreatha MartinFinish time: ____________ Date: ____________ Movements: ____________ Start time: ____________ Doreatha MartinFinish time: ____________  Date: ____________ Movements: ____________ Start time: ____________ Doreatha MartinFinish time: ____________ Date: ____________ Movements: ____________ Start time: ____________ Doreatha MartinFinish time: ____________ Date: ____________ Movements: ____________ Start time: ____________ Doreatha MartinFinish time: ____________ Date: ____________ Movements: ____________ Start time: ____________ Doreatha MartinFinish time: ____________ Date: ____________ Movements: ____________ Start time: ____________ Doreatha MartinFinish time: ____________ Date: ____________ Movements:  ____________ Start time: ____________ Doreatha MartinFinish time: ____________ Date: ____________ Movements: ____________ Start time: ____________ Doreatha MartinFinish time: ____________  Date: ____________ Movements: ____________ Start time: ____________ Doreatha Martin time: ____________ Date: ____________ Movements: ____________ Start time: ____________ Doreatha Martin time: ____________ Date: ____________ Movements: ____________ Start time: ____________ Doreatha Martin time: ____________ Date: ____________ Movements: ____________ Start time: ____________ Doreatha Martin time: ____________ Date: ____________ Movements: ____________ Start time: ____________ Doreatha Martin time: ____________ Date: ____________ Movements: ____________ Start time: ____________ Doreatha Martin time: ____________ Date: ____________ Movements: ____________ Start time: ____________ Doreatha Martin time: ____________  Date: ____________ Movements: ____________ Start time: ____________ Doreatha Martin time: ____________ Date: ____________ Movements: ____________ Start time: ____________ Doreatha Martin time: ____________ Date: ____________ Movements: ____________ Start time: ____________ Doreatha Martin time: ____________ Date: ____________ Movements: ____________ Start time: ____________ Doreatha Martin time: ____________ Date: ____________ Movements: ____________ Start time: ____________ Doreatha Martin time: ____________ Date: ____________ Movements: ____________ Start time: ____________ Doreatha Martin time: ____________ Date: ____________ Movements: ____________ Start time: ____________ Doreatha Martin time: ____________  Date: ____________ Movements: ____________ Start time: ____________ Doreatha Martin time: ____________ Date: ____________ Movements: ____________ Start time: ____________ Doreatha Martin time: ____________ Date: ____________ Movements: ____________ Start time: ____________ Doreatha Martin time: ____________ Date: ____________ Movements: ____________ Start time: ____________ Doreatha Martin time: ____________ Date: ____________ Movements: ____________ Start time: ____________ Doreatha Martin  time: ____________ Date: ____________ Movements: ____________ Start time: ____________ Doreatha Martin time: ____________ Date: ____________ Movements: ____________ Start time: ____________ Doreatha Martin time: ____________  Date: ____________ Movements: ____________ Start time: ____________ Doreatha Martin time: ____________ Date: ____________ Movements: ____________ Start time: ____________ Doreatha Martin time: ____________ Date: ____________ Movements: ____________ Start time: ____________ Doreatha Martin time: ____________ Date: ____________ Movements: ____________ Start time: ____________ Doreatha Martin time: ____________ Date: ____________ Movements: ____________ Start time: ____________ Doreatha Martin time: ____________ Date: ____________ Movements: ____________ Start time: ____________ Doreatha Martin time: ____________ Document Released: 07/04/2006 Document Revised: 05/21/2012 Document Reviewed: 03/31/2012 ExitCare Patient Information 2014 [employer].

## 2013-11-15 NOTE — MAU Provider Note (Signed)
Attestation of Attending Supervision of Advanced Practitioner (CNM/NP): Evaluation and management procedures were performed by the Advanced Practitioner under my supervision and collaboration.  I have reviewed the Advanced Practitioner's note and chart, and I agree with the management and plan.  Hannah Cook 11/15/2013 4:03 AM

## 2013-11-17 ENCOUNTER — Encounter: Payer: Self-pay | Admitting: Obstetrics and Gynecology

## 2013-11-17 ENCOUNTER — Ambulatory Visit (INDEPENDENT_AMBULATORY_CARE_PROVIDER_SITE_OTHER): Payer: Medicaid Other | Admitting: Obstetrics and Gynecology

## 2013-11-17 VITALS — BP 124/73 | HR 102 | Temp 98.2°F | Wt 219.7 lb

## 2013-11-17 DIAGNOSIS — O99350 Diseases of the nervous system complicating pregnancy, unspecified trimester: Secondary | ICD-10-CM

## 2013-11-17 DIAGNOSIS — O358XX0 Maternal care for other (suspected) fetal abnormality and damage, not applicable or unspecified: Secondary | ICD-10-CM

## 2013-11-17 DIAGNOSIS — O99333 Smoking (tobacco) complicating pregnancy, third trimester: Secondary | ICD-10-CM

## 2013-11-17 DIAGNOSIS — O359XX0 Maternal care for (suspected) fetal abnormality and damage, unspecified, not applicable or unspecified: Secondary | ICD-10-CM

## 2013-11-17 DIAGNOSIS — G40909 Epilepsy, unspecified, not intractable, without status epilepticus: Secondary | ICD-10-CM

## 2013-11-17 DIAGNOSIS — O09899 Supervision of other high risk pregnancies, unspecified trimester: Secondary | ICD-10-CM

## 2013-11-17 DIAGNOSIS — O99330 Smoking (tobacco) complicating pregnancy, unspecified trimester: Secondary | ICD-10-CM

## 2013-11-17 DIAGNOSIS — Z3480 Encounter for supervision of other normal pregnancy, unspecified trimester: Secondary | ICD-10-CM

## 2013-11-17 LAB — POCT URINALYSIS DIP (DEVICE)
Bilirubin Urine: NEGATIVE
Glucose, UA: NEGATIVE mg/dL
Hgb urine dipstick: NEGATIVE
KETONES UR: NEGATIVE mg/dL
Leukocytes, UA: NEGATIVE
Nitrite: NEGATIVE
PH: 7 (ref 5.0–8.0)
PROTEIN: 30 mg/dL — AB
Urobilinogen, UA: 1 mg/dL (ref 0.0–1.0)

## 2013-11-17 MED ORDER — PRENATAL VITAMINS 0.8 MG PO TABS: 1.0000 | ORAL_TABLET | Freq: Every day | ORAL | Status: DC

## 2013-11-17 NOTE — Progress Notes (Signed)
Patient is doing well with only complaint of bilateral lower extremity edema- advised to elevate feet as often as possible and to use compression stocking. FM/PTL precautions reviewed. Normal results of 5/21 fetal echo reviewed with the patient. Will contact medicaid office for patient, but there is no alternative to folic acid other than diet

## 2013-11-17 NOTE — Progress Notes (Signed)
Called patient to inform medication will not be not be covered; left message informing her of this and stated she may call clinic if she would like [place] to transfer prescription to [facility] but she could easily call [facility] and have prescription transferred herself; call clinic with any questions or concerns.

## 2013-11-17 NOTE — Progress Notes (Signed)
Edema in hands and feet-- reports it can be painful.  Patient states she was told by pharmacy that folic acid supplement is no longer covered by medicaid-- called patient's pharmacy who stated medicaid will not cover it and it is due to patient's age-- $12.99 out of pocket. Called [facility] and it would be $8 out of pocket.

## 2013-11-17 NOTE — Progress Notes (Signed)
Called Medicaid [phone] to inquire about patient's folic acid prescription. Spoke to pharmacist who informed me two weeks ago Medicaid cut most vitamins and will no longer cover them-- patient over the age of 61 and therefore unable to get them and

## 2013-11-17 NOTE — Progress Notes (Signed)
Medicaid pharmacist states there is no override and folic acid supplement will not be covered. Patient will have to pay out of pocket.

## 2013-11-17 NOTE — Addendum Note (Signed)
Addended by: Sherre Lain A on: 11/17/2013 11:45 AM   Modules accepted: Orders

## 2013-11-28 ENCOUNTER — Inpatient Hospital Stay (HOSPITAL_COMMUNITY)
Admission: AD | Admit: 2013-11-28 | Discharge: 2013-11-28 | Discharge status: Against Medical Advice | Payer: Medicaid Other | Source: Ambulatory Visit | Attending: Obstetrics & Gynecology | Admitting: Obstetrics & Gynecology

## 2013-11-28 ENCOUNTER — Encounter (HOSPITAL_COMMUNITY): Payer: Self-pay | Admitting: *Deleted

## 2013-11-28 DIAGNOSIS — O9989 Other specified diseases and conditions complicating pregnancy, childbirth and the puerperium: Principal | ICD-10-CM

## 2013-11-28 DIAGNOSIS — S1093XA Contusion of unspecified part of neck, initial encounter: Secondary | ICD-10-CM

## 2013-11-28 DIAGNOSIS — S0083XA Contusion of other part of head, initial encounter: Secondary | ICD-10-CM

## 2013-11-28 DIAGNOSIS — O99330 Smoking (tobacco) complicating pregnancy, unspecified trimester: Secondary | ICD-10-CM | POA: Insufficient documentation

## 2013-11-28 DIAGNOSIS — W010XXA Fall on same level from slipping, tripping and stumbling without subsequent striking against object, initial encounter: Secondary | ICD-10-CM | POA: Insufficient documentation

## 2013-11-28 DIAGNOSIS — IMO0002 Reserved for concepts with insufficient information to code with codable children: Secondary | ICD-10-CM

## 2013-11-28 DIAGNOSIS — O99891 Other specified diseases and conditions complicating pregnancy: Secondary | ICD-10-CM | POA: Insufficient documentation

## 2013-11-28 DIAGNOSIS — Y929 Unspecified place or not applicable: Secondary | ICD-10-CM | POA: Insufficient documentation

## 2013-11-28 DIAGNOSIS — S20219A Contusion of unspecified front wall of thorax, initial encounter: Secondary | ICD-10-CM

## 2013-11-28 DIAGNOSIS — S0003XA Contusion of scalp, initial encounter: Secondary | ICD-10-CM

## 2013-11-28 NOTE — MAU Note (Signed)
0205 Pt sat up in bed and recorded maternal HR

## 2013-11-28 NOTE — MAU Note (Signed)
Pt states she has to leave due to having to be at work at 0600. Philipp DeputyKim Shaw CNM notified. PT agreed to stay on EFM until provider came down

## 2013-11-28 NOTE — MAU Provider Note (Signed)
  History     CSN: [id number]  Arrival date and time: 11/28/13 0055   None     Chief Complaint  Patient presents with  . Fall   HPI Ms Manson PasseyBrown is a 33yo [id number] @ 35.6wks who presents for eval s/p falling going up a curb and landing on her right side/abd. States that her leg just 'gave way'.  She was able to get up afterwards and denies any injury. MAE x 4.  No vag bldg or leaking; feeling +FM. [facility] and has been remarkable for 1) seizure d/o / brain surgery (aneurysm)  2) smoker 3) asthma  OB History   Grav Para Term Preterm Abortions TAB SAB Ect Mult Living   3 2 2       2       Past Medical History  Diagnosis Date  . Asthma   . Aneurysm   . Panic attack   . 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 Brain surgery    . Tonsillectomy    . Aneurysm coiling      Family History  Problem Relation Age of Onset  . Thyroid disease Mother   . Aortic aneurysm Mother     heart    History  Substance Use Topics  . Smoking status: Current Every Day Smoker -- 1.00 packs/day    Types: Cigarettes  . Smokeless tobacco: Never Used  . Alcohol Use: No    Allergies:  Allergies  Allergen Reactions  . Hydroxacen [Hydroxyzine] Itching and Nausea And Vomiting  . Vicodin [Hydrocodone-Acetaminophen] Itching and Nausea And Vomiting    Headache.  Headache.     Prescriptions prior to admission  Medication Sig Dispense Refill  . lamoTRIgine (LAMICTAL) 150 MG tablet Take 150-300 mg by mouth 2 (two) times daily. 150mg  AM; 300mg  PM      . folic acid (FOLVITE) 1 MG tablet take 4 tablets by mouth once daily  60 tablet  3  . Prenatal Multivit-Min-Fe-FA (PRENATAL VITAMINS) 0.8 MG tablet Take 1 tablet by mouth daily.  30 tablet  12    ROS Physical Exam   Blood pressure 124/69, pulse 107, temperature 98.5 F (36.9 C), resp. rate 20, height 5' 5.5" (1.664 m), weight 98.703 kg (217 lb 9.6  oz), last menstrual period 03/22/2013.  Physical Exam  Constitutional: She is oriented to person, place, and time. She appears well-developed.  HENT:  Head: Normocephalic.  Cardiovascular:  Sl tachycardic  Respiratory: Effort normal.  GI:  EFM 120s, +accels, no decels (approx 50 mins) Irreg ctx  Genitourinary:  Refused cx exam  Musculoskeletal: Normal range of motion.  Neurological: She is alert and oriented to person, place, and time.  Skin: Skin is warm and dry.  Psychiatric: She has a normal mood and affect. Her behavior is normal. Thought content normal.    MAU Course  Procedures  Explained to pt that in the case of s/p fall with striking abd our protocol is to have the pt on EFM for at least 4 hours. Pt has to be at work at [employer]6am and says she cannot stay. She also declines a recommended cx exam.  Assessment and Plan  IUP at 35.6wks S/p fall  Left AMA Plans to keep scheduled clinic appt this coming week  SHAW, KIMBERLY CNM 11/28/2013, 2:30 AM

## 2013-11-28 NOTE — Progress Notes (Signed)
Philipp DeputyKim Shaw CNM in to see pt. Pt wants to leave. AMA paper signed by pt after risks and benefits reviewed. Pt left ambulatory with daughter

## 2013-11-28 NOTE — MAU Provider Note (Signed)
Attestation of Attending Supervision of Advanced Practitioner (PA/CNM/NP): Evaluation and management procedures were performed by the Advanced Practitioner under my supervision and collaboration.  I have reviewed the Advanced Practitioner's note and chart, and I agree with the management and plan.  Airam Heidecker, MD, FACOG Attending Obstetrician & Gynecologist Faculty Practice, [facility]  

## 2013-11-28 NOTE — Progress Notes (Signed)
Philipp DeputyKim Shaw CNM in to see pt

## 2013-11-28 NOTE — MAU Note (Signed)
I was steppig up onto curb and my leg gave out. I fell on my R side and hit the right side of my abdomen. Denies bleeding or leaking fld. Has felt FM. Fall happened about 2200

## 2013-11-28 NOTE — Progress Notes (Signed)
Philipp DeputyKim SHaw CNM notified of pt's admisson and status. Will see pt

## 2013-12-01 ENCOUNTER — Encounter: Payer: Medicaid Other | Admitting: Obstetrics and Gynecology

## 2013-12-01 ENCOUNTER — Encounter: Payer: Self-pay | Admitting: Obstetrics and Gynecology

## 2013-12-10 ENCOUNTER — Ambulatory Visit (INDEPENDENT_AMBULATORY_CARE_PROVIDER_SITE_OTHER): Payer: Medicaid Other | Admitting: Obstetrics and Gynecology

## 2013-12-10 ENCOUNTER — Encounter: Payer: Self-pay | Admitting: Obstetrics and Gynecology

## 2013-12-10 VITALS — BP 120/73 | HR 99 | Temp 97.1°F | Wt 219.6 lb

## 2013-12-10 DIAGNOSIS — O3090 Multiple gestation, unspecified, unspecified trimester: Secondary | ICD-10-CM

## 2013-12-10 DIAGNOSIS — O358XX0 Maternal care for other (suspected) fetal abnormality and damage, not applicable or unspecified: Secondary | ICD-10-CM

## 2013-12-10 DIAGNOSIS — O09899 Supervision of other high risk pregnancies, unspecified trimester: Secondary | ICD-10-CM

## 2013-12-10 DIAGNOSIS — O99333 Smoking (tobacco) complicating pregnancy, third trimester: Secondary | ICD-10-CM

## 2013-12-10 DIAGNOSIS — O99353 Diseases of the nervous system complicating pregnancy, third trimester: Secondary | ICD-10-CM

## 2013-12-10 DIAGNOSIS — Z3483 Encounter for supervision of other normal pregnancy, third trimester: Secondary | ICD-10-CM

## 2013-12-10 DIAGNOSIS — G40909 Epilepsy, unspecified, not intractable, without status epilepticus: Secondary | ICD-10-CM

## 2013-12-10 DIAGNOSIS — O99350 Diseases of the nervous system complicating pregnancy, unspecified trimester: Secondary | ICD-10-CM

## 2013-12-10 DIAGNOSIS — O99330 Smoking (tobacco) complicating pregnancy, unspecified trimester: Secondary | ICD-10-CM

## 2013-12-10 DIAGNOSIS — O359XX1 Maternal care for (suspected) fetal abnormality and damage, unspecified, fetus 1: Secondary | ICD-10-CM

## 2013-12-10 DIAGNOSIS — Z3480 Encounter for supervision of other normal pregnancy, unspecified trimester: Secondary | ICD-10-CM

## 2013-12-10 LAB — POCT URINALYSIS DIP (DEVICE)
Glucose, UA: NEGATIVE mg/dL
Hgb urine dipstick: NEGATIVE
Ketones, ur: NEGATIVE mg/dL
Leukocytes, UA: NEGATIVE
NITRITE: NEGATIVE
Protein, ur: 30 mg/dL — AB
SPECIFIC GRAVITY, URINE: 1.02 (ref 1.005–1.030)
Urobilinogen, UA: 1 mg/dL (ref 0.0–1.0)
pH: 6.5 (ref 5.0–8.0)

## 2013-12-10 NOTE — Progress Notes (Signed)
Patient is doing well without complaints. FM/labor precautions reviewed. Cultures collected 

## 2013-12-10 NOTE — Progress Notes (Signed)
Patient reports falling 2 weeks ago.

## 2013-12-11 LAB — GC/CHLAMYDIA PROBE AMP
CT Probe RNA: NEGATIVE
GC Probe RNA: NEGATIVE

## 2013-12-13 LAB — CULTURE, BETA STREP (GROUP B ONLY)

## 2013-12-14 ENCOUNTER — Encounter: Payer: Self-pay | Admitting: Obstetrics and Gynecology

## 2013-12-14 ENCOUNTER — Telehealth: Payer: Self-pay | Admitting: General Practice

## 2013-12-14 NOTE — Telephone Encounter (Signed)
Patient called and left message that she was checking in on her test results. Called patient, no answer- left message that we are trying to return your phone call, please call us back at the clinics

## 2013-12-14 NOTE — Telephone Encounter (Signed)
Patient called back to front office and I informed her of normal results. Patient verbalized understanding and had no other questions

## 2013-12-19 ENCOUNTER — Inpatient Hospital Stay (HOSPITAL_COMMUNITY)
Admission: AD | Admit: 2013-12-19 | Discharge: 2013-12-21 | DRG: 775 | Disposition: A | Discharge status: Home / Self Care | Payer: Medicaid Other | Source: Ambulatory Visit | Attending: Obstetrics & Gynecology | Admitting: Obstetrics & Gynecology

## 2013-12-19 ENCOUNTER — Encounter (HOSPITAL_COMMUNITY): Payer: Self-pay | Admitting: *Deleted

## 2013-12-19 ENCOUNTER — Encounter (HOSPITAL_COMMUNITY): Payer: Medicaid Other | Admitting: Anesthesiology

## 2013-12-19 ENCOUNTER — Inpatient Hospital Stay (HOSPITAL_COMMUNITY): Payer: Medicaid Other | Admitting: Anesthesiology

## 2013-12-19 DIAGNOSIS — IMO0001 Reserved for inherently not codable concepts without codable children: Secondary | ICD-10-CM

## 2013-12-19 DIAGNOSIS — F411 Generalized anxiety disorder: Secondary | ICD-10-CM | POA: Diagnosis present

## 2013-12-19 DIAGNOSIS — O479 False labor, unspecified: Secondary | ICD-10-CM | POA: Diagnosis present

## 2013-12-19 DIAGNOSIS — O99344 Other mental disorders complicating childbirth: Secondary | ICD-10-CM | POA: Diagnosis present

## 2013-12-19 DIAGNOSIS — Z87891 Personal history of nicotine dependence: Secondary | ICD-10-CM | POA: Diagnosis not present

## 2013-12-19 DIAGNOSIS — O411090 Infection of amniotic sac and membranes, unspecified, unspecified trimester, not applicable or unspecified: Secondary | ICD-10-CM | POA: Diagnosis not present

## 2013-12-19 DIAGNOSIS — O99354 Diseases of the nervous system complicating childbirth: Secondary | ICD-10-CM | POA: Diagnosis present

## 2013-12-19 DIAGNOSIS — O99334 Smoking (tobacco) complicating childbirth: Secondary | ICD-10-CM | POA: Diagnosis present

## 2013-12-19 DIAGNOSIS — G40909 Epilepsy, unspecified, not intractable, without status epilepticus: Secondary | ICD-10-CM | POA: Diagnosis present

## 2013-12-19 LAB — TYPE AND SCREEN
ABO/RH(D): O POS
ANTIBODY SCREEN: NEGATIVE

## 2013-12-19 LAB — CBC
HCT: 36.8 % (ref 36.0–46.0)
Hemoglobin: 12.8 g/dL (ref 12.0–15.0)
MCH: 32.6 pg (ref 26.0–34.0)
MCHC: 34.8 g/dL (ref 30.0–36.0)
MCV: 93.6 fL (ref 78.0–100.0)
Platelets: 222 10*3/uL (ref 150–400)
RBC: 3.93 MIL/uL (ref 3.87–5.11)
RDW: 14.1 % (ref 11.5–15.5)
WBC: 9.3 10*3/uL (ref 4.0–10.5)

## 2013-12-19 LAB — RPR

## 2013-12-19 MED ORDER — PHENYLEPHRINE 40 MCG/ML (10ML) SYRINGE FOR IV PUSH (FOR BLOOD PRESSURE SUPPORT)
PREFILLED_SYRINGE | INTRAVENOUS | Status: AC
  Filled 2013-12-19: qty 10

## 2013-12-19 MED ORDER — CITRIC ACID-SODIUM CITRATE 334-500 MG/5ML PO SOLN: 30.0000 mL | ORAL | Status: DC | PRN

## 2013-12-19 MED ORDER — DIPHENHYDRAMINE HCL 50 MG/ML IJ SOLN: 12.5000 mg | INTRAMUSCULAR | Status: DC | PRN

## 2013-12-19 MED ORDER — MORPHINE SULFATE 4 MG/ML IJ SOLN
4.0000 mg | Freq: Once | INTRAMUSCULAR | Status: AC
  Administered 2013-12-19: 4 mg via INTRAMUSCULAR
  Filled 2013-12-19: qty 1

## 2013-12-19 MED ORDER — LAMOTRIGINE 100 MG PO TABS
300.0000 mg | ORAL_TABLET | Freq: Every day | ORAL | Status: DC
  Administered 2013-12-19: 300 mg via ORAL
  Filled 2013-12-19: qty 3

## 2013-12-19 MED ORDER — OXYTOCIN 40 UNITS IN LACTATED RINGERS INFUSION - SIMPLE MED
1.0000 m[IU]/min | INTRAVENOUS | Status: DC
  Administered 2013-12-19: 2 m[IU]/min via INTRAVENOUS
  Filled 2013-12-19: qty 1000

## 2013-12-19 MED ORDER — LACTATED RINGERS IV SOLN
INTRAVENOUS | Status: DC
  Administered 2013-12-19 – 2013-12-20 (×3): via INTRAVENOUS

## 2013-12-19 MED ORDER — IBUPROFEN 600 MG PO TABS
600.0000 mg | ORAL_TABLET | Freq: Once | ORAL | Status: AC
  Administered 2013-12-19: 600 mg via ORAL
  Filled 2013-12-19: qty 1

## 2013-12-19 MED ORDER — FENTANYL 2.5 MCG/ML BUPIVACAINE 1/10 % EPIDURAL INFUSION (WH - ANES)
14.0000 mL/h | INTRAMUSCULAR | Status: DC | PRN
  Administered 2013-12-19 (×3): 14 mL/h via EPIDURAL
  Filled 2013-12-19: qty 125

## 2013-12-19 MED ORDER — FENTANYL 2.5 MCG/ML BUPIVACAINE 1/10 % EPIDURAL INFUSION (WH - ANES)
INTRAMUSCULAR | Status: AC
  Administered 2013-12-19: 14 mL/h via EPIDURAL
  Filled 2013-12-19: qty 125

## 2013-12-19 MED ORDER — OXYTOCIN BOLUS FROM INFUSION
500.0000 mL | INTRAVENOUS | Status: DC
  Administered 2013-12-20: 500 mL via INTRAVENOUS

## 2013-12-19 MED ORDER — TERBUTALINE SULFATE 1 MG/ML IJ SOLN: 0.2500 mg | Freq: Once | INTRAMUSCULAR | Status: AC | PRN

## 2013-12-19 MED ORDER — LACTATED RINGERS IV SOLN
500.0000 mL | Freq: Once | INTRAVENOUS | Status: AC
Start: 2013-12-19 — End: 2013-12-19
  Administered 2013-12-19: 500 mL via INTRAVENOUS

## 2013-12-19 MED ORDER — ONDANSETRON HCL 4 MG/2ML IJ SOLN: 4.0000 mg | Freq: Four times a day (QID) | INTRAMUSCULAR | Status: DC | PRN

## 2013-12-19 MED ORDER — LIDOCAINE HCL (PF) 1 % IJ SOLN
30.0000 mL | INTRAMUSCULAR | Status: AC | PRN
  Administered 2013-12-19 (×2): 5 mL via SUBCUTANEOUS

## 2013-12-19 MED ORDER — EPHEDRINE 5 MG/ML INJ
10.0000 mg | INTRAVENOUS | Status: DC | PRN
  Filled 2013-12-19: qty 2

## 2013-12-19 MED ORDER — PHENYLEPHRINE 40 MCG/ML (10ML) SYRINGE FOR IV PUSH (FOR BLOOD PRESSURE SUPPORT)
80.0000 ug | PREFILLED_SYRINGE | INTRAVENOUS | Status: DC | PRN
  Filled 2013-12-19: qty 2

## 2013-12-19 MED ORDER — NICOTINE 21 MG/24HR TD PT24
21.0000 mg | MEDICATED_PATCH | Freq: Every day | TRANSDERMAL | Status: DC
  Administered 2013-12-19: 21 mg via TRANSDERMAL
  Filled 2013-12-19 (×2): qty 1

## 2013-12-19 MED ORDER — LAMOTRIGINE 100 MG PO TABS
150.0000 mg | ORAL_TABLET | Freq: Every morning | ORAL | Status: DC
  Filled 2013-12-19: qty 1.5

## 2013-12-19 MED ORDER — OXYTOCIN 40 UNITS IN LACTATED RINGERS INFUSION - SIMPLE MED: 62.5000 mL/h | INTRAVENOUS | Status: DC

## 2013-12-19 MED ORDER — FENTANYL CITRATE 0.05 MG/ML IJ SOLN: 100.0000 ug | INTRAMUSCULAR | Status: DC | PRN

## 2013-12-19 MED ORDER — LACTATED RINGERS IV SOLN
500.0000 mL | INTRAVENOUS | Status: DC | PRN
  Administered 2013-12-20: 500 mL via INTRAVENOUS

## 2013-12-19 MED ORDER — MORPHINE SULFATE 4 MG/ML IJ SOLN: 4.0000 mg | Freq: Once | INTRAMUSCULAR | Status: DC

## 2013-12-19 MED ORDER — IBUPROFEN 600 MG PO TABS
600.0000 mg | ORAL_TABLET | Freq: Four times a day (QID) | ORAL | Status: DC | PRN
  Administered 2013-12-20: 600 mg via ORAL
  Filled 2013-12-19: qty 1

## 2013-12-19 NOTE — MAU Note (Signed)
U/S by Eure = vertex.

## 2013-12-19 NOTE — Progress Notes (Signed)
Hannah SchullerDevonda A Cook is a 33 y.o. G3P2002 at 8738w6d by LMP admitted for active labor  Subjective: In good spirits, feeling more pressure  Objective: BP 127/83  Pulse 91  Temp(Src) 97.7 F (36.5 C) (Oral)  Resp 18  Ht 5' 5.5" (1.664 m)  Wt 99.593 kg (219 lb 9 oz)  BMI 35.97 kg/m2  LMP 03/22/2013      FHT:  FHR: 115's bpm, variability: min,  accelerations:  Present,  decelerations:  early UC:   regular, every 2-3 minutes SVE:   Dilation: 7.5 Effacement (%): 90 Station: -2 Exam by:: Dr. Michail JewelsMarsh  Labs: Lab Results  Component Value Date   WBC 9.3 12/19/2013   HGB 12.8 12/19/2013   HCT 36.8 12/19/2013   MCV 93.6 12/19/2013   PLT 222 12/19/2013    Assessment / Plan: Spontaneous labor, progressing normally  Labor: Progressing normally s/p AROM, augmenting with pit, currently at 4 Preeclampsia:  no signs or symptoms of toxicity Fetal Wellbeing:  Category II Pain Control:  Epidural I/D:  n/a Anticipated MOD:  NSVD  Anselm LisMarsh, Jauna Raczynski 12/19/2013, 9:01 PM

## 2013-12-19 NOTE — MAU Note (Signed)
Patient presents with complaints of pinkish discharge X 3 hours and contractions X 1 hour.

## 2013-12-19 NOTE — Progress Notes (Signed)
I spoke with and examined patient and agree with resident/PA/SNM's note and plan of care.  Cheral MarkerKimberly R. Booker, CNM, [facility] 12/19/2013 10:25 PM

## 2013-12-19 NOTE — Anesthesia Procedure Notes (Signed)
Epidural Patient location during procedure: OB Start time: 12/19/2013 2:44 PM End time: 12/19/2013 3:02 PM  Staffing Anesthesiologist: Ishi Danser, CHRIS Performed by: anesthesiologist   Preanesthetic Checklist Completed: patient identified, surgical consent, pre-op evaluation, timeout performed, IV checked, risks and benefits discussed and monitors and equipment checked  Epidural Patient position: sitting Prep: site prepped and draped and DuraPrep Patient monitoring: heart rate, cardiac monitor, continuous pulse ox and blood pressure Approach: midline Location: L3-L4 Injection technique: LOR saline  Needle:  Needle type: Tuohy  Needle gauge: 17 G Needle length: 9 cm Needle insertion depth: 7 cm Catheter type: closed end flexible Catheter size: 19 Gauge Catheter at skin depth: 12 cm Test dose: Other  Assessment Events: blood not aspirated, injection not painful, no injection resistance, negative IV test and no paresthesia  Additional Notes H+P and labs checked, risks and benefits discussed with the patient, consent obtained, procedure tolerated well and without complications.  Reason for block:procedure for pain

## 2013-12-19 NOTE — Progress Notes (Signed)
I spoke with and examined patient and agree with resident/PA/SNM's note and plan of care.  Cheral MarkerKimberly R. Tyia Binford, CNM, [facility]NP-BC 12/19/2013 4:35 PM

## 2013-12-19 NOTE — Progress Notes (Signed)
Hannah Cook is a 33 y.o. G3P2002 at 6925w6d by LMP admitted for active labor  Subjective: Pt. Resting comfortably with epidural in place. S/P AROM. Repeat cervical exam at 5cm from 4cm. Pt. Comfortable. Will proceed with expectant management with augmentation as required.   Objective: BP 125/86  Pulse 80  Temp(Src) 98.2 F (36.8 C) (Oral)  Resp 20  Ht 5' 5.5" (1.664 m)  Wt 99.593 kg (219 lb 9 oz)  BMI 35.97 kg/m2  LMP 03/22/2013      FHT:  FHR: 140's bpm, variability: moderate,  accelerations:  Present,  decelerations:  Absent UC:   regular, every 12 minutes SVE:   Dilation: 5 Effacement (%): 100 Station: -1 Exam by:: Hannah EngK Haynes, RN  Labs: Lab Results  Component Value Date   WBC 9.3 12/19/2013   HGB 12.8 12/19/2013   HCT 36.8 12/19/2013   MCV 93.6 12/19/2013   PLT 222 12/19/2013    Assessment / Plan: Spontaneous labor, progressing normally  Labor: Progressing normally and s/p AROM will augment with pitocin if needed. Expectant managment  Preeclampsia:  no signs or symptoms of toxicity Fetal Wellbeing:  Category I Pain Control:  Epidural I/D:  n/a Anticipated MOD:  NSVD  Hannah Cook G 12/19/2013, 6:25 PM

## 2013-12-19 NOTE — MAU Note (Signed)
Per Wynona Caneshristine, RN charge, pt to go to room 162

## 2013-12-19 NOTE — Anesthesia Preprocedure Evaluation (Signed)
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, no HA or residual neurologic deficits at this time    GI/Hepatic negative GI ROS, Neg liver ROS,   Endo/Other  negative endocrine ROS  Renal/GU negative Renal ROS     Musculoskeletal   Abdominal   Peds  Hematology negative hematology ROS (+)   Anesthesia Other Findings   Reproductive/Obstetrics (+) Pregnancy                           Anesthesia Physical Anesthesia Plan  ASA: III  Anesthesia Plan: Epidural   Post-op Pain Management:    Induction:   Airway Management Planned:   Additional Equipment:   Intra-op Plan:   Post-operative Plan:   Informed Consent: I have reviewed the patients History and Physical, chart, labs and discussed the procedure including the risks, benefits and alternatives for the proposed anesthesia with the patient or authorized representative who has indicated his/her understanding and acceptance.   Dental advisory given  Plan Discussed with: Anesthesiologist  Anesthesia Plan Comments:         Anesthesia Quick Evaluation

## 2013-12-19 NOTE — Progress Notes (Signed)
Hannah SchullerDevonda A Cook is a 33 y.o. G3P2002 at 8074w6d by LMP admitted for active labor  Subjective: Pt. Resting comfortably with epidural in place. AROM at 4:15 pm. Plan to monitor and progress with labor to NSVD. No other complaints.   Objective: BP 119/76  Pulse 80  Temp(Src) 98.1 F (36.7 C) (Oral)  Resp 18  Ht 5' 5.5" (1.664 m)  Wt 99.593 kg (219 lb 9 oz)  BMI 35.97 kg/m2  LMP 03/22/2013      FHT:  FHR: 140's bpm, variability: moderate,  accelerations:  Present,  decelerations:  Absent UC:   regular, every 7 minutes SVE:   Dilation: 4 Effacement (%): 100 Station: -1 Exam by:: Dr. Don Perking. Hannah Cook  Labs: Lab Results  Component Value Date   WBC 9.3 12/19/2013   HGB 12.8 12/19/2013   HCT 36.8 12/19/2013   MCV 93.6 12/19/2013   PLT 222 12/19/2013    Assessment / Plan: Spontaneous labor, progressing normally  Labor: Progressing normally and s/p AROM will augment with pitocin if needed. Expectant managment  Preeclampsia:  no signs or symptoms of toxicity Fetal Wellbeing:  Category I Pain Control:  Epidural I/D:  n/a Anticipated MOD:  NSVD  Hannah Cook 12/19/2013, 4:27 PM

## 2013-12-19 NOTE — H&P (Signed)
Hannah SchullerDevonda A Cordaro is a 33 y.o. female G3P2002 with IUP at 4617w6d presenting for contractions. Pt states she has been having regular, every 6 minutes contractions, associated with scant staining vaginal bleeding.  Membranes are intact, with active fetal movement.   [facility]  Prenatal History/Complications: Pt. Is a 33 y.o. female G3P2002 with IUP at 3317w6d  With hx of smoking 1ppd during pregnancy, seizure disorder controlled on lamictal 150qam, 300qhs, and cerebral aneurysm clipped in 2002. She is presenting for contractions and bloody show starting this am around 7. She reports pressure and severe pain with contractions. She denies LOF. She has +FM. She denies nausea, vomiting, fever, or chills. She denies previous obstetrical complications.   Previous Pregnancies 1. 40w 6lb 7oz SVD 2. 40w 6lb 5oz SVD  Past Medical History: Past Medical History  Diagnosis Date  . Asthma   . Aneurysm   . Panic attack   . Seizures     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    Past Surgical History: Past Surgical History  Procedure Laterality Date  . Brain surgery    . Tonsillectomy    . Aneurysm coiling      Obstetrical History: OB History   Grav Para Term Preterm Abortions TAB SAB Ect Mult Living   3 2 2       2       Gynecological History: OB History   Grav Para Term Preterm Abortions TAB SAB Ect Mult Living   3 2 2       2       Social History: History   Social History  . Marital Status: Single    Spouse Name: N/A    Number of Children: N/A  . Years of Education: N/A   Social History Main Topics  . Smoking status: Current Every Day Smoker -- 1.00 packs/day    Types: Cigarettes  . Smokeless tobacco: Never Used  . Alcohol Use: No  . Drug Use: No  . Sexual Activity: Yes     Comment: pregnant   Other Topics Concern  . None   Social History Narrative  . None    Family History: Family History  Problem Relation Age of Onset  .  Thyroid disease Mother   . Aortic aneurysm Mother     heart    Allergies: Allergies  Allergen Reactions  . Hydroxacen [Hydroxyzine] Itching and Nausea And Vomiting  . Vicodin [Hydrocodone-Acetaminophen] Itching and Nausea And Vomiting    Headache.  Headache.     Prescriptions prior to admission  Medication Sig Dispense Refill  . lamoTRIgine (LAMICTAL) 150 MG tablet Take 150-300 mg by mouth 2 (two) times daily. Takes 150 mg in the morning and 300 mg in the evening         Review of Systems   Constitutional: See HPI above.   Blood pressure 118/73, pulse 100, temperature 98.1 F (36.7 C), temperature source Oral, resp. rate 20, height 5' 5.5" (1.664 m), weight 99.593 kg (219 lb 9 oz), last menstrual period 03/22/2013. General appearance: alert, cooperative and no distress Lungs: clear to auscultation bilaterally Heart: regular rate and rhythm Abdomen: soft, non-tender; bowel sounds normal Pelvic: 3.5cm, 100%, -3 per Joellyn HaffKim Loel Betancur CNM Extremities: Homans sign is negative, no sign of DVT Neurologically grossly intact Presentation: cephalic Fetal monitoringBaseline: 130's bpm, Variability: Good {> 6 bpm) and Accelerations: Reactive Uterine activityFrequency: Every 6 minutes and Duration: 30 seconds Dilation: 3.5 Effacement (%): 100 Station: -3 Exam  by:: kbooker, cnm   Prenatal labs: ABO, Rh: O/POS/-- (12/03 0951) Antibody: NEG (12/03 0951) Rubella:   RPR: NON REAC (04/09 1230)  HBsAg: NEGATIVE (12/03 0951)  HIV: NONREACTIVE (04/09 1230)  GBS: Negative (12/03 0000)    Genetic Screen Normal first screen  AFP not done  Anatomic US  WNL--recommend ECHO due to seizure d/o 08/27/2013 Possible VSD in fetus s/p ECHO 5/21- normal  Glucose Screen 84  GBS  negative  Feeding Preference Breast  Contraception Depo Provera  Circumcision Yes      Prenatal Transfer Tool  Maternal Diabetes: No  Genetic Screening: Normal Maternal Ultrasounds/Referrals: Normal Fetal Ultrasounds  or other Referrals:  Fetal echo 5/21 normal.  Maternal Substance Abuse:  Yes:  Type: Smoker Significant Maternal Medications:  Meds include: Other:  Lamictal  Significant Maternal Lab Results: None     No results found for this or any previous visit (from the past 24 hour(s)).  Assessment: Hannah Cook is a 33 y.o. G3P2002 at 1955w6d by L= LMP here for active labor #Labor: Plan for admission to L&D with augmentation with AROM / Pitocin if necessary and expectant management for NSVD #Pain: Fentanyl, Epidural #FWB: Cat II #ID:  GBS Negative. No ppx at this time.  #MOF: Breast #MOC: Undecided at this time.  #Circ:  Yes  Melancon, Caleb Reece AgarG 12/19/2013, 2:03 PM   I spoke with and examined patient and agree with resident/PA/SNM's note and plan of care.  Cheral MarkerKimberly R. Jassica Zazueta, CNM, [facility] 12/19/2013 3:22 PM

## 2013-12-20 ENCOUNTER — Encounter (HOSPITAL_COMMUNITY): Payer: Self-pay

## 2013-12-20 DIAGNOSIS — O99354 Diseases of the nervous system complicating childbirth: Secondary | ICD-10-CM

## 2013-12-20 DIAGNOSIS — O411090 Infection of amniotic sac and membranes, unspecified, unspecified trimester, not applicable or unspecified: Secondary | ICD-10-CM

## 2013-12-20 DIAGNOSIS — G40909 Epilepsy, unspecified, not intractable, without status epilepticus: Secondary | ICD-10-CM

## 2013-12-20 LAB — CORD BLOOD GAS (ARTERIAL)

## 2013-12-20 LAB — ABO/RH: ABO/RH(D): O POS

## 2013-12-20 MED ORDER — LANOLIN HYDROUS EX OINT: TOPICAL_OINTMENT | CUTANEOUS | Status: DC | PRN

## 2013-12-20 MED ORDER — LAMOTRIGINE 150 MG PO TABS
300.0000 mg | ORAL_TABLET | Freq: Every day | ORAL | Status: DC
  Administered 2013-12-20: 300 mg via ORAL
  Filled 2013-12-20: qty 2

## 2013-12-20 MED ORDER — IBUPROFEN 600 MG PO TABS
600.0000 mg | ORAL_TABLET | Freq: Four times a day (QID) | ORAL | Status: DC
  Administered 2013-12-20 – 2013-12-21 (×5): 600 mg via ORAL
  Filled 2013-12-20 (×5): qty 1

## 2013-12-20 MED ORDER — WITCH HAZEL-GLYCERIN EX PADS: 1.0000 "application " | MEDICATED_PAD | CUTANEOUS | Status: DC | PRN

## 2013-12-20 MED ORDER — CYCLOBENZAPRINE HCL 10 MG PO TABS
10.0000 mg | ORAL_TABLET | Freq: Once | ORAL | Status: AC
  Filled 2013-12-20: qty 1

## 2013-12-20 MED ORDER — PRENATAL MULTIVITAMIN CH
1.0000 | ORAL_TABLET | Freq: Every day | ORAL | Status: DC
  Administered 2013-12-20 – 2013-12-21 (×2): 1 via ORAL
  Filled 2013-12-20 (×2): qty 1

## 2013-12-20 MED ORDER — LAMOTRIGINE 150 MG PO TABS
150.0000 mg | ORAL_TABLET | Freq: Every day | ORAL | Status: DC
  Administered 2013-12-20 – 2013-12-21 (×2): 150 mg via ORAL
  Filled 2013-12-20 (×2): qty 1

## 2013-12-20 MED ORDER — SIMETHICONE 80 MG PO CHEW: 80.0000 mg | CHEWABLE_TABLET | ORAL | Status: DC | PRN

## 2013-12-20 MED ORDER — TETANUS-DIPHTH-ACELL PERTUSSIS 5-2.5-18.5 LF-MCG/0.5 IM SUSP: 0.5000 mL | Freq: Once | INTRAMUSCULAR | Status: DC

## 2013-12-20 MED ORDER — ONDANSETRON HCL 4 MG/2ML IJ SOLN: 4.0000 mg | INTRAMUSCULAR | Status: DC | PRN

## 2013-12-20 MED ORDER — ONDANSETRON HCL 4 MG PO TABS: 4.0000 mg | ORAL_TABLET | ORAL | Status: DC | PRN

## 2013-12-20 MED ORDER — BENZOCAINE-MENTHOL 20-0.5 % EX AERO: 1.0000 "application " | INHALATION_SPRAY | CUTANEOUS | Status: DC | PRN

## 2013-12-20 MED ORDER — SENNOSIDES-DOCUSATE SODIUM 8.6-50 MG PO TABS
2.0000 | ORAL_TABLET | ORAL | Status: DC
  Administered 2013-12-21: 2 via ORAL
  Filled 2013-12-20: qty 2

## 2013-12-20 MED ORDER — ZOLPIDEM TARTRATE 5 MG PO TABS: 5.0000 mg | ORAL_TABLET | Freq: Every evening | ORAL | Status: DC | PRN

## 2013-12-20 MED ORDER — DIPHENHYDRAMINE HCL 25 MG PO CAPS: 25.0000 mg | ORAL_CAPSULE | Freq: Four times a day (QID) | ORAL | Status: DC | PRN

## 2013-12-20 MED ORDER — DIBUCAINE 1 % RE OINT: 1.0000 "application " | TOPICAL_OINTMENT | RECTAL | Status: DC | PRN

## 2013-12-20 MED ORDER — CYCLOBENZAPRINE HCL 5 MG PO TABS
5.0000 mg | ORAL_TABLET | Freq: Three times a day (TID) | ORAL | Status: DC | PRN
  Administered 2013-12-20: 5 mg via ORAL
  Filled 2013-12-20: qty 2

## 2013-12-20 MED ORDER — OXYCODONE-ACETAMINOPHEN 5-325 MG PO TABS
1.0000 | ORAL_TABLET | ORAL | Status: DC | PRN
  Administered 2013-12-20: 2 via ORAL
  Administered 2013-12-20: 1 via ORAL
  Administered 2013-12-20 – 2013-12-21 (×2): 2 via ORAL
  Filled 2013-12-20 (×2): qty 2
  Filled 2013-12-20: qty 1
  Filled 2013-12-20: qty 2

## 2013-12-20 MED ORDER — LIDOCAINE HCL (PF) 1 % IJ SOLN
30.0000 mL | Freq: Once | INTRAMUSCULAR | Status: DC
  Filled 2013-12-20: qty 30

## 2013-12-20 MED ORDER — CYCLOBENZAPRINE HCL 10 MG PO TABS
10.0000 mg | ORAL_TABLET | Freq: Three times a day (TID) | ORAL | Status: DC | PRN
  Filled 2013-12-20: qty 1

## 2013-12-20 NOTE — Plan of Care (Signed)
Problem: Phase II Progression Outcomes Goal: Skin to skin contact initiated Outcome: Not Met (add Reason) Baby skin to skin with FOB, mother does not want skin to skin with baby. Goal: Initiate breastfeeding within 1hr delivery Outcome: Not Met (add Reason) Mother does not want to breast feed now.

## 2013-12-20 NOTE — Progress Notes (Signed)
Pt does not want skin to skin with baby. FOB is skin to skin with baby. Mother does not want to breast feed at now stating she is going to sleep and will breast feed later.

## 2013-12-20 NOTE — Progress Notes (Signed)
Delivery of live female by Dr. Michail JewelsMarsh at [phone].

## 2013-12-20 NOTE — Progress Notes (Signed)
Yelling and arguing heard coming from patients room. Security called and FOB escorted out of hospital according to mother's request.

## 2013-12-20 NOTE — Progress Notes (Signed)
Hannah SchullerDevonda A Cook is a 33 y.o. G3P2002 at 6100w6d by LMP admitted for active labor  Subjective: Wants to start pushing, feeling more pressure Repositioned on side, o2 placed on board  Objective: BP 112/64  Pulse 116  Temp(Src) 97.7 F (36.5 C) (Oral)  Resp 18  Ht 5' 5.5" (1.664 m)  Wt 99.593 kg (219 lb 9 oz)  BMI 35.97 kg/m2  SpO2 96%  LMP 03/22/2013 I/O last 3 completed shifts: In: 664.6 [I.V.:664.6] Out: -  Total I/O In: 530.9 [I.V.:530.9] Out: -   FHT:  FHR: 150s's bpm, variability: min,  accelerations:  Present,  decelerations:  lates UC:   regular, every 2-3 minutes SVE:   Dilation: 10 Effacement (%): 90 Station: +1;+2 Exam by:: L.Stubbs, RN  Labs: Lab Results  Component Value Date   WBC 9.3 12/19/2013   HGB 12.8 12/19/2013   HCT 36.8 12/19/2013   MCV 93.6 12/19/2013   PLT 222 12/19/2013    Assessment / Plan: Spontaneous labor, progressing normally, now complete  Labor: allow to labor down  Preeclampsia:  no signs or symptoms of toxicity Fetal Wellbeing:  Category II, giving o2 and fluid bolus, will cont to monitor closely Pain Control:  Epidural I/D:  n/a Anticipated MOD:  NSVD  Anselm LisMarsh, [place] Winslow 12/20/2013, 12:35 AM

## 2013-12-20 NOTE — Anesthesia Postprocedure Evaluation (Signed)
  Anesthesia Post-op Note  Patient: Hannah Cook  Procedure(s) Performed: * No procedures listed *  Patient Location: Mother/Baby  Anesthesia Type:Epidural  Level of Consciousness: awake  Airway and Oxygen Therapy: Patient Spontanous Breathing  Post-op Pain: mild  Post-op Assessment: Post-op Vital signs reviewed, No signs of Nausea or vomiting, Pain level controlled, No headache and No residual numbness  Post-op Vital Signs: Reviewed  Last Vitals:  Filed Vitals:   12/20/13 0806  BP: 107/68  Pulse: 99  Temp: 36.9 C  Resp: 20    Complications: No apparent anesthesia complications

## 2013-12-21 ENCOUNTER — Encounter: Payer: Medicaid Other | Admitting: Family Medicine

## 2013-12-21 MED ORDER — NICOTINE 21 MG/24HR TD PT24
21.0000 mg | MEDICATED_PATCH | Freq: Every day | TRANSDERMAL | Status: DC
  Administered 2013-12-21: 21 mg via TRANSDERMAL
  Filled 2013-12-21: qty 1

## 2013-12-21 MED ORDER — PRENATAL MULTIVITAMIN CH: 1.0000 | ORAL_TABLET | Freq: Every day | ORAL | Status: DC

## 2013-12-21 MED ORDER — IBUPROFEN 600 MG PO TABS: 600.0000 mg | ORAL_TABLET | Freq: Four times a day (QID) | ORAL | Status: DC

## 2013-12-21 NOTE — Discharge Instructions (Signed)
Vaginal Delivery °Care After °Refer to this sheet in the next few weeks. These discharge instructions provide you with information on caring for yourself after delivery. Your caregiver may also give you specific instructions. Your treatment has been planned according to the most current medical practices available, but problems sometimes occur. Call your caregiver if you have any problems or questions after you go home. °HOME CARE INSTRUCTIONS °· Take over-the-counter or prescription medicines only as directed by your caregiver or pharmacist. °· Do not drink alcohol, especially if you are breastfeeding or taking medicine to relieve pain. °· Do not chew or smoke tobacco. °· Do not use illegal drugs. °· Continue to use good perineal care. Good perineal care includes: °¨ Wiping your perineum from front to back. °¨ Keeping your perineum clean. °· Do not use tampons or douche until your caregiver says it is okay. °· Shower, wash your hair, and take tub baths as directed by your caregiver. °· Wear a well-fitting bra that provides breast support. °· Eat healthy foods. °· Drink enough fluids to keep your urine clear or pale yellow. °· Eat high-fiber foods such as whole grain cereals and breads, Antos rice, beans, and fresh fruits and vegetables every day. These foods may help prevent or relieve constipation. °· Follow your cargiver's recommendations regarding resumption of activities such as climbing stairs, driving, lifting, exercising, or traveling. °· Talk to your caregiver about resuming sexual activities. Resumption of sexual activities is dependent upon your risk of infection, your rate of healing, and your comfort and desire to resume sexual activity. °· Try to have someone help you with your household activities and your newborn for at least a few days after you leave the hospital. °· Rest as much as possible. Try to rest or take a nap when your newborn is sleeping. °· Increase your activities gradually. °· Keep all  of your scheduled postpartum appointments. It is very important to keep your scheduled follow-up appointments. At these appointments, your caregiver will be checking to make sure that you are healing physically and emotionally. °SEEK MEDICAL CARE IF:  °· You are passing large clots from your vagina. Save any clots to show your caregiver. °· You have a foul smelling discharge from your vagina. °· You have trouble urinating. °· You are urinating frequently. °· You have pain when you urinate. °· You have a change in your bowel movements. °· You have increasing redness, pain, or swelling near your vaginal incision (episiotomy) or vaginal tear. °· You have pus draining from your episiotomy or vaginal tear. °· Your episiotomy or vaginal tear is separating. °· You have painful, hard, or reddened breasts. °· You have a severe headache. °· You have blurred vision or see spots. °· You feel sad or depressed. °· You have thoughts of hurting yourself or your newborn. °· You have questions about your care, the care of your newborn, or medicines. °· You are dizzy or lightheaded. °· You have a rash. °· You have nausea or vomiting. °· You were breastfeeding and have not had a menstrual period within 12 weeks after you stopped breastfeeding. °· You are not breastfeeding and have not had a menstrual period by the 12th week after delivery. °· You have a fever. °SEEK IMMEDIATE MEDICAL CARE IF:  °· You have persistent pain. °· You have chest pain. °· You have shortness of breath. °· You faint. °· You have leg pain. °· You have stomach pain. °· Your vaginal bleeding saturates two or more sanitary pads   in 1 hour. MAKE SURE YOU:   Understand these instructions.  Will watch your condition.  Will get help right away if you are not doing well or get worse. Document Released: 06/01/2000 Document Revised: 02/27/2012 Document Reviewed: 01/30/2012 [facility] Patient Information 2015 [place], [place]. This information is not intended to  replace advice given to you by your health care provider. Make sure you discuss any questions you have with your health care provider.   [facility]  [phone]. Call to make an appointment.

## 2013-12-21 NOTE — Progress Notes (Signed)
Ur chart review completed.  

## 2013-12-21 NOTE — Lactation Note (Signed)
This note was copied from the chart of Hannah Cook. Lactation Consultation Note Called into rm. States she would like to BF. Had been formula feeding bottles. Has DEBP in rm and was pumping. Stated "I don't have anything coming out". I explained, the breast was being stimulated, and sometimes able to get something out w/hand expression. Demonstrated. Happy to see colostrum. Plan to Breast and bottle feeding. Demonstrated hand expression, and DEBP set up and cleaning. Discussed formula and breast feeding. Baby latched well w/positioning. Patient Name: Hannah Audrie GallusDevonda Krouse [id number]'KToday's Date: 12/21/2013 Reason for consult: Follow-up assessment   Maternal Data    Feeding Feeding Type: Bottle Fed - Formula Length of feed: 20 min  LATCH Score/Interventions Latch: Grasps breast easily, tongue down, lips flanged, rhythmical sucking.  Audible Swallowing: Spontaneous and intermittent  Type of Nipple: Everted at rest and after stimulation  Comfort (Breast/Nipple): Soft / non-tender     Hold (Positioning): Assistance needed to correctly position infant at breast and maintain latch.  LATCH Score: 9  Lactation Tools Discussed/Used     Consult Status Consult Status: Follow-up Date: 12/21/13 Follow-up type: In-patient    Charyl DancerCARVER, Harbert Fitterer G 12/21/2013, 6:41 AM

## 2013-12-21 NOTE — Discharge Summary (Signed)
Obstetric Discharge Summary Reason for Admission: onset of labor Prenatal Procedures: NST Intrapartum Procedures: spontaneous vaginal delivery Postpartum Procedures: none Complications-Operative and Postpartum: none Baby requiring aggressive stimulation and suction.  Hemoglobin  Date Value Ref Range Status  12/19/2013 12.8  12.0 - 15.0 g/dL Final     HCT  Date Value Ref Range Status  12/19/2013 36.8  36.0 - 46.0 % Final   Hospital Course:  Pt. Is a 33 y/o G3P3003 at 39w presenting for SOL. She was admitted to L&D and had epidural placed. She progressed to NSVD without incident. No intrapartum complications. Baby required vigorous stimulation, and resuscitation. NICU called. She had no postpartum complications, and is experiencing only minimal pain / bleeding. She is tolerating po, ambulating, and passing flatus. She is stable and ready for discharge.   Delivery Note  At 2:01 AM a viable female was delivered via Vaginal, Spontaneous Delivery (Presentation: Right Occiput Anterior). APGAR: 5, 8; weight pending  Placenta status: Intact, Spontaneous. To path Cord: with the following complications: None. Cord pH: sent (venous)  Anesthesia: Epidural  Episiotomy: n/a  Lacerations: n/a  Suture Repair: n/a  Est. Blood Loss (mL): 250cc  Mom to postpartum. Baby to Nursery.  Pt pushed with good maternal effort and delivered a liveborn female via NSVD with over intact perineum. Baby requiring aggressive stim and suction before cry. Cord clamped and cut by FOB. NICU called, taken to radiant warmer for recus. Placenta delivered intact with 3V cord via traction and pitocin. Temp noted to be 99.6 at end of delivery. No further complications. Mom and baby to postpartum  Anselm LisMarsh, Melanie  12/20/2013, 2:26 AM     Physical Exam:  General: alert, cooperative and no distress Lochia: appropriate Uterine Fundus: firm Incision: N/A DVT Evaluation: No evidence of DVT seen on physical exam. No cords or calf  tenderness.  Discharge Diagnoses: Term Pregnancy-delivered  Discharge Information: Date: 12/21/2013 Activity: unrestricted and pelvic rest Diet: routine Medications: PNV and Ibuprofen Condition: stable Instructions: refer to practice specific booklet Discharge to: home Follow-up Information   [facility]. [facility]    Specialty:  Obstetrics and Gynecology   Contact information:   [address] [place] [address] [phone]      Newborn Data: Live born female  Birth Weight: 7 lb 8.8 oz (3425 g) APGAR: 5, 8  Home with mother. Desires Depo shot for contraception with bridge to long term contraception.   Melancon, Caleb G 12/21/2013, 8:04 AM  I have seen and examined this patient and I agree with the above. Cam HaiSHAW, Olayinka Gathers CNM 9:23 AM 12/23/2013

## 2013-12-23 NOTE — Discharge Summary (Signed)
Attestation of Attending Supervision of Advanced Practitioner (CNM/NP): Evaluation and management procedures were performed by the Advanced Practitioner under my supervision and collaboration.  I have reviewed the Advanced Practitioner's note and chart, and I agree with the management and plan.  Orby Tangen 12/23/2013 9:24 AM

## 2013-12-24 ENCOUNTER — Ambulatory Visit (INDEPENDENT_AMBULATORY_CARE_PROVIDER_SITE_OTHER): Payer: Medicaid Other | Admitting: Obstetrics and Gynecology

## 2013-12-24 VITALS — BP 117/75 | HR 66 | Temp 98.2°F | Ht 63.0 in | Wt 221.4 lb

## 2013-12-24 DIAGNOSIS — O9279 Other disorders of lactation: Secondary | ICD-10-CM

## 2013-12-24 NOTE — Progress Notes (Signed)
Patient ID: Hannah Cook, female   DOB: 10/07/1980, 33 y.o.   MRN: [id number] 33 yo G3P3 PPD#4 s/p SVD presenting today for evaluation of breast pain. Patient reports onset of pain yesterday. The pain is bilateral involving both breast. She is breast feeding every 3 hours  GENERAL: Well-developed, well-nourished female in no acute distress.  BREASTS: Symmetric in size. No palpable masses or lymphadenopathy, or skin changes. There is no erythema or induration.fluctuance. Expressible milky discharge.  ABDOMEN: Soft, nontender, nondistended. No organomegaly. EXTREMITIES: No cyanosis, clubbing, or edema, 2+ distal pulses.  A/P 33 yo PPD#4 s/p SVD with breast engorgement - Encouraged patient to continue frequent feeding - Encouraged the patient to completely empty breast following feeds with breast pump - RTC in 4 weeks for postpartum visit

## 2013-12-24 NOTE — Progress Notes (Signed)
Patient states the pain in her breast started yesterday, feels like she has had a fever as well. Denies warmth, redness, or rash.  States she is regularly emptying her breast.

## 2013-12-25 ENCOUNTER — Ambulatory Visit: Payer: Medicaid Other

## 2013-12-28 ENCOUNTER — Ambulatory Visit: Payer: Medicaid Other

## 2013-12-30 ENCOUNTER — Encounter (HOSPITAL_COMMUNITY): Payer: Self-pay

## 2013-12-30 ENCOUNTER — Inpatient Hospital Stay (HOSPITAL_COMMUNITY)
Admission: AD | Admit: 2013-12-30 | Discharge: 2013-12-31 | Disposition: A | Discharge status: Home / Self Care | Payer: Medicaid Other | Source: Ambulatory Visit | Attending: Obstetrics & Gynecology | Admitting: Obstetrics & Gynecology

## 2013-12-30 DIAGNOSIS — J329 Chronic sinusitis, unspecified: Secondary | ICD-10-CM | POA: Diagnosis not present

## 2013-12-30 DIAGNOSIS — R030 Elevated blood-pressure reading, without diagnosis of hypertension: Secondary | ICD-10-CM | POA: Diagnosis not present

## 2013-12-30 DIAGNOSIS — IMO0001 Reserved for inherently not codable concepts without codable children: Secondary | ICD-10-CM

## 2013-12-30 DIAGNOSIS — O99893 Other specified diseases and conditions complicating puerperium: Secondary | ICD-10-CM | POA: Diagnosis not present

## 2013-12-30 DIAGNOSIS — O9989 Other specified diseases and conditions complicating pregnancy, childbirth and the puerperium: Secondary | ICD-10-CM

## 2013-12-30 LAB — CBC WITH DIFFERENTIAL/PLATELET
Basophils Absolute: 0 10*3/uL (ref 0.0–0.1)
Basophils Relative: 0 % (ref 0–1)
EOS PCT: 0 % (ref 0–5)
Eosinophils Absolute: 0 10*3/uL (ref 0.0–0.7)
HCT: 31.7 % — ABNORMAL LOW (ref 36.0–46.0)
Hemoglobin: 10.9 g/dL — ABNORMAL LOW (ref 12.0–15.0)
LYMPHS ABS: 1.7 10*3/uL (ref 0.7–4.0)
Lymphocytes Relative: 15 % (ref 12–46)
MCH: 32.5 pg (ref 26.0–34.0)
MCHC: 34.4 g/dL (ref 30.0–36.0)
MCV: 94.6 fL (ref 78.0–100.0)
Monocytes Absolute: 0.4 10*3/uL (ref 0.1–1.0)
Monocytes Relative: 4 % (ref 3–12)
NEUTROS PCT: 81 % — AB (ref 43–77)
Neutro Abs: 8.7 10*3/uL — ABNORMAL HIGH (ref 1.7–7.7)
Platelets: 295 10*3/uL (ref 150–400)
RBC: 3.35 MIL/uL — AB (ref 3.87–5.11)
RDW: 13.9 % (ref 11.5–15.5)
WBC: 10.8 10*3/uL — ABNORMAL HIGH (ref 4.0–10.5)

## 2013-12-30 MED ORDER — IBUPROFEN 600 MG PO TABS
600.0000 mg | ORAL_TABLET | Freq: Once | ORAL | Status: AC
  Administered 2013-12-30: 600 mg via ORAL
  Filled 2013-12-30: qty 1

## 2013-12-30 NOTE — MAU Note (Signed)
Delivered 7/5, vaginal delivery.  Started having heavy bleeding about an hour ago, felt like water coming out. Reports passing big clots.  Got here and states in bathroom there was no more bleeding. Also,  5 days ago had sore throat, cold chills, and migraines- still going on.

## 2013-12-30 NOTE — MAU Provider Note (Signed)
History     CSN: [id number]  Arrival date and time: 12/30/13 2153   First Provider Initiated Contact with Patient 12/30/13 2226      Chief Complaint  Patient presents with  . Vaginal Bleeding   HPI Ms. Hannah Cook is a 33 y.o. [id number] who is PPD #11 after NSVD who presents to MAU today with complaint of heavy vaginal bleeding. The patient states that bleeding had been light and then today noted heavy bleeding with clots and lower abdominal cramping. The patient states minimal bleeding noted upon admission to MAU. The patient states she has also had headache, chills, fever, sore throat and body aches. She has had breast tenderness, but denies rash or redness. She denies vaginal discharge other than bleeding. She denies issues with HTN in pregnancy. She denies blurred vision, but does have headache. She also endorses bilateral LE edema.   OB History   Grav Para Term Preterm Abortions TAB SAB Ect Mult Living   3 3 3       3       Past Medical History  Diagnosis Date  . Asthma   . Aneurysm   . Panic attack   . 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 Brain surgery    . Tonsillectomy    . Aneurysm coiling      Family History  Problem Relation Age of Onset  . Thyroid disease Mother   . Aortic aneurysm Mother     heart    History  Substance Use Topics  . Smoking status: Light Tobacco Smoker -- 1.00 packs/day    Types: Cigarettes  . Smokeless tobacco: Never Used  . Alcohol Use: No    Allergies:  Allergies  Allergen Reactions  . Hydroxacen [Hydroxyzine] Itching and Nausea And Vomiting  . Vicodin [Hydrocodone-Acetaminophen] Itching, Nausea And Vomiting and Other (See Comments)    Headache.    No prescriptions prior to admission    Review of Systems  Constitutional: Positive for fever and chills. Negative for malaise/fatigue.  Cardiovascular: Positive for leg swelling.   Gastrointestinal: Positive for abdominal pain. Negative for nausea, vomiting, diarrhea and constipation.  Genitourinary: Negative for dysuria, urgency and frequency.       + vaginal bleeding Neg - vaginal discharge  Neurological: Positive for headaches.   Physical Exam   Blood pressure 147/80, pulse 87, temperature 100.4 F (38 C), temperature source Oral, resp. rate 18, last menstrual period 03/22/2013, SpO2 99.00%, currently breastfeeding.  Physical Exam  Constitutional: She is oriented to person, place, and time. She appears well-developed and well-nourished. No distress.  HENT:  Head: Normocephalic and atraumatic.  Nose: Mucosal edema and rhinorrhea present. Right sinus exhibits maxillary sinus tenderness and frontal sinus tenderness. Left sinus exhibits maxillary sinus tenderness and frontal sinus tenderness.  Mouth/Throat: Uvula is midline and mucous membranes are normal. Posterior oropharyngeal erythema present. No oropharyngeal exudate or posterior oropharyngeal edema.  Cardiovascular: Normal rate.   Respiratory: Effort normal.  GI: Soft. She exhibits no distension and no mass. There is tenderness (mild tenderness to palpation of the lower abdomen). There is no rebound and no guarding.  Genitourinary: There is breast swelling and tenderness.  No erythema, rash, heat of the bilateral breast  Musculoskeletal: She exhibits edema (2+ pitting edema).  Lymphadenopathy:       Head (right side): No submental, no submandibular and no tonsillar adenopathy present.  Head (left side): No submental, no submandibular and no tonsillar adenopathy present.    She has no cervical adenopathy.  Neurological: She is alert and oriented to person, place, and time. She has normal reflexes.  No clonus  Skin: Skin is warm and dry. No erythema.  Psychiatric: She has a normal mood and affect.   Results for orders placed during the hospital encounter of 12/30/13 (from the past 24 hour(s))  CBC  WITH DIFFERENTIAL     Status: Abnormal   Collection Time    12/30/13 11:25 PM      Result Value Ref Range   WBC 10.8 (*) 4.0 - 10.5 K/uL   RBC 3.35 (*) 3.87 - 5.11 MIL/uL   Hemoglobin 10.9 (*) 12.0 - 15.0 g/dL   HCT 16.1 (*) 09.6 - 04.5 %   MCV 94.6  78.0 - 100.0 fL   MCH 32.5  26.0 - 34.0 pg   MCHC 34.4  30.0 - 36.0 g/dL   RDW 40.9  81.1 - 91.4 %   Platelets 295  150 - 400 K/uL   Neutrophils Relative % 81 (*) 43 - 77 %   Neutro Abs 8.7 (*) 1.7 - 7.7 K/uL   Lymphocytes Relative 15  12 - 46 %   Lymphs Abs 1.7  0.7 - 4.0 K/uL   Monocytes Relative 4  3 - 12 %   Monocytes Absolute 0.4  0.1 - 1.0 K/uL   Eosinophils Relative 0  0 - 5 %   Eosinophils Absolute 0.0  0.0 - 0.7 K/uL   Basophils Relative 0  0 - 1 %   Basophils Absolute 0.0  0.0 - 0.1 K/uL  URINALYSIS, ROUTINE W REFLEX MICROSCOPIC     Status: Abnormal   Collection Time    12/30/13 11:35 PM      Result Value Ref Range   Color, Urine YELLOW  YELLOW   APPearance CLEAR  CLEAR   Specific Gravity, Urine 1.010  1.005 - 1.030   pH 6.0  5.0 - 8.0   Glucose, UA NEGATIVE  NEGATIVE mg/dL   Hgb urine dipstick SMALL (*) NEGATIVE   Bilirubin Urine NEGATIVE  NEGATIVE   Ketones, ur NEGATIVE  NEGATIVE mg/dL   Protein, ur NEGATIVE  NEGATIVE mg/dL   Urobilinogen, UA 0.2  0.0 - 1.0 mg/dL   Nitrite NEGATIVE  NEGATIVE   Leukocytes, UA NEGATIVE  NEGATIVE  PROTEIN / CREATININE RATIO, URINE     Status: Abnormal   Collection Time    12/30/13 11:35 PM      Result Value Ref Range   Creatinine, Urine 46.77     Total Protein, Urine 7.9     PROTEIN CREATININE RATIO 0.17 (*) 0.00 - 0.15  URINE MICROSCOPIC-ADD ON     Status: None   Collection Time    12/30/13 11:35 PM      Result Value Ref Range   Squamous Epithelial / LPF RARE  RARE   WBC, UA 0-2  <3 WBC/hpf   RBC / HPF 0-2  <3 RBC/hpf   Bacteria, UA RARE  RARE    MAU Course  Procedures None  MDM UA, CBC today Temp on admission 100.87F - 800 mg Ibuprofen given in MAU Temp down to  99.9 F Abdominal pain and headache resolved.  Discussed patient with Dr. Despina Hidden. Rx for Methergine x 1 day and HCTZ x 1 week. Assessment and Plan  A: Elevated BP postpartum Vaginal bleeding, postpartum Sinusitis  P: Discharge home Rx for Methergine, HCTZ,  Ibuprofen and Augmentin given to patient Bleeding precautions discussed Warning signs for pre-eclampsia discussed Patient advised to follow-up with [facility] as scheduled for routine PP exam and BP check in 1 week Patient may return to MAU as needed or if her condition were to change or worsen  Freddi StarrJulie N Ethier, PA-C  12/31/2013, 2:37 AM

## 2013-12-31 DIAGNOSIS — R030 Elevated blood-pressure reading, without diagnosis of hypertension: Secondary | ICD-10-CM

## 2013-12-31 LAB — PROTEIN / CREATININE RATIO, URINE
CREATININE, URINE: 46.77 mg/dL
PROTEIN CREATININE RATIO: 0.17 — AB (ref 0.00–0.15)
Total Protein, Urine: 7.9 mg/dL

## 2013-12-31 LAB — URINALYSIS, ROUTINE W REFLEX MICROSCOPIC
Bilirubin Urine: NEGATIVE
GLUCOSE, UA: NEGATIVE mg/dL
Ketones, ur: NEGATIVE mg/dL
LEUKOCYTES UA: NEGATIVE
Nitrite: NEGATIVE
Protein, ur: NEGATIVE mg/dL
Specific Gravity, Urine: 1.01 (ref 1.005–1.030)
Urobilinogen, UA: 0.2 mg/dL (ref 0.0–1.0)
pH: 6 (ref 5.0–8.0)

## 2013-12-31 LAB — URINE MICROSCOPIC-ADD ON

## 2013-12-31 MED ORDER — METHYLERGONOVINE MALEATE 0.2 MG PO TABS: 0.2000 mg | ORAL_TABLET | Freq: Four times a day (QID) | ORAL | Status: DC

## 2013-12-31 MED ORDER — AMOXICILLIN-POT CLAVULANATE 875-125 MG PO TABS
1.0000 | ORAL_TABLET | Freq: Two times a day (BID) | ORAL | Status: DC
Start: 2013-12-31 — End: 2014-01-16

## 2013-12-31 MED ORDER — IBUPROFEN 600 MG PO TABS: 600.0000 mg | ORAL_TABLET | Freq: Four times a day (QID) | ORAL | Status: DC

## 2013-12-31 MED ORDER — HYDROCHLOROTHIAZIDE 12.5 MG PO CAPS: 25.0000 mg | ORAL_CAPSULE | Freq: Every day | ORAL | Status: DC

## 2013-12-31 NOTE — Discharge Instructions (Signed)
Hypertension Hypertension is another name for high blood pressure. High blood pressure forces your heart to work harder to pump blood. A blood pressure reading has two numbers, which includes a higher number over a lower number (example: 110/72). HOME CARE   Have your blood pressure rechecked by your doctor.  Only take medicine as told by your doctor. Follow the directions carefully. The medicine does not work as well if you skip doses. Skipping doses also puts you at risk for problems.  Do not smoke.  Monitor your blood pressure at home as told by your doctor. GET HELP IF:  You think you are having a reaction to the medicine you are taking.  You have repeat headaches or feel dizzy.  You have puffiness (swelling) in your ankles.  You have trouble with your vision. GET HELP RIGHT AWAY IF:   You get a very bad headache and are confused.  You feel weak, numb, or faint.  You get chest or belly (abdominal) pain.  You throw up (vomit).  You cannot breathe very well. MAKE SURE YOU:   Understand these instructions.  Will watch your condition.  Will get help right away if you are not doing well or get worse. Document Released: 11/21/2007 Document Revised: 06/09/2013 Document Reviewed: 03/27/2013 [facility] Patient Information 2015 [place], [place]. This information is not intended to replace advice given to you by your health care provider. Make sure you discuss any questions you have with your health care provider. Preeclampsia and Eclampsia Preeclampsia is a serious condition that develops only during pregnancy. It is also called toxemia of pregnancy. This condition causes high blood pressure along with other symptoms, such as swelling and headaches. These may develop as the condition gets worse. Preeclampsia may occur 20 weeks or later into your pregnancy.  Diagnosing and treating preeclampsia early is very important. If not treated early, it can cause serious problems for you and  your baby. One problem it can lead to is eclampsia, which is a condition that causes muscle jerking or shaking (convulsions) in the mother. Delivering your baby is the best treatment for preeclampsia or eclampsia.  RISK FACTORS The cause of preeclampsia is not known. You may be more likely to develop preeclampsia if you have certain risk factors. These include:   Being pregnant for the first time.  Having preeclampsia in a past pregnancy.  Having a family history of preeclampsia.  Having high blood pressure.  Being pregnant with twins or triplets.  Being 4835 or older.  Being African American.  Having kidney disease or diabetes.  Having medical conditions such as lupus or blood diseases.  Being very overweight (obese). SIGNS AND SYMPTOMS  The earliest signs of preeclampsia are:  High blood pressure.  Increased protein in your urine. Your health care provider will check for this at every prenatal visit. Other symptoms that can develop include:   Severe headaches.  Sudden weight gain.  Swelling of your hands, face, legs, and feet.  Feeling sick to your stomach (nauseous) and throwing up (vomiting).  Vision problems (blurred or double vision).  Numbness in your face, arms, legs, and feet.  Dizziness.  Slurred speech.  Sensitivity to bright lights.  Abdominal pain. DIAGNOSIS  There are no screening tests for preeclampsia. Your health care provider will ask you about symptoms and check for signs of preeclampsia during your prenatal visits. You may also have tests, including:  Urine testing.  Blood testing.  Checking your baby's heart rate.  Checking the health of  your baby and your placenta using images created with sound waves (ultrasound). TREATMENT  You can work out the best treatment approach together with your health care provider. It is very important to keep all prenatal appointments. If you have an increased risk of preeclampsia, you may need more  frequent prenatal exams.  Your health care provider may prescribe bed rest.  You may have to eat as little salt as possible.  You may need to take medicine to lower your blood pressure if the condition does not respond to more conservative measures.  You may need to stay in the hospital if your condition is severe. There, treatment will focus on controlling your blood pressure and fluid retention. You may also need to take medicine to prevent seizures.  If the condition gets worse, your baby may need to be delivered early to protect you and the baby. You may have your labor started with medicine (be induced), or you may have a cesarean delivery.  Preeclampsia usually goes away after the baby is born. HOME CARE INSTRUCTIONS   Only take over-the-counter or prescription medicines as directed by your health care provider.  Lie on your left side while resting. This keeps pressure off your baby.  Elevate your feet while resting.  Get regular exercise. Ask your health care provider what type of exercise is safe for you.  Avoid caffeine and alcohol.  Do not smoke.  Drink 6-8 glasses of water every day.  Eat a balanced diet that is low in salt. Do not add salt to your food.  Avoid stressful situations as much as possible.  Get plenty of rest and sleep.  Keep all prenatal appointments and tests as scheduled. SEEK MEDICAL CARE IF:  You are gaining more weight than expected.  You have any headaches, abdominal pain, or nausea.  You are bruising more than usual.  You feel dizzy or light-headed. SEEK IMMEDIATE MEDICAL CARE IF:   You develop sudden or severe swelling anywhere in your body. This usually happens in the legs.  You gain 5 lb (2.3 kg) or more in a week.  You have a severe headache, dizziness, problems with your vision, or confusion.  You have severe abdominal pain.  You have lasting nausea or vomiting.  You have a seizure.  You have trouble moving any part of  your body.  You develop numbness in your body.  You have trouble speaking.  You have any abnormal bleeding.  You develop a stiff neck.  You pass out. MAKE SURE YOU:   Understand these instructions.  Will watch your condition.  Will get help right away if you are not doing well or get worse. Document Released: 06/01/2000 Document Revised: 06/09/2013 Document Reviewed: 03/27/2013 [facility] Patient Information 2015 [place], [place]. This information is not intended to replace advice given to you by your health care provider. Make sure you discuss any questions you have with your health care provider.

## 2014-01-04 ENCOUNTER — Encounter (HOSPITAL_COMMUNITY): Payer: Self-pay | Admitting: Emergency Medicine

## 2014-01-04 ENCOUNTER — Emergency Department (HOSPITAL_COMMUNITY)
Admission: EM | Admit: 2014-01-04 | Discharge: 2014-01-05 | Disposition: A | Discharge status: Home / Self Care | Payer: Medicaid Other | Attending: Emergency Medicine | Admitting: Emergency Medicine

## 2014-01-04 DIAGNOSIS — Z91148 Patient's other noncompliance with medication regimen for other reason: Secondary | ICD-10-CM

## 2014-01-04 DIAGNOSIS — Z9889 Other specified postprocedural states: Secondary | ICD-10-CM | POA: Diagnosis not present

## 2014-01-04 DIAGNOSIS — F17200 Nicotine dependence, unspecified, uncomplicated: Secondary | ICD-10-CM | POA: Insufficient documentation

## 2014-01-04 DIAGNOSIS — Z79899 Other long term (current) drug therapy: Secondary | ICD-10-CM | POA: Diagnosis not present

## 2014-01-04 DIAGNOSIS — F410 Panic disorder [episodic paroxysmal anxiety] without agoraphobia: Secondary | ICD-10-CM

## 2014-01-04 DIAGNOSIS — J45909 Unspecified asthma, uncomplicated: Secondary | ICD-10-CM | POA: Diagnosis not present

## 2014-01-04 DIAGNOSIS — Z91199 Patient's noncompliance with other medical treatment and regimen due to unspecified reason: Secondary | ICD-10-CM | POA: Diagnosis not present

## 2014-01-04 DIAGNOSIS — Z9114 Patient's other noncompliance with medication regimen: Secondary | ICD-10-CM

## 2014-01-04 DIAGNOSIS — Z8679 Personal history of other diseases of the circulatory system: Secondary | ICD-10-CM | POA: Diagnosis not present

## 2014-01-04 DIAGNOSIS — G40909 Epilepsy, unspecified, not intractable, without status epilepticus: Secondary | ICD-10-CM | POA: Diagnosis not present

## 2014-01-04 DIAGNOSIS — Z792 Long term (current) use of antibiotics: Secondary | ICD-10-CM | POA: Diagnosis not present

## 2014-01-04 DIAGNOSIS — F411 Generalized anxiety disorder: Secondary | ICD-10-CM | POA: Diagnosis present

## 2014-01-04 DIAGNOSIS — Z9119 Patient's noncompliance with other medical treatment and regimen: Secondary | ICD-10-CM | POA: Insufficient documentation

## 2014-01-04 LAB — CBG MONITORING, ED: Glucose-Capillary: 106 mg/dL — ABNORMAL HIGH (ref 70–99)

## 2014-01-04 NOTE — ED Notes (Signed)
Pt in no distress in room, pt called out asking for water and a [place] sandwich along with family member, pt given water but told to see doctor before given a sandwich, pt a/o x 4.

## 2014-01-04 NOTE — ED Notes (Signed)
Pt states that she felt a seizure aurora coming on and took her seizure medication; husband stated that for a full minute pt was shaking and and crying out stating it hurt and then had a few seconds where she would not respond; pt awake and alert in triage; pt states "I just don't feel normal"; pt recently had vaginal delivery on 7/5; pt was recently seen at MAU for vag bleeding, headache and chills; pt states that she continues to have a headache; pt states that she has been under increased stress and anxiety

## 2014-01-05 MED ORDER — IBUPROFEN 800 MG PO TABS
800.0000 mg | ORAL_TABLET | Freq: Once | ORAL | Status: AC
  Administered 2014-01-05: 800 mg via ORAL
  Filled 2014-01-05: qty 1

## 2014-01-05 NOTE — ED Provider Notes (Addendum)
CSN: [id number]     Arrival date & time 01/04/14  2223 History   First MD Initiated Contact with Patient 01/04/14 2328     Chief Complaint  Patient presents with  . Seizures  . Anxiety      HPI Patient reports possible seizure versus panic attack at home.  As reported that the patient had a minute and a shaking and crying out and stating that it hurt.  Patient reports consciousness at this time.  She was standing.  She did not fall.  No postictal period described.   She states she has a history of both.  She's been intermittently compliant with her seizure medications.  She did give birth to a healthy young child on 12/20/2013.  She's otherwise been doing well.  She reported mild headache earlier.  At time of my evaluation the patient states that she feels fine.  She no longer has any headache or any other complaints.  She states she would like to go home at this time.  She thinks this is likely a panic attack and not her seizure disorder.  Significant other in the room agrees and states that she looks good at this time.   Past Medical History  Diagnosis Date  . Asthma   . Aneurysm   . Panic attack   . 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 Brain surgery    . Tonsillectomy    . Aneurysm coiling     Family History  Problem Relation Age of Onset  . Thyroid disease Mother   . Aortic aneurysm Mother     heart   History  Substance Use Topics  . Smoking status: Light Tobacco Smoker -- 1.00 packs/day    Types: Cigarettes  . Smokeless tobacco: Never Used  . Alcohol Use: No   OB History   Grav Para Term Preterm Abortions TAB SAB Ect Mult Living   3 3 3       3      Review of Systems  All other systems reviewed and are negative.     Allergies  Hydroxacen and Vicodin  Home Medications   Prior to Admission medications   Medication Sig Start Date End Date Taking? Authorizing Provider   amoxicillin-clavulanate (AUGMENTIN) 875-125 MG per tablet Take 1 tablet by mouth every 12 (twelve) hours. 12/31/13   Freddi StarrJulie N Ethier, PA-C  hydrochlorothiazide (MICROZIDE) 12.5 MG capsule Take 2 capsules (25 mg total) by mouth daily. 12/31/13   Freddi StarrJulie N Ethier, PA-C  ibuprofen (ADVIL,MOTRIN) 600 MG tablet Take 1 tablet (600 mg total) by mouth every 6 (six) hours. 12/31/13   Freddi StarrJulie N Ethier, PA-C  lamoTRIgine (LAMICTAL) 150 MG tablet Take 150-300 mg by mouth 2 (two) times daily. Takes 150 mg in the morning and 300 mg in the evening    Historical Provider, MD  methylergonovine (METHERGINE) 0.2 MG tablet Take 1 tablet (0.2 mg total) by mouth every 6 (six) hours. 12/31/13   Freddi StarrJulie N Ethier, PA-C   BP 137/88  Pulse 100  Temp(Src) 99.2 F (37.3 C) (Oral)  Resp 18  SpO2 99% Physical Exam  Nursing note and vitals reviewed. Constitutional: She is oriented to person, place, and time. She appears well-developed and well-nourished. No distress.  HENT:  Head: Normocephalic and atraumatic.  Eyes: EOM are normal. Pupils are equal, round, and reactive to light.  Neck: Normal range of motion.  Cardiovascular: Normal rate,  regular rhythm and normal heart sounds.   Pulmonary/Chest: Effort normal and breath sounds normal.  Abdominal: Soft. She exhibits no distension. There is no tenderness.  Musculoskeletal: Normal range of motion.  Neurological: She is alert and oriented to person, place, and time.  5/5 strength in major muscle groups of  bilateral upper and lower extremities. Speech normal. No facial asymetry.   Skin: Skin is warm and dry.  Psychiatric: She has a normal mood and affect. Judgment normal.    ED Course  Procedures (including critical care time) Labs Review Labs Reviewed  CBG MONITORING, ED - Abnormal; Notable for the following:    Glucose-Capillary 106 (*)    All other components within normal limits    Imaging Review No results found.   EKG Interpretation None      MDM   Final  diagnoses:  Panic attack  Seizure disorder  Noncompliance with medications    Her symptoms are more consistent with pancreatitis after discussing this with her at length.  I do not think this is a eclampsia.  Discharge home in good condition.  PCP followup.    Lyanne Co, MD 01/05/14 1610  Lyanne Co, MD 01/05/14 [phone]

## 2014-01-08 ENCOUNTER — Encounter: Payer: Self-pay | Admitting: General Practice

## 2014-01-16 ENCOUNTER — Emergency Department (HOSPITAL_COMMUNITY)
Admission: EM | Admit: 2014-01-16 | Discharge: 2014-01-16 | Disposition: A | Discharge status: Home / Self Care | Payer: Medicaid Other | Attending: Emergency Medicine | Admitting: Emergency Medicine

## 2014-01-16 ENCOUNTER — Encounter (HOSPITAL_COMMUNITY): Payer: Self-pay | Admitting: Emergency Medicine

## 2014-01-16 ENCOUNTER — Emergency Department (HOSPITAL_COMMUNITY): Payer: Medicaid Other

## 2014-01-16 DIAGNOSIS — O26899 Other specified pregnancy related conditions, unspecified trimester: Secondary | ICD-10-CM | POA: Diagnosis present

## 2014-01-16 DIAGNOSIS — G40909 Epilepsy, unspecified, not intractable, without status epilepticus: Secondary | ICD-10-CM | POA: Diagnosis not present

## 2014-01-16 DIAGNOSIS — O99355 Diseases of the nervous system complicating the puerperium: Secondary | ICD-10-CM | POA: Diagnosis not present

## 2014-01-16 DIAGNOSIS — Z79899 Other long term (current) drug therapy: Secondary | ICD-10-CM | POA: Diagnosis not present

## 2014-01-16 DIAGNOSIS — IMO0002 Reserved for concepts with insufficient information to code with codable children: Secondary | ICD-10-CM | POA: Insufficient documentation

## 2014-01-16 DIAGNOSIS — O99345 Other mental disorders complicating the puerperium: Secondary | ICD-10-CM | POA: Insufficient documentation

## 2014-01-16 DIAGNOSIS — O909 Complication of the puerperium, unspecified: Secondary | ICD-10-CM | POA: Diagnosis present

## 2014-01-16 DIAGNOSIS — O99330 Smoking (tobacco) complicating pregnancy, unspecified trimester: Secondary | ICD-10-CM | POA: Diagnosis not present

## 2014-01-16 DIAGNOSIS — F410 Panic disorder [episodic paroxysmal anxiety] without agoraphobia: Secondary | ICD-10-CM | POA: Diagnosis not present

## 2014-01-16 DIAGNOSIS — R42 Dizziness and giddiness: Secondary | ICD-10-CM

## 2014-01-16 DIAGNOSIS — J45909 Unspecified asthma, uncomplicated: Secondary | ICD-10-CM | POA: Diagnosis not present

## 2014-01-16 DIAGNOSIS — R51 Headache: Secondary | ICD-10-CM | POA: Diagnosis not present

## 2014-01-16 DIAGNOSIS — Z8679 Personal history of other diseases of the circulatory system: Secondary | ICD-10-CM | POA: Diagnosis not present

## 2014-01-16 DIAGNOSIS — I10 Essential (primary) hypertension: Secondary | ICD-10-CM

## 2014-01-16 LAB — URINALYSIS, ROUTINE W REFLEX MICROSCOPIC
Bilirubin Urine: NEGATIVE
Glucose, UA: NEGATIVE mg/dL
Hgb urine dipstick: NEGATIVE
Ketones, ur: NEGATIVE mg/dL
LEUKOCYTES UA: NEGATIVE
NITRITE: NEGATIVE
PROTEIN: NEGATIVE mg/dL
SPECIFIC GRAVITY, URINE: 1.024 (ref 1.005–1.030)
Urobilinogen, UA: 0.2 mg/dL (ref 0.0–1.0)
pH: 6 (ref 5.0–8.0)

## 2014-01-16 LAB — I-STAT CHEM 8, ED
BUN: 14 mg/dL (ref 6–23)
CALCIUM ION: 1.18 mmol/L (ref 1.12–1.23)
CREATININE: 0.8 mg/dL (ref 0.50–1.10)
Chloride: 103 mEq/L (ref 96–112)
GLUCOSE: 86 mg/dL (ref 70–99)
HEMATOCRIT: 45 % (ref 36.0–46.0)
HEMOGLOBIN: 15.3 g/dL — AB (ref 12.0–15.0)
Potassium: 4.1 mEq/L (ref 3.7–5.3)
Sodium: 139 mEq/L (ref 137–147)
TCO2: 25 mmol/L (ref 0–100)

## 2014-01-16 LAB — CBC
HEMATOCRIT: 39.6 % (ref 36.0–46.0)
HEMOGLOBIN: 13.4 g/dL (ref 12.0–15.0)
MCH: 31.8 pg (ref 26.0–34.0)
MCHC: 33.8 g/dL (ref 30.0–36.0)
MCV: 94.1 fL (ref 78.0–100.0)
Platelets: 333 10*3/uL (ref 150–400)
RBC: 4.21 MIL/uL (ref 3.87–5.11)
RDW: 13.2 % (ref 11.5–15.5)
WBC: 5.7 10*3/uL (ref 4.0–10.5)

## 2014-01-16 MED ORDER — HYDROCHLOROTHIAZIDE 25 MG PO TABS
25.0000 mg | ORAL_TABLET | Freq: Every day | ORAL | Status: DC
Start: 2014-01-16 — End: 2014-02-16

## 2014-01-16 NOTE — ED Notes (Signed)
Pt complains of blurry vision and dizziness for several days, she states she was given a prescription for blood pressure medication and lost it

## 2014-01-16 NOTE — ED Provider Notes (Signed)
CSN: [id number]     Arrival date & time 01/16/14  0133 History   First MD Initiated Contact with Patient 01/16/14 [phone]     Chief Complaint  Patient presents with  . Blurred Vision     (Consider location/radiation/quality/duration/timing/severity/associated sxs/prior Treatment) HPI Comments: Patient is PP 26 days now with persistent HTN (did not fill Rx for HCTZ), dizziness, off balance when she walks, persistent lower extremity swelling   The history is provided by the patient.    Past Medical History  Diagnosis Date  . Asthma   . Aneurysm   . Panic attack   . 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 Brain surgery    . Tonsillectomy    . Aneurysm coiling     Family History  Problem Relation Age of Onset  . Thyroid disease Mother   . Aortic aneurysm Mother     heart   History  Substance Use Topics  . Smoking status: Light Tobacco Smoker -- 1.00 packs/day    Types: Cigarettes  . Smokeless tobacco: Never Used  . Alcohol Use: No   OB History   Grav Para Term Preterm Abortions TAB SAB Ect Mult Living   3 3 3       3      Review of Systems  Constitutional: Negative for fever and chills.  Eyes: Positive for visual disturbance.  Respiratory: Negative for shortness of breath.   Cardiovascular: Positive for leg swelling. Negative for chest pain.  Gastrointestinal: Negative for abdominal pain and abdominal distention.  Neurological: Positive for dizziness.  All other systems reviewed and are negative.     Allergies  Hydroxacen and Vicodin  Home Medications   Prior to Admission medications   Medication Sig Start Date End Date Taking? Authorizing Provider  lamoTRIgine (LAMICTAL) 150 MG tablet Take 150-300 mg by mouth 2 (two) times daily. Takes 150 mg in the morning and 300 mg in the evening   Yes Historical Provider, MD  hydrochlorothiazide (HYDRODIURIL) 25 MG tablet Take 1 tablet (25 mg total)  by mouth daily. 01/16/14   Arman FilterGail K Carrine Kroboth, NP   BP 119/106  Pulse 78  Temp(Src) 98.2 F (36.8 C) (Oral)  Resp 18  SpO2 100%  LMP 12/20/2013  Breastfeeding? Unknown Physical Exam  Nursing note and vitals reviewed. Constitutional: She is oriented to person, place, and time. She appears well-developed and well-nourished.  HENT:  Head: Normocephalic.  Eyes: Pupils are equal, round, and reactive to light.  Neck: Normal range of motion.  Cardiovascular: Normal rate and regular rhythm.   Pulmonary/Chest: Effort normal and breath sounds normal.  Abdominal: Soft. She exhibits no distension.  Musculoskeletal: She exhibits edema. She exhibits no tenderness.  Neurological: She is alert and oriented to person, place, and time.  Skin: Skin is warm and dry.  Psychiatric: She has a normal mood and affect.    ED Course  Procedures (including critical care time) Labs Review Labs Reviewed  URINALYSIS, ROUTINE W REFLEX MICROSCOPIC - Abnormal; Notable for the following:    APPearance CLOUDY (*)    All other components within normal limits  I-STAT CHEM 8, ED - Abnormal; Notable for the following:    Hemoglobin 15.3 (*)    All other components within normal limits  CBC    Imaging Review No results found.   EKG Interpretation None      MDM  Concern for eclampsia  Will  obtain head CT scan, lab and urine  Final diagnoses:  Essential hypertension  Dizzy         Arman Filter, NP 01/18/14 1954

## 2014-01-16 NOTE — ED Notes (Signed)
Pt states she has been eating a lot of rice and mayonnaise and roman noodles,  So not sure if salt in the products are making her blood pressure rise and blurring her vision

## 2014-01-16 NOTE — Discharge Instructions (Signed)
Dizziness °Dizziness is a common problem. It is a feeling of unsteadiness or light-headedness. You may feel like you are about to faint. Dizziness can lead to injury if you stumble or fall. A person of any age group can suffer from dizziness, but dizziness is more common in older adults. °CAUSES  °Dizziness can be caused by many different things, including: °· Middle ear problems. °· Standing for too long. °· Infections. °· An allergic reaction. °· Aging. °· An emotional response to something, such as the sight of blood. °· Side effects of medicines. °· Tiredness. °· Problems with circulation or blood pressure. °· Excessive use of alcohol or medicines, or illegal drug use. °· Breathing too fast (hyperventilation). °· An irregular heart rhythm (arrhythmia). °· A low red blood cell count (anemia). °· Pregnancy. °· Vomiting, diarrhea, fever, or other illnesses that cause body fluid loss (dehydration). °· Diseases or conditions such as Parkinson's disease, high blood pressure (hypertension), diabetes, and thyroid problems. °· Exposure to extreme heat. °DIAGNOSIS  °Your health care provider will ask about your symptoms, perform a physical exam, and perform an electrocardiogram (ECG) to record the electrical activity of your heart. Your health care provider may also perform other heart or blood tests to determine the cause of your dizziness. These may include: °· Transthoracic echocardiogram (TTE). During echocardiography, sound waves are used to evaluate how blood flows through your heart. °· Transesophageal echocardiogram (TEE). °· Cardiac monitoring. This allows your health care provider to monitor your heart rate and rhythm in real time. °· Holter monitor. This is a portable device that records your heartbeat and can help diagnose heart arrhythmias. It allows your health care provider to track your heart activity for several days if needed. °· Stress tests by exercise or by giving medicine that makes the heart beat  faster. °TREATMENT  °Treatment of dizziness depends on the cause of your symptoms and can vary greatly. °HOME CARE INSTRUCTIONS  °· Drink enough fluids to keep your urine clear or pale yellow. This is especially important in very hot weather. In older adults, it is also important in cold weather. °· Take your medicine exactly as directed if your dizziness is caused by medicines. When taking blood pressure medicines, it is especially important to get up slowly. °¨ Rise slowly from chairs and steady yourself until you feel okay. °¨ In the morning, first sit up on the side of the bed. When you feel okay, stand slowly while holding onto something until you know your balance is fine. °· Move your legs often if you need to stand in one place for a long time. Tighten and relax your muscles in your legs while standing. °· Have someone stay with you for 1-2 days if dizziness continues to be a problem. Do this until you feel you are well enough to stay alone. Have the person call your health care provider if he or she notices changes in you that are concerning. °· Do not drive or use heavy machinery if you feel dizzy. °· Do not drink alcohol. °SEEK IMMEDIATE MEDICAL CARE IF:  °· Your dizziness or light-headedness gets worse. °· You feel nauseous or vomit. °· You have problems talking, walking, or using your arms, hands, or legs. °· You feel weak. °· You are not thinking clearly or you have trouble forming sentences. It may take a friend or family member to notice this. °· You have chest pain, abdominal pain, shortness of breath, or sweating. °· Your vision changes. °· You notice   any bleeding.  You have side effects from medicine that seems to be getting worse rather than better. MAKE SURE YOU:   Understand these instructions.  Will watch your condition.  Will get help right away if you are not doing well or get worse. Document Released: 11/28/2000 Document Revised: 06/09/2013 Document Reviewed: 12/22/2010 [facility]  Patient Information 2015 [employer], [place]. This information is not intended to replace advice given to you by your health care provider. Make sure you discuss any questions you have with your health care provider. Today your lab values and CT Scan are normal You have been give a prescription for your blood pressure medication Please take this on a regular basis and follow up with your OB in 2 weeks as scheduled

## 2014-01-16 NOTE — ED Notes (Signed)
Pt wears corrective lenses but has not been wearing them. Also stated that at night where there is a lot of light, during driving pt experiences it mostly.  and during the day time has to wear sun glasses. RN notified  MD notifited

## 2014-01-16 NOTE — ED Notes (Signed)
Pt walked without difficulty,  Pt also had a Malaw[place]urkey sandwich and sprite as well as her mate at bedside

## 2014-01-16 NOTE — ED Notes (Signed)
Patient transported to CT 

## 2014-01-23 NOTE — ED Provider Notes (Signed)
Medical screening examination/treatment/procedure(s) were performed by non-physician practitioner and as supervising physician I was immediately available for consultation/collaboration.   EKG Interpretation None       Raeford RazorStephen Karcyn Menn, MD 01/23/14 [phone]

## 2014-01-29 ENCOUNTER — Encounter: Payer: Self-pay | Admitting: General Practice

## 2014-02-04 ENCOUNTER — Ambulatory Visit (INDEPENDENT_AMBULATORY_CARE_PROVIDER_SITE_OTHER): Payer: Medicaid Other | Admitting: Obstetrics and Gynecology

## 2014-02-04 ENCOUNTER — Encounter: Payer: Self-pay | Admitting: Obstetrics and Gynecology

## 2014-02-04 ENCOUNTER — Encounter: Payer: Self-pay | Admitting: Family Medicine

## 2014-02-04 NOTE — Progress Notes (Signed)
Patient ID: Hannah Cook, female   DOB: 12/20/1980, 33 y.o.   MRN: [id number]  [facility] requesting to be seen as a new patient for hypertension.  She was [facility] this [facility], she delivered on July 5th, 2015.  She is disappointed by being told by front office staff that [facility] until [facility] and asks to speak with a physician.  Reports that she is concerned about her high blood pressure and wants to establish care. I assured her that I would forward her information to our clinic leadership and we would be back in touch with her.  Her cell phone is [phone].  Paula ComptonJames Mujahid Jalomo, MD

## 2014-02-04 NOTE — Progress Notes (Signed)
Referral to [facility] for PCP/f/u hypertension faxed. Patient given paperwork to fill out and mail back and informed that appointment will not be made until they receive information. Patient stated she would bring it to the office. No questions or concerns.

## 2014-02-04 NOTE — Progress Notes (Addendum)
  Subjective:     Hannah Cook is a 33 y.o. female who presents for a postpartum visit. She is 6 weeks postpartum following a spontaneous vaginal delivery. I have fully reviewed the prenatal and intrapartum course. The delivery was at 38.6 gestational weeks. Outcome: spontaneous vaginal delivery. Anesthesia: epidural. Postpartum course has been uncomplicated. Baby's course has been uncomplicated. Baby is feeding by breast. Bleeding thick, heavy lochia since delivery. Patient did not take methergine as prescribed. Patient is not certain if onset of menses. Bowel function is normal. Bladder function is normal. Patient is not sexually active. Contraception method is none. Postpartum depression screening: negative.     Review of Systems A comprehensive review of systems was negative.   Objective:    LMP 12/20/2013  General:  alert, cooperative and no distress   Breasts:  inspection negative, no nipple discharge or bleeding, no masses or nodularity palpable  Lungs: clear to auscultation bilaterally  Heart:  regular rate and rhythm  Abdomen: soft, non-tender; bowel sounds normal; no masses,  no organomegaly   Vulva:  normal  Vagina: normal vagina, no discharge, exudate, lesion, or erythema  Cervix:  multiparous appearance  Corpus: normal size, contour, position, consistency, mobility, non-tender  Adnexa:  normal adnexa and no mass, fullness, tenderness  Rectal Exam:         Assessment:     Normal postpartum exam. Pap smear not done at today's visit.   Plan:    1. Contraception: condoms 2. Patient medically cleared to resume all activities of daily living Pelvic ultrasound ordered to rule out retained POC [facility] of other medical problems 3. Follow up in: december for annual exam  or as needed.

## 2014-02-04 NOTE — Patient Instructions (Signed)

## 2014-02-04 NOTE — Progress Notes (Signed)
Patient stated that [facility] told her that she didn't need to be breastfeeding because of her medications but [facility] told her it was safe. She states that it is our fault her milk production is gone. She also states that no one told her that she had high blood pressure while she was in the hospital. She thinks the hctz doesn't work. BP today is 103/70.

## 2014-02-05 NOTE — Progress Notes (Signed)
Called patient and explained that since she was seen at [facility] and diagnosed with HTN, we would establish her as a patient.  Went ahead and scheduled her to see Dr. Mal MistyNetty on 9/1.  I will have our NP coordinator  Send her a NP packet today.

## 2014-02-09 ENCOUNTER — Ambulatory Visit (HOSPITAL_COMMUNITY): Admission: RE | Admit: 2014-02-09 | Payer: Medicaid Other | Source: Ambulatory Visit

## 2014-02-10 ENCOUNTER — Encounter: Payer: Self-pay | Admitting: *Deleted

## 2014-02-14 IMAGING — US US OB NUCHAL TRANSLUCENCY 1ST GEST
1 series · 13 of 28 positions shown · non-contrast
Comparison: none

[Series 1: us ob nuchal translucency 1st gest · 0.21mm/px · 13 of 34 slices shown]
[im 2/34]
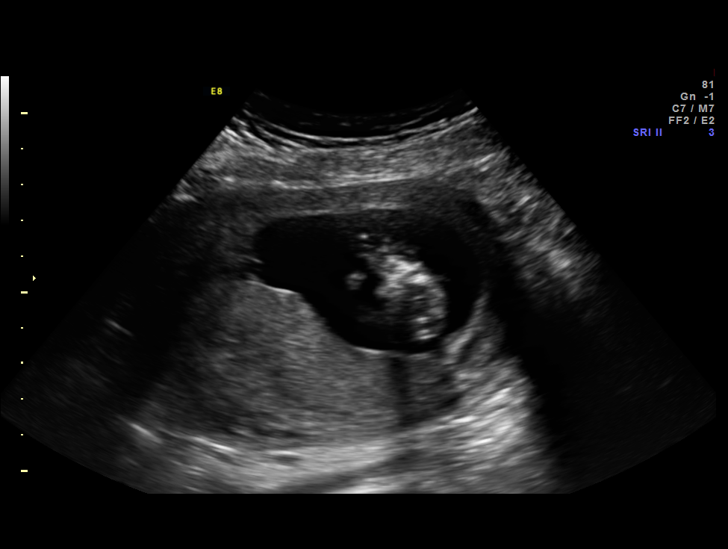
[im 4/34]
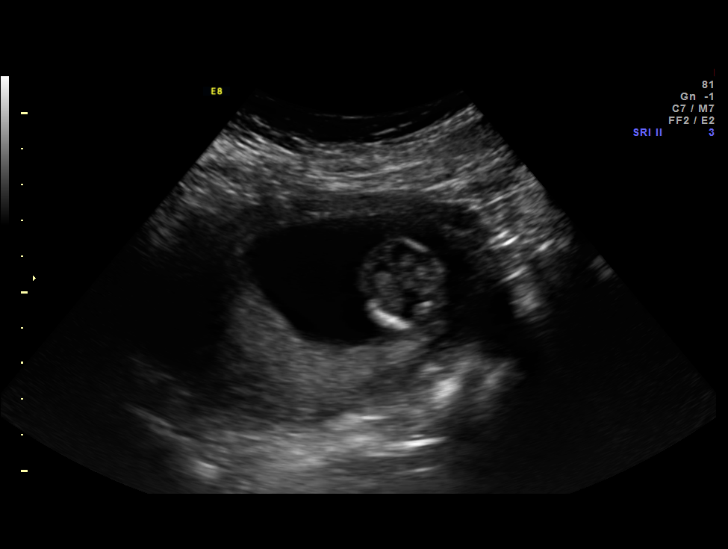
[im 7/34]
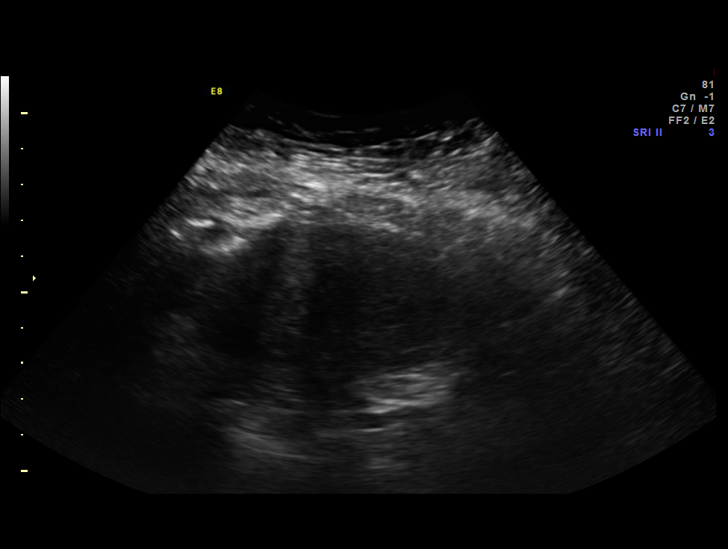
[im 9/34]
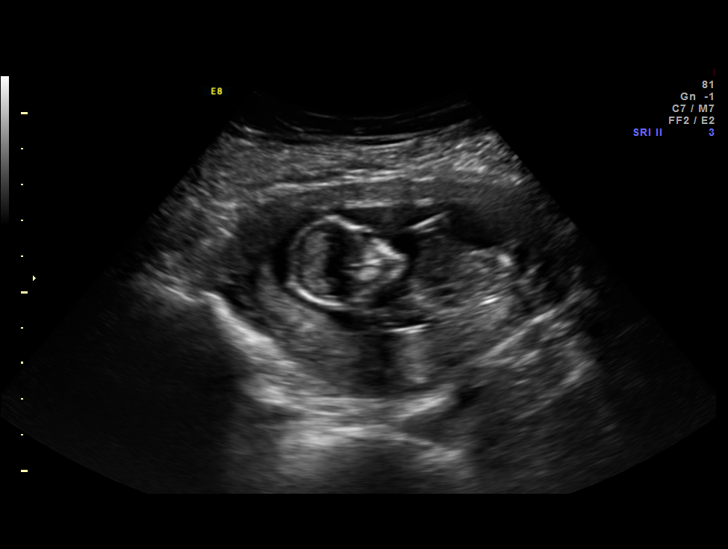
[im 12/34]
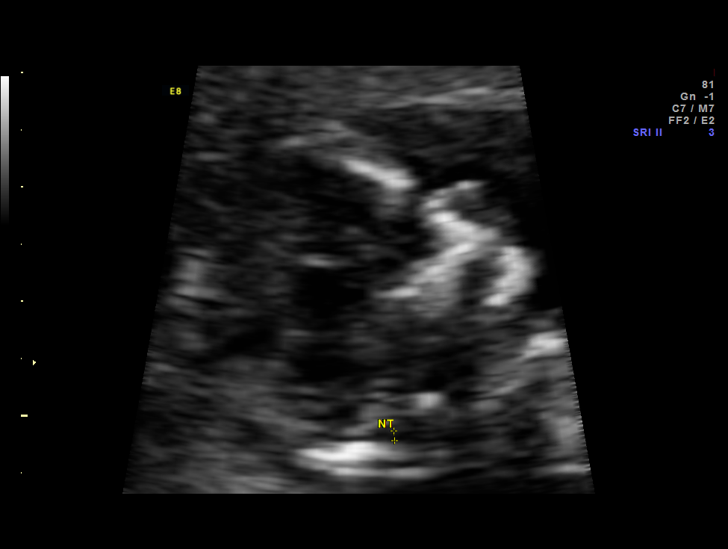
[im 14/34]
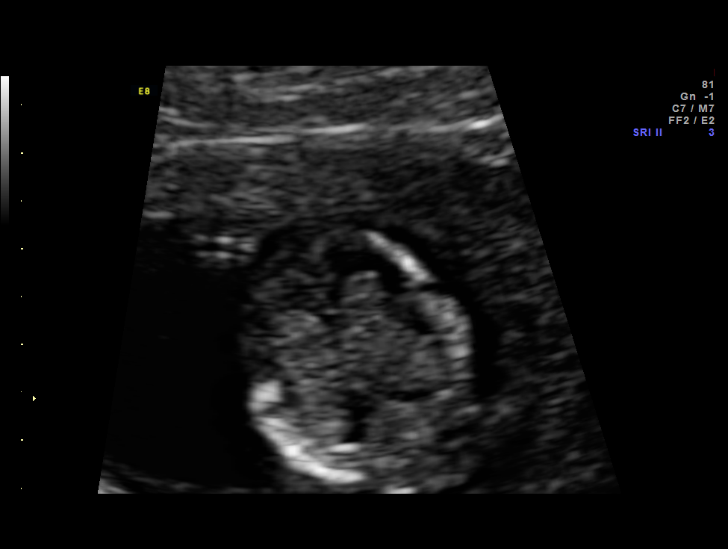
[im 18/34]
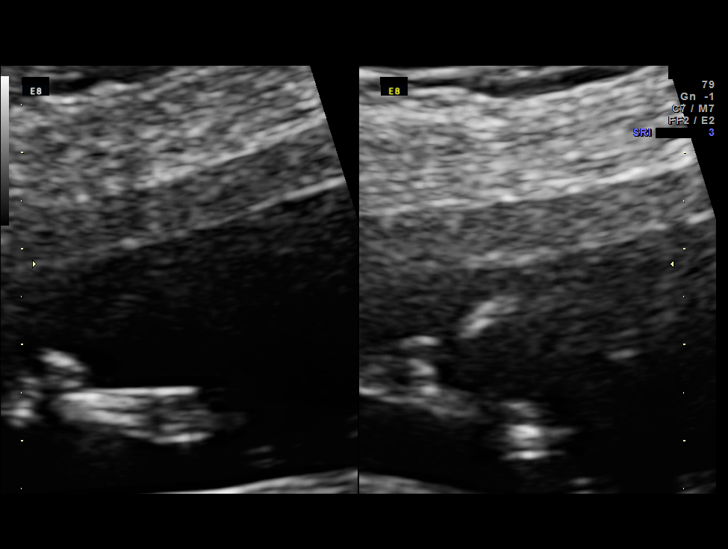
[im 20/34]
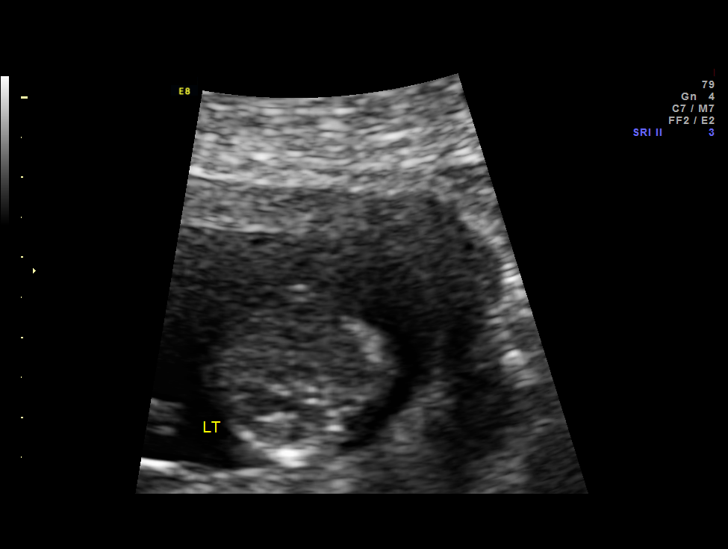
[im 23/34]
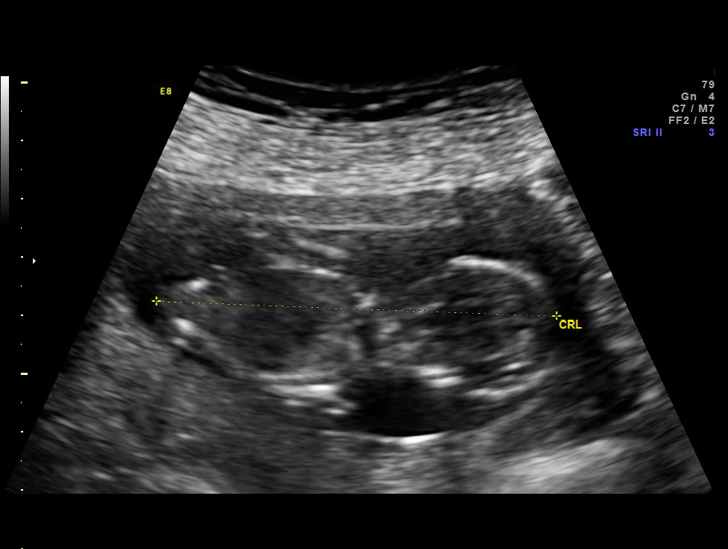
[im 25/34]
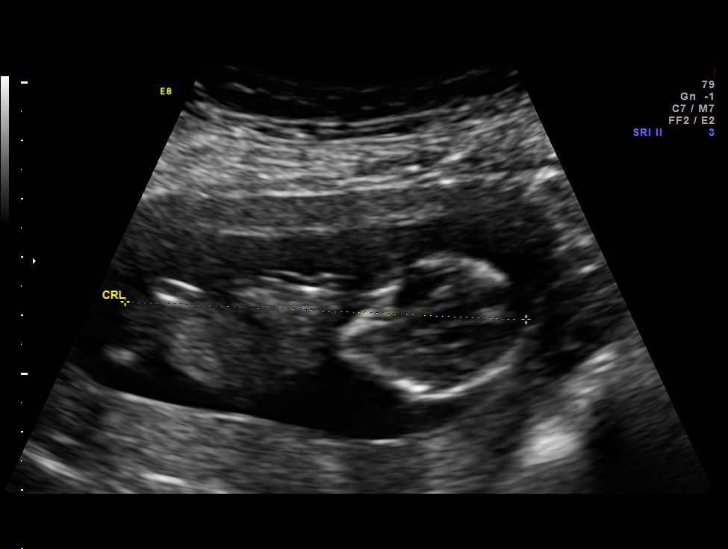
[im 27/34]
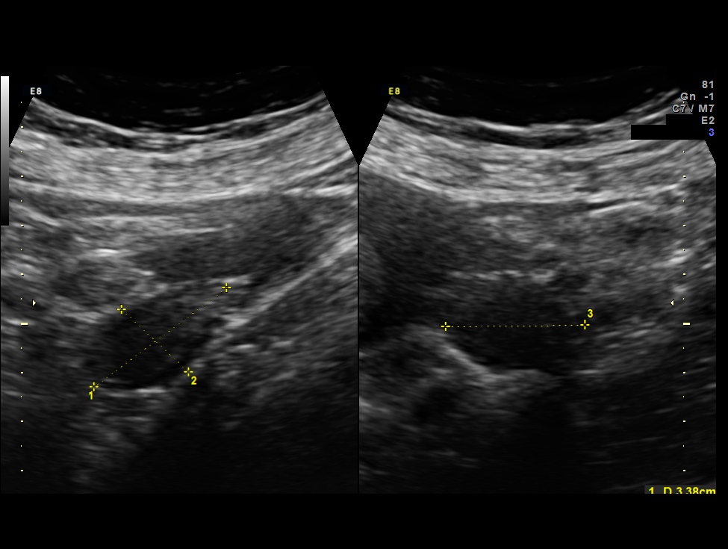
[im 30/34]
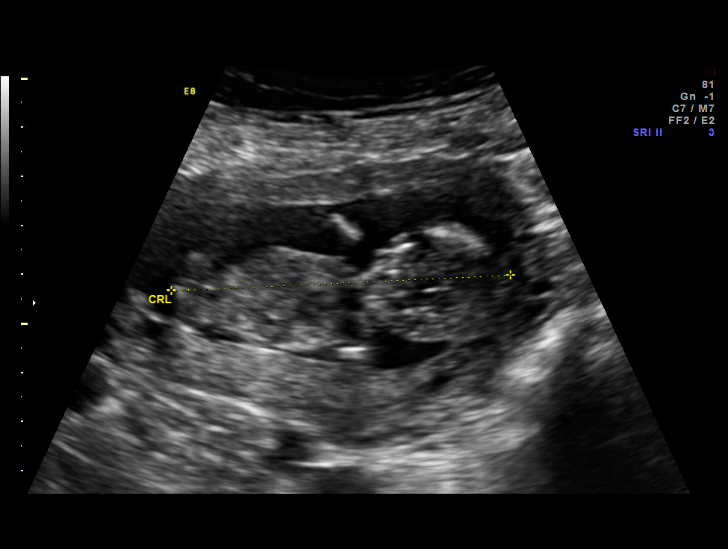
[im 32/34]
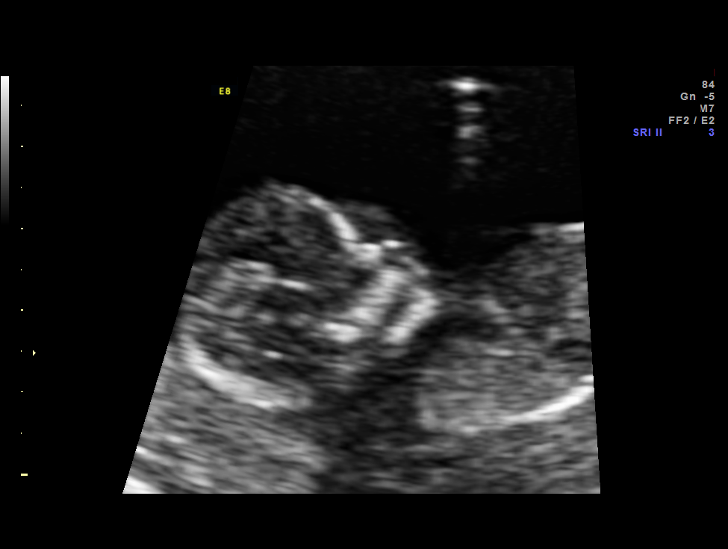

[13 of 28 positions shown; findings below may reference images not displayed]

OBSTETRICS REPORT
                      (Signed Final 06/16/2013 [DATE])

 Name:       WINARSIH BULNER                 Visit Date: 06/16/2013 [DATE]

Service(s) Provided

 US FETAL NUCHAL TRANSLUCENCY                          76813.0
 MEASUREMENT
Indications

 First trimester aneuploidy screen (NT)
 Previous brain surgery
 Seizure disorder
Fetal Evaluation

 Num Of Fetuses:    1
 Gest. Sac:         Intrauterine
 Fetal Pole:        Visualized
 Fetal Heart Rate:  157                          bpm
 Cardiac Activity:  Observed
Biometry

 CRL:     69.4  mm     G. Age:  13w 0d                 EDD:    12/22/13

 NT:       1.5  mm
Gestational Age

 LMP:           12w 2d        Date:  03/22/13                 EDD:   12/27/13
 Best:          12w 2d     Det. By:  LMP  (03/22/13)          EDD:   12/27/13
1st Trimester Genetic Sonogram Screening

 Nasal Bone:                 Present
Cervix Uterus Adnexa

 Cervix:       Closed.
 Uterus:       No abnormality visualized.
 Cul De Sac:   No free fluid seen.
 Left Ovary:    Within normal limits.
 Right Ovary:   Within normal limits.

 Adnexa:     No abnormality visualized.
Impression

 Single IUP at 12 [DATE] weeks
 NT of 1.5 mm noted.  Nasal bone visualized
 First trimester aneuploidy screen performed as noted above.
Recommendations

 Please do not draw triple/quad screen, though patient should
 be offered [facility] for neural tube defect screening.
 Recommend follow up ultrasound at 18 weeks for detailed
 anatomy due to history of seizure disorder.

## 2014-02-16 ENCOUNTER — Encounter: Payer: Self-pay | Admitting: Family Medicine

## 2014-02-16 ENCOUNTER — Ambulatory Visit (INDEPENDENT_AMBULATORY_CARE_PROVIDER_SITE_OTHER): Payer: Medicaid Other | Admitting: Family Medicine

## 2014-02-16 VITALS — BP 110/55 | HR 90 | Temp 97.7°F | Ht 66.0 in | Wt 193.7 lb

## 2014-02-16 DIAGNOSIS — Z8639 Personal history of other endocrine, nutritional and metabolic disease: Secondary | ICD-10-CM

## 2014-02-16 DIAGNOSIS — E669 Obesity, unspecified: Secondary | ICD-10-CM

## 2014-02-16 DIAGNOSIS — Z862 Personal history of diseases of the blood and blood-forming organs and certain disorders involving the immune mechanism: Secondary | ICD-10-CM

## 2014-02-16 DIAGNOSIS — Z0000 Encounter for general adult medical examination without abnormal findings: Secondary | ICD-10-CM

## 2014-02-16 LAB — POCT GLYCOSYLATED HEMOGLOBIN (HGB A1C): Hemoglobin A1C: 5.6

## 2014-02-16 NOTE — Patient Instructions (Signed)
Thank you for coming to see me today. It was a pleasure. We established your care. I am getting some labs today.  Please make an appointment to see me in 2-4 weeks for follow-up to talk about weight.  If you have any questions or concerns, please do not hesitate to call the office at [phone].  Sincerely,  Jacquelin Hawking, MD

## 2014-02-16 NOTE — Progress Notes (Signed)
   Subjective:    Patient ID: Hannah Cook, female    DOB: Sep 08, 1980, 33 y.o.   MRN: [id number]  HPI  Complaints: fatigue. Has history of thyroid disease but also thinks it could be related to weight gain.  Past Medical History  Diagnosis Date  . Asthma   . Aneurysm   . Panic attack   . 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 Aneurysm 2002  . Thyroid disease    Past Surgical History  Procedure Laterality Date  . Brain surgery    . Tonsillectomy    . Aneurysm coiling     Family History  Problem Relation Age of Onset  . Thyroid disease Mother   . Aortic aneurysm Mother     heart  . Diabetes Mother   . Hypertension Maternal Aunt   . Hypertension Maternal Uncle   . Hypertension Maternal Grandmother   . Diabetes Maternal Grandmother    History   Social History  . Marital Status: Single    Spouse Name: N/A    Number of Children: N/A  . Years of Education: N/A   Social History Main Topics  . Smoking status: Former Smoker -- 2.00 packs/day for 18 years    Types: Cigarettes    Quit date: 12/19/2013  . Smokeless tobacco: Never Used  . Alcohol Use: No  . Drug Use: No  . Sexual Activity: Yes    Birth Control/ Protection: Condom     Comment: With men only   Other Topics Concern  . None   Social History Narrative  . None   Allergies  Allergen Reactions  . Hydroxacen [Hydroxyzine] Itching and Nausea And Vomiting  . Vicodin [Hydrocodone-Acetaminophen] Itching, Nausea And Vomiting and Other (See Comments)    Headache.     Review of Systems  Constitutional: Positive for fatigue.       Objective:  BP 110/55  Pulse 90  Temp(Src) 97.7 F (36.5 C) (Oral)  Ht  (1.676 m)  Wt 193 lb 11.2 oz (87.862 kg)  BMI 31.28 kg/m2  Physical Exam  Vitals reviewed. Constitutional: She is oriented to person, place, and time. She appears well-developed and well-nourished.  Eyes: Conjunctivae are normal. Pupils are equal, round, and reactive to  light.  Neck: Normal range of motion. Neck supple. No thyromegaly present.  Cardiovascular: Normal rate, regular rhythm and normal heart sounds.   No murmur heard. Pulmonary/Chest: Effort normal. No respiratory distress. She has no wheezes. She has no rales.  Abdominal: Bowel sounds are normal. She exhibits no distension.  Musculoskeletal: Normal range of motion. She exhibits no edema and no tenderness.  Neurological: She is alert and oriented to person, place, and time. No cranial nerve deficit.  Skin: Skin is warm and dry.  Psychiatric: She has a normal mood and affect.        Assessment & Plan:   33yo female presenting for establishment of care.

## 2014-02-16 NOTE — Assessment & Plan Note (Signed)
Will discuss plan to achieve goal set for weight loss at next office visit. Will check TSH since history of thyroid disease.

## 2014-02-17 LAB — COMPREHENSIVE METABOLIC PANEL
ALBUMIN: 4.3 g/dL (ref 3.5–5.2)
ALT: 15 U/L (ref 0–35)
AST: 20 U/L (ref 0–37)
Alkaline Phosphatase: 117 U/L (ref 39–117)
BUN: 13 mg/dL (ref 6–23)
CALCIUM: 9.3 mg/dL (ref 8.4–10.5)
CHLORIDE: 102 meq/L (ref 96–112)
CO2: 28 mEq/L (ref 19–32)
Creat: 0.91 mg/dL (ref 0.50–1.10)
Glucose, Bld: 71 mg/dL (ref 70–99)
POTASSIUM: 3.9 meq/L (ref 3.5–5.3)
Sodium: 140 mEq/L (ref 135–145)
TOTAL PROTEIN: 7 g/dL (ref 6.0–8.3)
Total Bilirubin: 0.3 mg/dL (ref 0.2–1.2)

## 2014-02-17 LAB — TSH: TSH: 0.591 u[IU]/mL (ref 0.350–4.500)

## 2014-02-17 LAB — LDL CHOLESTEROL, DIRECT: Direct LDL: 118 mg/dL — ABNORMAL HIGH

## 2014-02-19 ENCOUNTER — Encounter: Payer: Self-pay | Admitting: Family Medicine

## 2014-02-26 ENCOUNTER — Ambulatory Visit (INDEPENDENT_AMBULATORY_CARE_PROVIDER_SITE_OTHER): Payer: Medicaid Other | Admitting: Family Medicine

## 2014-02-26 ENCOUNTER — Encounter: Payer: Self-pay | Admitting: Family Medicine

## 2014-02-26 VITALS — BP 110/70 | HR 76 | Temp 98.6°F | Ht 65.0 in | Wt 190.0 lb

## 2014-02-26 DIAGNOSIS — Z713 Dietary counseling and surveillance: Secondary | ICD-10-CM

## 2014-02-26 NOTE — Patient Instructions (Addendum)
Thank you for coming to see me today. It was a pleasure. Today we talked about:   Weight loss: I am giving you a handout that will help you design your meals. We also discussed these key points  Eat 5 times per day (Breakfast, Lunch and Dinner with two snacks). Do not go more than 5 hours without eating during the day  Eat within 1 hour of waking up and do not eat within 2 hours of sleeping  Keep a food diary: record when you wake up, what you eat, what time you eat, how much you eat and what time you went to bed  Exercise 30-60 minutes per day. This includes moving around your home. Just try to stay as active as possible  We discussed increasing your walking. This will be great to help boost your energy overall.  Please make an appointment to see me in 1 month for follow-up.  If you have any questions or concerns, please do not hesitate to call the office at [phone].  Sincerely,  Jacquelin Hawking, MD   a

## 2014-02-26 NOTE — Progress Notes (Signed)
    Subjective    Hannah Cook is a 33 y.o. female that presents for an office visit.   Weight loss  24-hour recall 8:00AM: Woke up 12:00PM: Oatmeal cake x2 and Fanta cup 11:00PM: Mashed potatoes, chicken patties x2 and orange Fanta cup  This is somewhat of a typical day.  Preparation of meals: she eats out about 4 times per week, eating mostly [employer] meals   Carbohydrates: She eats a lot of carbohydrates, including drinking sugary drinks, eating corn, etc Protein: she prefers baking her chicken when she cooks Vegetables/fruits: She does not regularly eat any vegetables. She likes salad but uses a lot of salad dressing, including ranch and mayonnaise Barriers to healthy eating: none  Exercise: she does not exercise Barriers to exercise: motivation   History  Substance Use Topics  . Smoking status: Former Smoker -- 2.00 packs/day for 18 years    Types: Cigarettes    Quit date: 12/19/2013  . Smokeless tobacco: Never Used  . Alcohol Use: No    Allergies  Allergen Reactions  . Hydroxacen [Hydroxyzine] Itching and Nausea And Vomiting  . Vicodin [Hydrocodone-Acetaminophen] Itching, Nausea And Vomiting and Other (See Comments)    Headache.    No orders of the defined types were placed in this encounter.    ROS  Per HPI   Objective   There were no vitals taken for this visit.  General: Well appearing, obese female  Assessment and Plan   Please refer to AVS for assessment and plan

## 2014-03-22 ENCOUNTER — Emergency Department (HOSPITAL_COMMUNITY)
Admission: EM | Admit: 2014-03-22 | Discharge: 2014-03-22 | Disposition: A | Discharge status: Home / Self Care | Payer: Medicaid Other | Attending: Emergency Medicine | Admitting: Emergency Medicine

## 2014-03-22 ENCOUNTER — Encounter (HOSPITAL_COMMUNITY): Payer: Self-pay | Admitting: Emergency Medicine

## 2014-03-22 DIAGNOSIS — J302 Other seasonal allergic rhinitis: Secondary | ICD-10-CM

## 2014-03-22 DIAGNOSIS — Z87891 Personal history of nicotine dependence: Secondary | ICD-10-CM | POA: Diagnosis not present

## 2014-03-22 DIAGNOSIS — Z8639 Personal history of other endocrine, nutritional and metabolic disease: Secondary | ICD-10-CM | POA: Insufficient documentation

## 2014-03-22 DIAGNOSIS — Z8659 Personal history of other mental and behavioral disorders: Secondary | ICD-10-CM | POA: Diagnosis not present

## 2014-03-22 DIAGNOSIS — J45909 Unspecified asthma, uncomplicated: Secondary | ICD-10-CM | POA: Diagnosis not present

## 2014-03-22 DIAGNOSIS — Z79899 Other long term (current) drug therapy: Secondary | ICD-10-CM | POA: Diagnosis not present

## 2014-03-22 DIAGNOSIS — J029 Acute pharyngitis, unspecified: Secondary | ICD-10-CM | POA: Diagnosis present

## 2014-03-22 MED ORDER — FLUTICASONE PROPIONATE 50 MCG/ACT NA SUSP: 2.0000 | Freq: Every day | NASAL | Status: DC

## 2014-03-22 MED ORDER — LORATADINE 10 MG PO TABS: 10.0000 mg | ORAL_TABLET | Freq: Every day | ORAL | Status: DC

## 2014-03-22 NOTE — ED Notes (Signed)
Patient experiencing post nasal drip and sore throat for a few days. Painful when swallowing. Hx of same with seasonal changes. Non productive cough present. Denies CP or SOB. Pain is 7/10 has not taken OTC meds b/c she is scared to b/c of her hx of seizures.

## 2014-03-22 NOTE — Discharge Instructions (Signed)

## 2014-03-22 NOTE — ED Provider Notes (Signed)
CSN: [id number]     Arrival date & time 03/22/14  0305 History   First MD Initiated Contact with Patient 03/22/14 [phone]     Chief Complaint  Patient presents with  . Nasal Congestion  . Sore Throat     (Consider location/radiation/quality/duration/timing/severity/associated sxs/prior Treatment) Patient is a 33 y.o. female presenting with pharyngitis. The history is provided by the patient and medical records. No language interpreter was used.  Sore Throat Associated symptoms include congestion, coughing, fatigue and a sore throat. Pertinent negatives include no abdominal pain, arthralgias, chest pain, chills, fever, headaches, myalgias, nausea, numbness, rash or vomiting.    Idamae SchullerDevonda A Grist is a 33 y.o. female  with a hx of asthma and aneurysm, panic attack, seizures presents to the Emergency Department complaining of gradual, persistent, progressively worsening rhinorrhea with associated postnasal drip and sore throat onset 3 days ago. Symptoms are aggravated by swallowing and nothing makes them better. Patient reports she has had the exact same symptoms every time the season changes. She also versus an associated nonproductive cough. She denies fever, chills, headache, neck pain, chest pain, shortness of breath, abdominal pain nausea, vomiting, diarrhea, weakness, dizziness, syncope. Patient has not attempted any over-the-counter medications as she is concerned it will interact with her lamictal and cause of seizures.      Past Medical History  Diagnosis Date  . Asthma   . Aneurysm   . Panic attack   . 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 Aneurysm 2002  . Thyroid disease    Past Surgical History  Procedure Laterality Date  . Brain surgery    . Tonsillectomy    . Aneurysm coiling     Family History  Problem Relation Age of Onset  . Thyroid disease Mother   . Aortic aneurysm Mother     heart  . Diabetes Mother   . Hypertension Maternal Aunt   .  Hypertension Maternal Uncle   . Hypertension Maternal Grandmother   . Diabetes Maternal Grandmother    History  Substance Use Topics  . Smoking status: Former Smoker -- 2.00 packs/day for 18 years    Types: Cigarettes    Quit date: 12/19/2013  . Smokeless tobacco: Never Used  . Alcohol Use: No   OB History   Grav Para Term Preterm Abortions TAB SAB Ect Mult Living   3 3 3       3      Review of Systems  Constitutional: Positive for fatigue. Negative for fever, chills and appetite change.  HENT: Positive for congestion, postnasal drip, rhinorrhea, sinus pressure and sore throat. Negative for ear discharge, ear pain and mouth sores.   Eyes: Negative for visual disturbance.  Respiratory: Positive for cough. Negative for chest tightness, shortness of breath, wheezing and stridor.   Cardiovascular: Negative for chest pain, palpitations and leg swelling.  Gastrointestinal: Negative for nausea, vomiting, abdominal pain and diarrhea.  Genitourinary: Negative for dysuria, urgency, frequency and hematuria.  Musculoskeletal: Negative for arthralgias, back pain, myalgias and neck stiffness.  Skin: Negative for rash.  Neurological: Negative for syncope, light-headedness, numbness and headaches.  Hematological: Negative for adenopathy.  Psychiatric/Behavioral: The patient is not nervous/anxious.   All other systems reviewed and are negative.     Allergies  Hydroxacen and Vicodin  Home Medications   Prior to Admission medications   Medication Sig Start Date End Date Taking? Authorizing Provider  lamoTRIgine (LAMICTAL) 150 MG tablet Take 150-300 mg by mouth 2 (  two) times daily. Takes 150 mg in the morning and 300 mg in the evening   Yes Historical Provider, MD  fluticasone (FLONASE) 50 MCG/ACT nasal spray Place 2 sprays into both nostrils daily. 03/22/14   Lattie Riege, PA-C  loratadine (CLARITIN) 10 MG tablet Take 1 tablet (10 mg total) by mouth daily. One po daily x 5 days 03/22/14    Dahlia Client Makaylia Hewett, PA-C   BP 115/74  Temp(Src) 98.9 F (37.2 C) (Oral)  Resp 18  SpO2 99% Physical Exam  Nursing note and vitals reviewed. Constitutional: She is oriented to person, place, and time. She appears well-developed and well-nourished. No distress.  HENT:  Head: Normocephalic and atraumatic.  Right Ear: Tympanic membrane, external ear and ear canal normal.  Left Ear: Tympanic membrane, external ear and ear canal normal.  Nose: Mucosal edema and rhinorrhea present. No epistaxis. Right sinus exhibits no maxillary sinus tenderness and no frontal sinus tenderness. Left sinus exhibits no maxillary sinus tenderness and no frontal sinus tenderness.  Mouth/Throat: Uvula is midline and mucous membranes are normal. Mucous membranes are not pale and not cyanotic. No oropharyngeal exudate, posterior oropharyngeal edema, posterior oropharyngeal erythema or tonsillar abscesses.  Eyes: Conjunctivae are normal. Pupils are equal, round, and reactive to light.  Neck: Normal range of motion and full passive range of motion without pain.  Cardiovascular: Normal rate and intact distal pulses.   Pulmonary/Chest: Effort normal and breath sounds normal. No stridor.  Clear and equal breath sounds without focal wheezes, rhonchi, rales Equal chest rise  Abdominal: Soft. Bowel sounds are normal. There is no tenderness.  Musculoskeletal: Normal range of motion.  Lymphadenopathy:    She has no cervical adenopathy.  Neurological: She is alert and oriented to person, place, and time.  Skin: Skin is warm and dry. No rash noted. She is not diaphoretic.  Psychiatric: She has a normal mood and affect.    ED Course  Procedures (including critical care time) Labs Review Labs Reviewed - No data to display  Imaging Review No results found.   EKG Interpretation None      MDM   Final diagnoses:  Other seasonal allergic rhinitis   Barbarajean A Steffek presents with allergic rhinitis.  Patient reports  this happens every year around this time. No over-the-counter treatments tried. Patient without signs or symptoms of systemic infection. No tachycardia. Clear and equal breath sounds. No lateralizing sinus congestion and no tenderness to the mastoid or frontal sinuses to suggest bacterial sinusitis. Patient will be given Flonase and Claritin. She is to followup with her primary care physician in 3 days.  BP 115/74  Temp(Src) 98.9 F (37.2 C) (Oral)  Resp 18  SpO2 99%   Dierdre Forth, PA-C 03/22/14 0501

## 2014-03-22 NOTE — ED Provider Notes (Signed)
Medical screening examination/treatment/procedure(s) were performed by non-physician practitioner and as supervising physician I was immediately available for consultation/collaboration.   EKG Interpretation None       Keilana Morlock M Taeshawn Helfman, MD 03/22/14 0603 

## 2014-03-28 IMAGING — US US OB DETAIL+14 WK
1 series · 12 of 28 positions shown · non-contrast
Comparison: none

[Series 1: us ob detail+14 wk · 0.19mm/px · 12 of 90 slices shown]
[im 4/90]
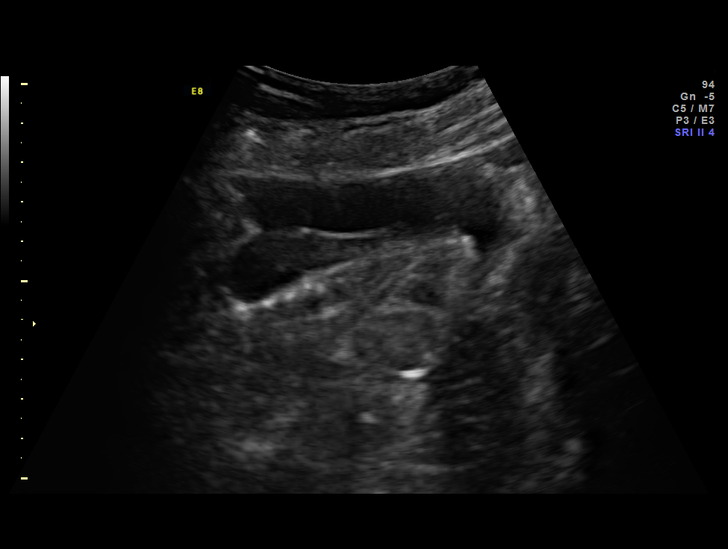
[im 10/90]
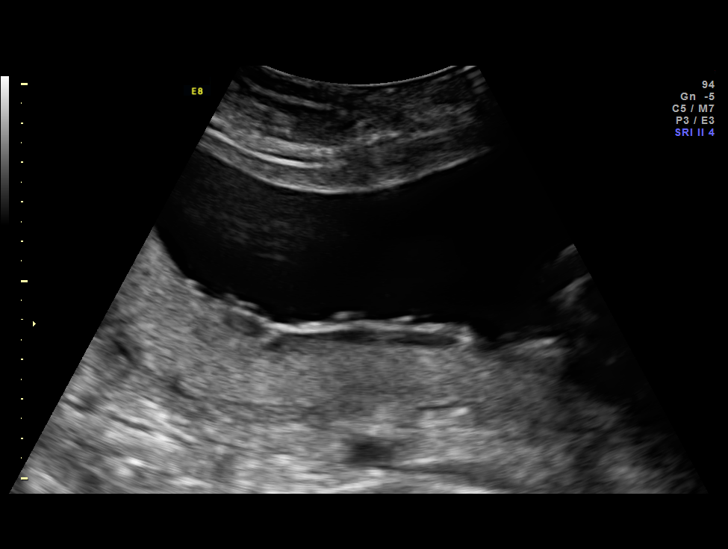
[im 17/90]
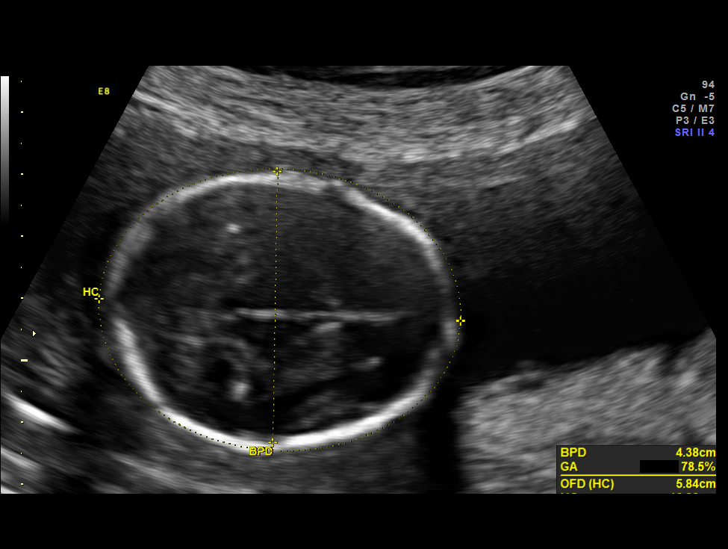
[im 27/90]
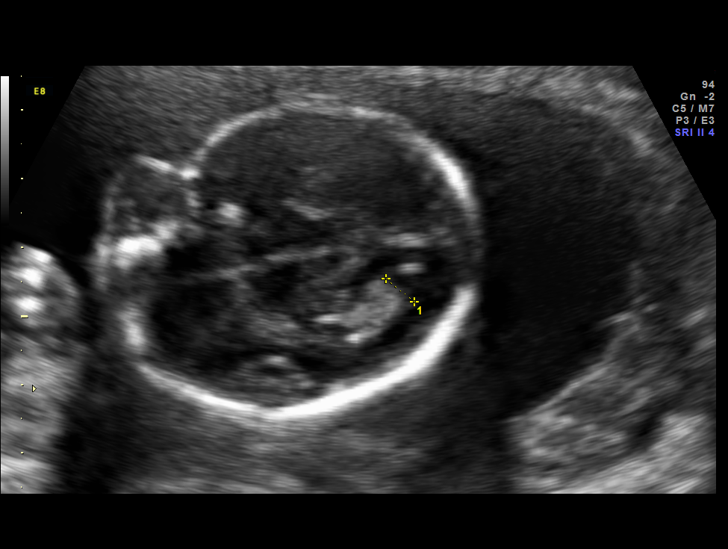
[im 33/90]
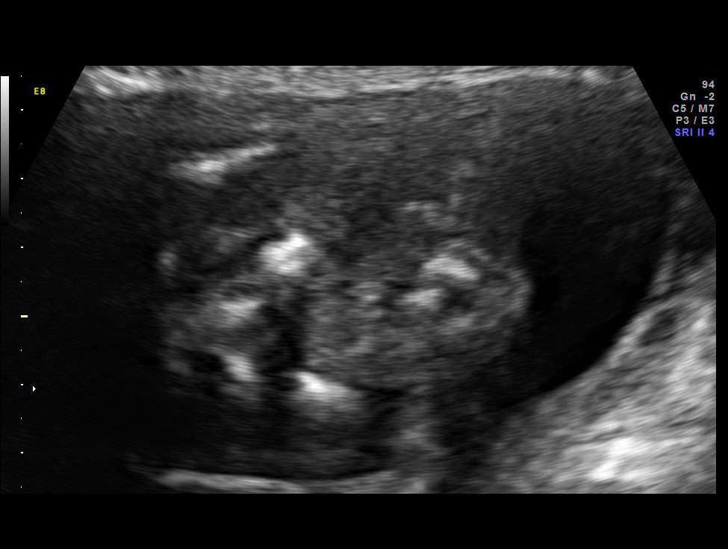
[im 40/90]
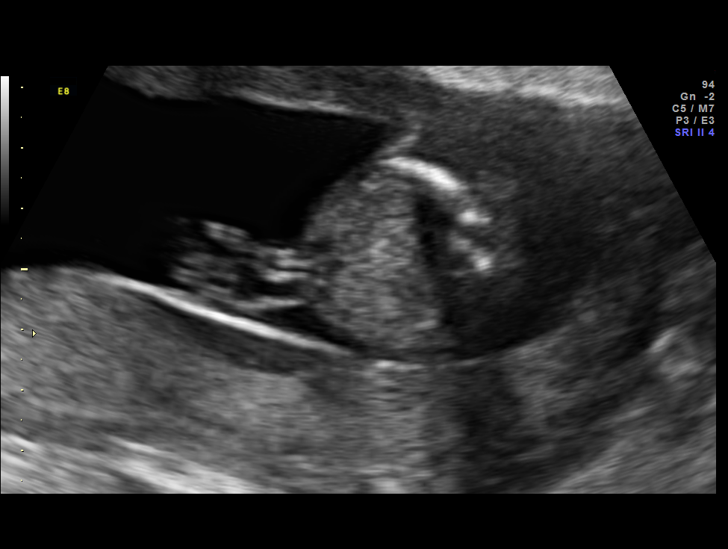
[im 50/90]
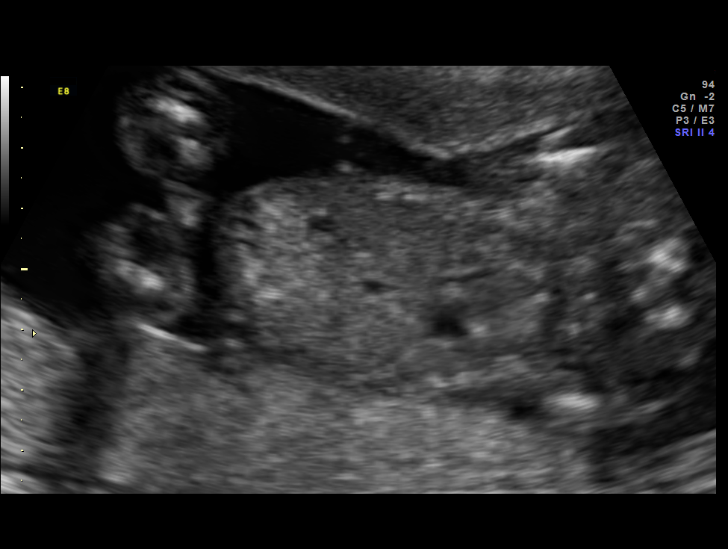
[im 57/90]
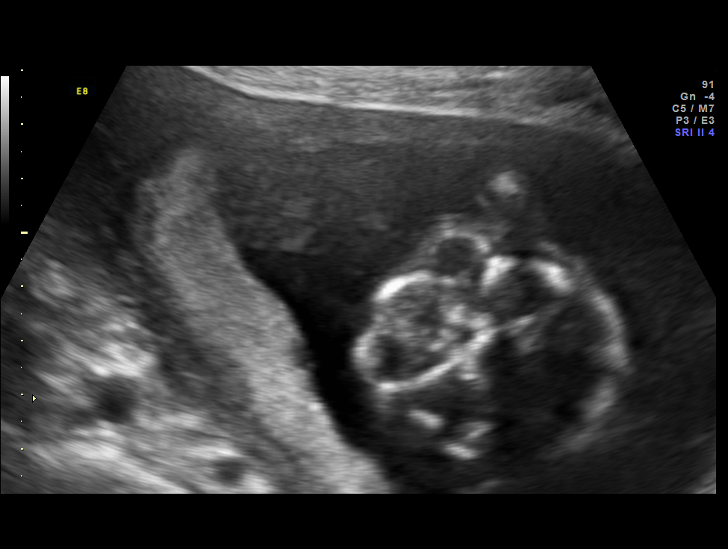
[im 63/90]
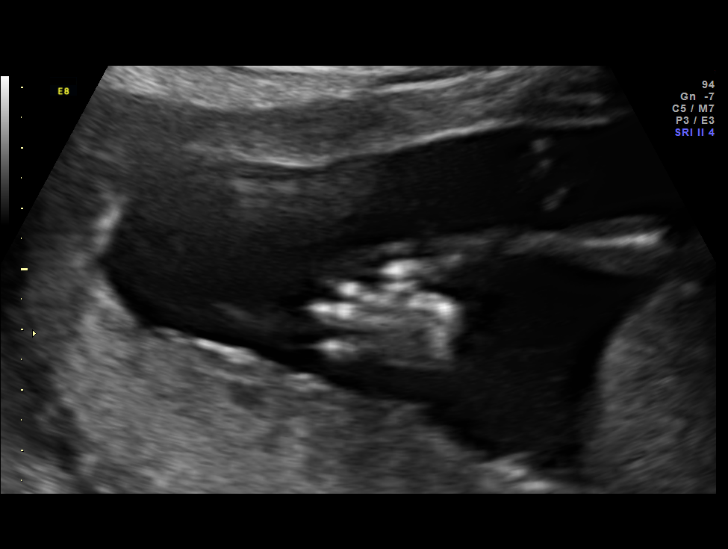
[im 73/90]
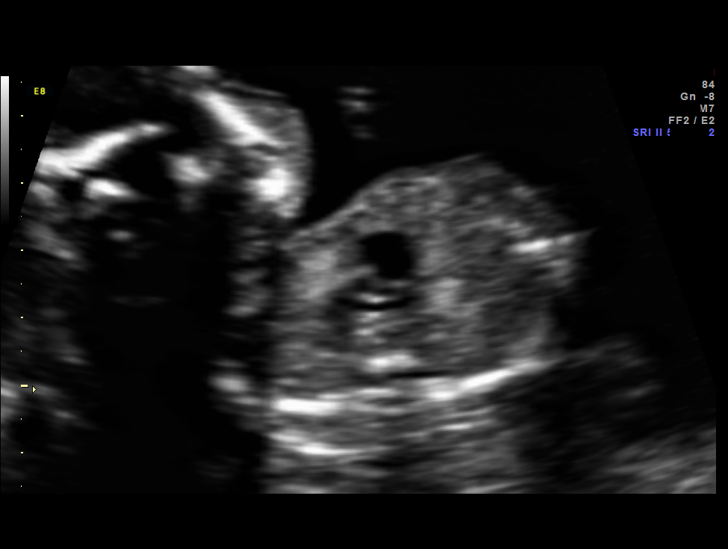
[im 80/90]
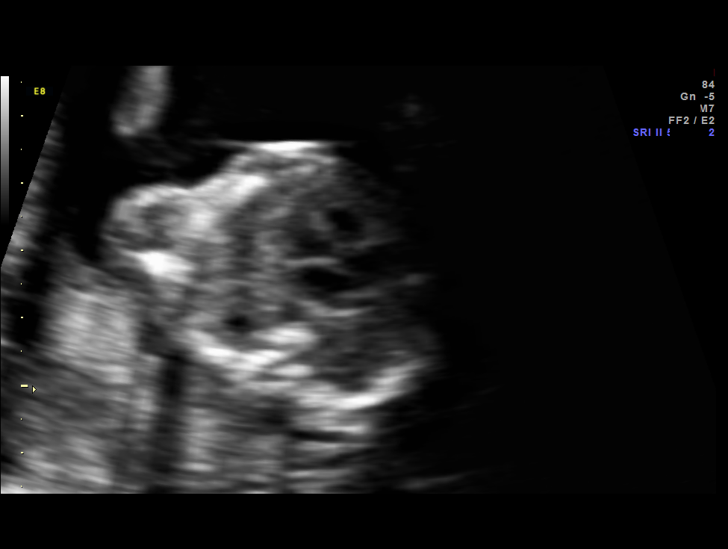
[im 86/90]
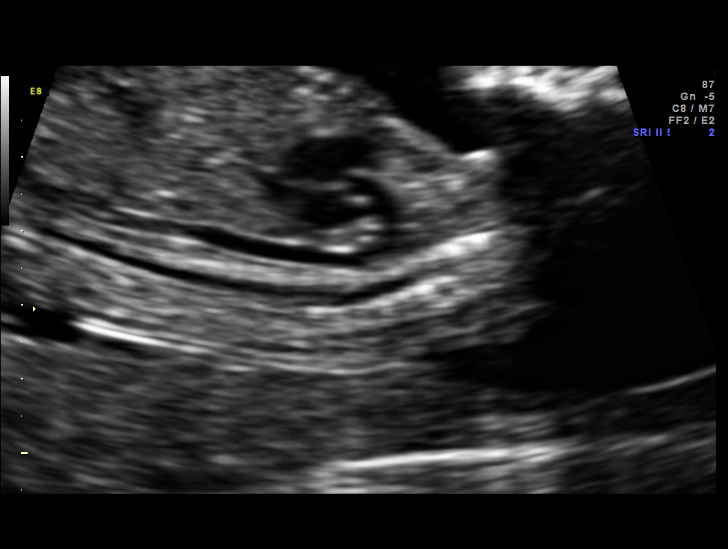

[12 of 28 positions shown; findings below may reference images not displayed]

OBSTETRICS REPORT
                      (Signed Final 07/28/2013 [DATE])

Service(s) Provided

 US OB DETAIL + 14 WK                                  76811.0
Indications

 Detailed fetal anatomic survey
 Previous brain surgery
 Seizure disorder
Fetal Evaluation

 Num Of Fetuses:    1
 Fetal Heart Rate:  136                          bpm
 Cardiac Activity:  Observed
 Presentation:      Cephalic
 Placenta:          Posterior, above cervical
                    os
 P. Cord            Visualized
 Insertion:

 Amniotic Fluid
 AFI FV:      Subjectively within normal limits
                                             Larg Pckt:     4.5  cm
Biometry

 BPD:     43.5  mm     G. Age:  19w 1d                CI:         73.7   70 - 86
 OFD:       59  mm                                    FL/HC:      18.6   15.8 -
                                                                         18
 HC:       164  mm     G. Age:  19w 1d       81  %    HC/AC:      1.14   1.07 -

 AC:     143.9  mm     G. Age:  19w 5d       88  %    FL/BPD:
 FL:      30.5  mm     G. Age:  19w 3d       82  %    FL/AC:      21.2   20 - 24
 HUM:     29.4  mm     G. Age:  19w 4d       90  %
 CER:     20.2  mm     G. Age:  19w 1d       77  %
 NFT:        4  mm

 Est. FW:     298  gm    0 lb 11 oz      67  %
Gestational Age

 LMP:           18w 2d        Date:  03/22/13                 EDD:   12/27/13
 U/S Today:     19w 2d                                        EDD:   12/20/13
 Best:          18w 2d     Det. By:  LMP  (03/22/13)          EDD:   12/27/13
Anatomy

 Cranium:          Appears normal         Aortic Arch:      Appears normal
 Fetal Cavum:      Appears normal         Ductal Arch:      Appears normal
 Ventricles:       Appears normal         Diaphragm:        Appears normal
 Choroid Plexus:   Appears normal         Stomach:          Appears normal, left
                                                            sided
 Cerebellum:       Appears normal         Abdomen:          Appears normal
 Posterior Fossa:  Appears normal         Abdominal Wall:   Appears nml (cord
                                                            insert, abd wall)
 Nuchal Fold:      Appears normal         Cord Vessels:     Appears normal (3
                                                            vessel cord)
 Face:             Appears normal         Kidneys:          Appear normal
                   (orbits and profile)
 Lips:             Appears normal         Bladder:          Appears normal
 Heart:            Appears normal         Spine:            Appears normal
                   (4CH, axis, and
                   situs)
 RVOT:             Appears normal         Lower             Appears normal
                                          Extremities:
 LVOT:             Appears normal         Upper             Appears normal
                                          Extremities:

 Other:  Fetus appears to be a male. Heels and 5th digit appear normal.
Targeted Anatomy

 Fetal Central Nervous System
 Cisterna Magna:
Cervix Uterus Adnexa

 Cervical Length:    4        cm

 Cervix:       Normal appearance by transabdominal scan. Appears
               closed, without funnelling.

 Left Ovary:    Within normal limits.
 Right Ovary:   Within normal limits.
Impression

 An active singleton fetus is observed.
 Biometry is appropriate for gestational age, estimated by
 LMP.
 Amniotic fluid volume is normal.
 Targeted survey of the fetal anatomy was performed. No
 dysmorphic features, and no morphologic "soft markers"
 associated with aneuploidy, are detected. The patient is
 aware that ultrasound cannot definitively exclude aneuploidy.
Recommendations

 Given maternal seizure disorder requiring antiepileptic
 medication, I recommend the following:
 -fetal echocardiogram (scheduled)
 -interval growth q4-6 weeks.

## 2014-04-01 ENCOUNTER — Ambulatory Visit: Payer: Medicaid Other | Admitting: Family Medicine

## 2014-04-14 ENCOUNTER — Emergency Department (HOSPITAL_COMMUNITY)
Admission: EM | Admit: 2014-04-14 | Discharge: 2014-04-14 | Disposition: A | Discharge status: Home / Self Care | Payer: Medicaid Other

## 2014-04-14 ENCOUNTER — Encounter (HOSPITAL_COMMUNITY): Payer: Self-pay | Admitting: Emergency Medicine

## 2014-04-14 ENCOUNTER — Emergency Department (HOSPITAL_COMMUNITY)
Admission: EM | Admit: 2014-04-14 | Discharge: 2014-04-14 | Disposition: A | Discharge status: Home / Self Care | Payer: Medicaid Other | Attending: Emergency Medicine | Admitting: Emergency Medicine

## 2014-04-14 DIAGNOSIS — F410 Panic disorder [episodic paroxysmal anxiety] without agoraphobia: Secondary | ICD-10-CM | POA: Insufficient documentation

## 2014-04-14 DIAGNOSIS — Z7951 Long term (current) use of inhaled steroids: Secondary | ICD-10-CM | POA: Diagnosis not present

## 2014-04-14 DIAGNOSIS — L02419 Cutaneous abscess of limb, unspecified: Secondary | ICD-10-CM

## 2014-04-14 DIAGNOSIS — Z79899 Other long term (current) drug therapy: Secondary | ICD-10-CM | POA: Diagnosis not present

## 2014-04-14 DIAGNOSIS — Z87891 Personal history of nicotine dependence: Secondary | ICD-10-CM | POA: Diagnosis not present

## 2014-04-14 DIAGNOSIS — J329 Chronic sinusitis, unspecified: Secondary | ICD-10-CM | POA: Insufficient documentation

## 2014-04-14 DIAGNOSIS — Z8679 Personal history of other diseases of the circulatory system: Secondary | ICD-10-CM | POA: Insufficient documentation

## 2014-04-14 DIAGNOSIS — L02412 Cutaneous abscess of left axilla: Secondary | ICD-10-CM | POA: Diagnosis present

## 2014-04-14 DIAGNOSIS — B9789 Other viral agents as the cause of diseases classified elsewhere: Secondary | ICD-10-CM

## 2014-04-14 DIAGNOSIS — Z8639 Personal history of other endocrine, nutritional and metabolic disease: Secondary | ICD-10-CM | POA: Insufficient documentation

## 2014-04-14 DIAGNOSIS — G40909 Epilepsy, unspecified, not intractable, without status epilepticus: Secondary | ICD-10-CM | POA: Insufficient documentation

## 2014-04-14 MED ORDER — FLUTICASONE PROPIONATE 50 MCG/ACT NA SUSP: 2.0000 | Freq: Every day | NASAL | Status: DC

## 2014-04-14 MED ORDER — DOXYCYCLINE HYCLATE 100 MG PO CAPS: 100.0000 mg | ORAL_CAPSULE | Freq: Two times a day (BID) | ORAL | Status: DC

## 2014-04-14 NOTE — ED Provider Notes (Signed)
CSN: [id number]     Arrival date & time 04/14/14  2033 History  This chart was scribed for a non-physician practitioner, Terri Piedraourtney Forcucci, PA-C working with Linwood DibblesJon Knapp, MD by [place] Peace, ED Scribe. The patient was seen in [facility]. The patient's care was started at 10:41 PM.     Chief Complaint  Patient presents with  . Abscess      Patient is a 33 y.o. female presenting with abscess. The history is provided by the patient. No language interpreter was used.  Abscess Associated symptoms: headaches    HPI Comments: Hannah Cook is a 33 y.o. female who presents to the Emergency Department complaining of abscess to left axilla onset few days ago with associated swelling and warmth to affected area. She reports history of similar occurrences in the past to same area.   Pt states she also has sinus infection onset this morning complaining of headache, congestion, and sinus pressure. Pt reports history of seizures and states she hasn't taken any medication to address problems because of this issue.    Past Medical History  Diagnosis Date  . Asthma   . Aneurysm   . Panic attack   . 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 Aneurysm 2002  . Thyroid disease    Past Surgical History  Procedure Laterality Date  . Brain surgery    . Tonsillectomy    . Aneurysm coiling     Family History  Problem Relation Age of Onset  . Thyroid disease Mother   . Aortic aneurysm Mother     heart  . Diabetes Mother   . Hypertension Maternal Aunt   . Hypertension Maternal Uncle   . Hypertension Maternal Grandmother   . Diabetes Maternal Grandmother    History  Substance Use Topics  . Smoking status: Former Smoker -- 2.00 packs/day for 18 years    Types: Cigarettes    Quit date: 12/19/2013  . Smokeless tobacco: Never Used  . Alcohol Use: No   OB History   Grav Para Term Preterm Abortions TAB SAB Ect Mult Living   3 3 3       3      Review of Systems  HENT:  Positive for congestion and sinus pressure.   Skin:       Abscess under left axilla.   Neurological: Positive for headaches.      Allergies  Hydroxacen and Vicodin  Home Medications   Prior to Admission medications   Medication Sig Start Date End Date Taking? Authorizing Provider  doxycycline (VIBRAMYCIN) 100 MG capsule Take 1 capsule (100 mg total) by mouth 2 (two) times daily. 04/14/14   Dwan Fennel A Forcucci, PA-C  fluticasone (FLONASE) 50 MCG/ACT nasal spray Place 2 sprays into both nostrils daily. 03/22/14   Hannah Muthersbaugh, PA-C  fluticasone (FLONASE) 50 MCG/ACT nasal spray Place 2 sprays into both nostrils daily. 04/14/14   Keven Soucy A Forcucci, PA-C  lamoTRIgine (LAMICTAL) 150 MG tablet Take 150-300 mg by mouth 2 (two) times daily. Takes 150 mg in the morning and 300 mg in the evening    Historical Provider, MD  loratadine (CLARITIN) 10 MG tablet Take 1 tablet (10 mg total) by mouth daily. One po daily x 5 days 03/22/14   Dahlia ClientHannah Muthersbaugh, PA-C   BP 116/73  Pulse 92  Temp(Src) 98.3 F (36.8 C) (Oral)  Resp 18  Ht 5\' 6"  (1.676 m)  Wt 193 lb (87.544 kg)  BMI 31.17 kg/m2  SpO2 100%  LMP 03/24/2014 Physical Exam  Nursing note and vitals reviewed. Constitutional: She is oriented to person, place, and time. She appears well-developed and well-nourished. No distress.  HENT:  Head: Normocephalic and atraumatic.  Right Ear: Tympanic membrane normal.  Left Ear: Tympanic membrane normal.  Mouth/Throat: Oropharynx is clear and moist.  Mucosal edema. Maxillary and frontal tenderness.    Eyes: Conjunctivae and EOM are normal. Pupils are equal, round, and reactive to light.  Neck: Normal range of motion. Neck supple. No JVD present. No thyromegaly present.  Cardiovascular: Normal rate, regular rhythm, normal heart sounds and intact distal pulses.  Exam reveals no gallop and no friction rub.   No murmur heard. Pulmonary/Chest: Effort normal and breath sounds normal. No  respiratory distress. She has no wheezes. She has no rales. She exhibits no tenderness.  Abdominal: Soft. Bowel sounds are normal.  Musculoskeletal: Normal range of motion.  Lymphadenopathy:    She has no cervical adenopathy.  Neurological: She is alert and oriented to person, place, and time.  Skin: Skin is warm and dry.  Left axilla 1 cm x 0.5 cm hard indurated abscess without surrounding erythema or fluctuance.  There is no active drainage.    Psychiatric: She has a normal mood and affect. Her behavior is normal. Judgment and thought content normal.    ED Course  Procedures (including critical care time) Labs Review Labs Reviewed - No data to display  Results for orders placed in visit on 02/16/14  COMPREHENSIVE METABOLIC PANEL      Result Value Ref Range   Sodium 140  135 - 145 mEq/L   Potassium 3.9  3.5 - 5.3 mEq/L   Chloride 102  96 - 112 mEq/L   CO2 28  19 - 32 mEq/L   Glucose, Bld 71  70 - 99 mg/dL   BUN 13  6 - 23 mg/dL   Creat 1.610.91  0.960.50 - 0.451.10 mg/dL   Total Bilirubin 0.3  0.2 - 1.2 mg/dL   Alkaline Phosphatase 117  39 - 117 U/L   AST 20  0 - 37 U/L   ALT 15  0 - 35 U/L   Total Protein 7.0  6.0 - 8.3 g/dL   Albumin 4.3  3.5 - 5.2 g/dL   Calcium 9.3  8.4 - 40.910.5 mg/dL  LDL CHOLESTEROL, DIRECT      Result Value Ref Range   Direct LDL 118 (*)   TSH      Result Value Ref Range   TSH 0.591  0.350 - 4.500 uIU/mL  POCT GLYCOSYLATED HEMOGLOBIN (HGB A1C)      Result Value Ref Range   Hemoglobin A1C 5.6     No results found.    Imaging Review No results found.   EKG Interpretation None     Medications - No data to display  10:47 PM- Treatment plan was discussed with patient who verbalizes understanding and agrees.   MDM   Final diagnoses:  Axillary abscess  Viral sinusitis   Patient is a 33 y.o. Female who presents with multiple complaints.  Physical exam reveals small abscess under left underarm with minimal fluctuance and no surrounding erythema.   Given history of recurring abscesses and the patient declining I&D will start on doxycycline and have encouraged her to use warm compresses as often as she can tolerate.  Patient also has 1 day of sinus symptoms.  Suspect viral sinusitis. Will discharge home with tylenol and nasal saline.  Patient to return  for worsening fever, chills, nausea, vomiting.  Patient states understanding and agreement.  Patient is stable for discharge.  Patient to follow-up with her PCP in 2 days.   I personally performed the services described in this documentation, which was scribed in my presence. The recorded information has been reviewed and is accurate.   Eben Burow, PA-C 04/14/14 2300

## 2014-04-14 NOTE — ED Notes (Signed)
Pt states she has a boil under her left arm and thinks she may have a sinus infection

## 2014-04-14 NOTE — ED Provider Notes (Signed)
Medical screening examination/treatment/procedure(s) were performed by non-physician practitioner and as supervising physician I was immediately available for consultation/collaboration.    Bostyn Kunkler, MD 04/14/14 2320 

## 2014-04-14 NOTE — Discharge Instructions (Signed)
Abscess An abscess is an infected area that contains a collection of pus and debris.It can occur in almost any part of the body. An abscess is also known as a furuncle or boil. CAUSES  An abscess occurs when tissue gets infected. This can occur from blockage of oil or sweat glands, infection of hair follicles, or a minor injury to the skin. As the body tries to fight the infection, pus collects in the area and creates pressure under the skin. This pressure causes pain. People with weakened immune systems have difficulty fighting infections and get certain abscesses more often.  SYMPTOMS Usually an abscess develops on the skin and becomes a painful mass that is red, warm, and tender. If the abscess forms under the skin, you may feel a moveable soft area under the skin. Some abscesses break open (rupture) on their own, but most will continue to get worse without care. The infection can spread deeper into the body and eventually into the bloodstream, causing you to feel ill.  DIAGNOSIS  Your caregiver will take your medical history and perform a physical exam. A sample of fluid may also be taken from the abscess to determine what is causing your infection. TREATMENT  Your caregiver may prescribe antibiotic medicines to fight the infection. However, taking antibiotics alone usually does not cure an abscess. Your caregiver may need to make a small cut (incision) in the abscess to drain the pus. In some cases, gauze is packed into the abscess to reduce pain and to continue draining the area. HOME CARE INSTRUCTIONS   Only take over-the-counter or prescription medicines for pain, discomfort, or fever as directed by your caregiver.  If you were prescribed antibiotics, take them as directed. Finish them even if you start to feel better.  If gauze is used, follow your caregiver's directions for changing the gauze.  To avoid spreading the infection:  Keep your draining abscess covered with a  bandage.  Wash your hands well.  Do not share personal care items, towels, or whirlpools with others.  Avoid skin contact with others.  Keep your skin and clothes clean around the abscess.  Keep all follow-up appointments as directed by your caregiver. SEEK MEDICAL CARE IF:   You have increased pain, swelling, redness, fluid drainage, or bleeding.  You have muscle aches, chills, or a general ill feeling.  You have a fever. MAKE SURE YOU:   Understand these instructions.  Will watch your condition.  Will get help right away if you are not doing well or get worse. Document Released: 03/14/2005 Document Revised: 12/04/2011 Document Reviewed: 08/17/2011 [facility] Patient Information 2015 [employer], [place]. This information is not intended to replace advice given to you by your health care provider. Make sure you discuss any questions you have with your health care provider. Sinusitis Sinusitis is redness, soreness, and inflammation of the paranasal sinuses. Paranasal sinuses are air pockets within the bones of your face (beneath the eyes, the middle of the forehead, or above the eyes). In healthy paranasal sinuses, mucus is able to drain out, and air is able to circulate through them by way of your nose. However, when your paranasal sinuses are inflamed, mucus and air can become trapped. This can allow bacteria and other germs to grow and cause infection. Sinusitis can develop quickly and last only a short time (acute) or continue over a long period (chronic). Sinusitis that lasts for more than 12 weeks is considered chronic.  CAUSES  Causes of sinusitis include:  Allergies.  Structural abnormalities, such as displacement of the cartilage that separates your nostrils (deviated septum), which can decrease the air flow through your nose and sinuses and affect sinus drainage.  Functional abnormalities, such as when the small hairs (cilia) that line your sinuses and help remove mucus do not  work properly or are not present. SIGNS AND SYMPTOMS  Symptoms of acute and chronic sinusitis are the same. The primary symptoms are pain and pressure around the affected sinuses. Other symptoms include:  Upper toothache.  Earache.  Headache.  Bad breath.  Decreased sense of smell and taste.  A cough, which worsens when you are lying flat.  Fatigue.  Fever.  Thick drainage from your nose, which often is green and may contain pus (purulent).  Swelling and warmth over the affected sinuses. DIAGNOSIS  Your health care provider will perform a physical exam. During the exam, your health care provider may:  Look in your nose for signs of abnormal growths in your nostrils (nasal polyps).  Tap over the affected sinus to check for signs of infection.  View the inside of your sinuses (endoscopy) using an imaging device that has a light attached (endoscope). If your health care provider suspects that you have chronic sinusitis, one or more of the following tests may be recommended:  Allergy tests.  Nasal culture. A sample of mucus is taken from your nose, sent to a lab, and screened for bacteria.  Nasal cytology. A sample of mucus is taken from your nose and examined by your health care provider to determine if your sinusitis is related to an allergy. TREATMENT  Most cases of acute sinusitis are related to a viral infection and will resolve on their own within 10 days. Sometimes medicines are prescribed to help relieve symptoms (pain medicine, decongestants, nasal steroid sprays, or saline sprays).  However, for sinusitis related to a bacterial infection, your health care provider will prescribe antibiotic medicines. These are medicines that will help kill the bacteria causing the infection.  Rarely, sinusitis is caused by a fungal infection. In theses cases, your health care provider will prescribe antifungal medicine. For some cases of chronic sinusitis, surgery is needed. Generally,  these are cases in which sinusitis recurs more than 3 times per year, despite other treatments. HOME CARE INSTRUCTIONS   Drink plenty of water. Water helps thin the mucus so your sinuses can drain more easily.  Use a humidifier.  Inhale steam 3 to 4 times a day (for example, sit in the bathroom with the shower running).  Apply a warm, moist washcloth to your face 3 to 4 times a day, or as directed by your health care provider.  Use saline nasal sprays to help moisten and clean your sinuses.  Take medicines only as directed by your health care provider.  If you were prescribed either an antibiotic or antifungal medicine, finish it all even if you start to feel better. SEEK IMMEDIATE MEDICAL CARE IF:  You have increasing pain or severe headaches.  You have nausea, vomiting, or drowsiness.  You have swelling around your face.  You have vision problems.  You have a stiff neck.  You have difficulty breathing. MAKE SURE YOU:   Understand these instructions.  Will watch your condition.  Will get help right away if you are not doing well or get worse. Document Released: 06/04/2005 Document Revised: 10/19/2013 Document Reviewed: 06/19/2011 [facility]ExitCare Patient Information 2015 [place], [place]. This information is not intended to replace advice given to you by your health  care provider. Make sure you discuss any questions you have with your health care provider.

## 2014-04-19 ENCOUNTER — Encounter (HOSPITAL_COMMUNITY): Payer: Self-pay | Admitting: Emergency Medicine

## 2014-07-10 ENCOUNTER — Encounter (HOSPITAL_COMMUNITY): Payer: Self-pay

## 2014-07-10 ENCOUNTER — Inpatient Hospital Stay (HOSPITAL_COMMUNITY)
Admission: AD | Admit: 2014-07-10 | Discharge: 2014-07-10 | Disposition: A | Discharge status: Home / Self Care | Payer: Medicaid Other | Source: Ambulatory Visit | Attending: Obstetrics and Gynecology | Admitting: Obstetrics and Gynecology

## 2014-07-10 DIAGNOSIS — O200 Threatened abortion: Secondary | ICD-10-CM

## 2014-07-10 DIAGNOSIS — Z87891 Personal history of nicotine dependence: Secondary | ICD-10-CM | POA: Diagnosis not present

## 2014-07-10 DIAGNOSIS — O209 Hemorrhage in early pregnancy, unspecified: Secondary | ICD-10-CM

## 2014-07-10 DIAGNOSIS — O4691 Antepartum hemorrhage, unspecified, first trimester: Secondary | ICD-10-CM

## 2014-07-10 DIAGNOSIS — Z3A00 Weeks of gestation of pregnancy not specified: Secondary | ICD-10-CM | POA: Diagnosis not present

## 2014-07-10 LAB — URINE MICROSCOPIC-ADD ON

## 2014-07-10 LAB — CBC
HCT: 37.1 % (ref 36.0–46.0)
Hemoglobin: 12.9 g/dL (ref 12.0–15.0)
MCH: 31.7 pg (ref 26.0–34.0)
MCHC: 34.8 g/dL (ref 30.0–36.0)
MCV: 91.2 fL (ref 78.0–100.0)
Platelets: 254 10*3/uL (ref 150–400)
RBC: 4.07 MIL/uL (ref 3.87–5.11)
RDW: 13.3 % (ref 11.5–15.5)
WBC: 4.5 10*3/uL (ref 4.0–10.5)

## 2014-07-10 LAB — URINALYSIS, ROUTINE W REFLEX MICROSCOPIC
Bilirubin Urine: NEGATIVE
Glucose, UA: NEGATIVE mg/dL
KETONES UR: NEGATIVE mg/dL
LEUKOCYTES UA: NEGATIVE
Nitrite: NEGATIVE
PH: 6 (ref 5.0–8.0)
PROTEIN: NEGATIVE mg/dL
Specific Gravity, Urine: 1.025 (ref 1.005–1.030)
UROBILINOGEN UA: 0.2 mg/dL (ref 0.0–1.0)

## 2014-07-10 LAB — WET PREP, GENITAL
CLUE CELLS WET PREP: NONE SEEN
TRICH WET PREP: NONE SEEN
Yeast Wet Prep HPF POC: NONE SEEN

## 2014-07-10 LAB — HCG, QUANTITATIVE, PREGNANCY: hCG, Beta Chain, Quant, S: 10 m[IU]/mL — ABNORMAL HIGH (ref <5)

## 2014-07-10 NOTE — MAU Note (Signed)
Pt states LMP 2 months ago. Pt states bleeding heavily now. Has changed pad/tampon twice today. Saw blood clots last pm. Has had 2 positive upt's at home. Pain in bilateral lower abdomen.

## 2014-07-10 NOTE — MAU Note (Signed)
Pt unable to sign esignature pad. Computer froze and prevented printing. Discharge instructions reviewed and pt verbalized understanding. Ambulated in stable condition to private vehicle with significant other.

## 2014-07-10 NOTE — Discharge Instructions (Signed)

## 2014-07-10 NOTE — MAU Provider Note (Signed)
Chief Complaint: Vaginal Bleeding   First Provider Initiated Contact with Patient 07/10/14 1426     SUBJECTIVE HPI: Hannah Cook is a 34 y.o. G3P3003 at Unknown by LMP who presents to maternity admissions reporting bright red vaginal bleeding, like menstrual-bleeding with onset last night.  She reports spotting 3 days ago.  She had positive home pregnancy tests x 2, the first 2 weeks ago, the last 3 days ago, with the onset of spotting.  She denies abdominal pain, vaginal itching/burning, urinary symptoms, h/a, dizziness, n/v, or fever/chills.     Past Medical History  Diagnosis Date  . Asthma   . Aneurysm   . Panic attack   . 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 Aneurysm 2002  . Thyroid disease    Past Surgical History  Procedure Laterality Date  . Brain surgery    . Tonsillectomy    . Aneurysm coiling     History   Social History  . Marital Status: Single    Spouse Name: N/A    Number of Children: N/A  . Years of Education: N/A   Occupational History  . Not on file.   Social History Main Topics  . Smoking status: Former Smoker -- 2.00 packs/day for 18 years    Types: Cigarettes    Quit date: 12/19/2013  . Smokeless tobacco: Never Used  . Alcohol Use: No  . Drug Use: No  . Sexual Activity: Yes    Birth Control/ Protection: Condom     Comment: With men only   Other Topics Concern  . Not on file   Social History Narrative   No current facility-administered medications on file prior to encounter.   Current Outpatient Prescriptions on File Prior to Encounter  Medication Sig Dispense Refill  . doxycycline (VIBRAMYCIN) 100 MG capsule Take 1 capsule (100 mg total) by mouth 2 (two) times daily. (Patient not taking: Reported on 07/10/2014) 20 capsule 0  . fluticasone (FLONASE) 50 MCG/ACT nasal spray Place 2 sprays into both nostrils daily. (Patient not taking: Reported on 07/10/2014) 9.9 g 2  . fluticasone (FLONASE) 50 MCG/ACT nasal spray  Place 2 sprays into both nostrils daily. (Patient not taking: Reported on 07/10/2014) 16 g 0  . lamoTRIgine (LAMICTAL) 150 MG tablet Take 150-300 mg by mouth 2 (two) times daily. Takes 150 mg in the morning and 300 mg in the evening    . loratadine (CLARITIN) 10 MG tablet Take 1 tablet (10 mg total) by mouth daily. One po daily x 5 days (Patient not taking: Reported on 07/10/2014) 5 tablet 0  . [DISCONTINUED] topiramate (TOPAMAX) 25 MG tablet Take 25 mg by mouth 2 (two) times daily.     Allergies  Allergen Reactions  . Hydroxacen [Hydroxyzine] Itching and Nausea And Vomiting  . Vicodin [Hydrocodone-Acetaminophen] Itching, Nausea And Vomiting and Other (See Comments)    Headache.    ROS: Pertinent items in HPI  OBJECTIVE Blood pressure 116/75, pulse 76, temperature 97.7 F (36.5 C), temperature source Oral, resp. rate 16, height  (1.676 m), weight 88.678 kg (195 lb 8 oz), last menstrual period 05/15/2014, SpO2 100 %, not currently breastfeeding. GENERAL: Well-developed, well-nourished female in no acute distress.  HEENT: Normocephalic HEART: normal rate RESP: normal effort ABDOMEN: Soft, non-tender EXTREMITIES: Nontender, no edema NEURO: Alert and oriented Pelvic exam: Cervix pink, visually closed, without lesion, small amount dark red bleeding, vaginal walls and external genitalia normal Bimanual exam: Cervix 0/long/high, firm, anterior,  neg CMT, uterus nontender, nonenlarged, adnexa without tenderness, enlargement, or mass  LAB RESULTS Results for orders placed or performed during the hospital encounter of 07/10/14 (from the past 24 hour(s))  Urinalysis, Routine w reflex microscopic     Status: Abnormal   Collection Time: 07/10/14  1:23 PM  Result Value Ref Range   Color, Urine YELLOW YELLOW   APPearance CLEAR CLEAR   Specific Gravity, Urine 1.025 1.005 - 1.030   pH 6.0 5.0 - 8.0   Glucose, UA NEGATIVE NEGATIVE mg/dL   Hgb urine dipstick LARGE (A) NEGATIVE   Bilirubin Urine  NEGATIVE NEGATIVE   Ketones, ur NEGATIVE NEGATIVE mg/dL   Protein, ur NEGATIVE NEGATIVE mg/dL   Urobilinogen, UA 0.2 0.0 - 1.0 mg/dL   Nitrite NEGATIVE NEGATIVE   Leukocytes, UA NEGATIVE NEGATIVE  Urine microscopic-add on     Status: None   Collection Time: 07/10/14  1:23 PM  Result Value Ref Range   Squamous Epithelial / LPF RARE RARE   WBC, UA 0-2 <3 WBC/hpf   RBC / HPF 21-50 <3 RBC/hpf   Urine-Other MUCOUS PRESENT   hCG, quantitative, pregnancy     Status: Abnormal   Collection Time: 07/10/14  1:40 PM  Result Value Ref Range   hCG, Beta Chain, Quant, S 10 (H) <5 mIU/mL  CBC     Status: None   Collection Time: 07/10/14  1:40 PM  Result Value Ref Range   WBC 4.5 4.0 - 10.5 K/uL   RBC 4.07 3.87 - 5.11 MIL/uL   Hemoglobin 12.9 12.0 - 15.0 g/dL   HCT 16.1 09.6 - 04.5 %   MCV 91.2 78.0 - 100.0 fL   MCH 31.7 26.0 - 34.0 pg   MCHC 34.8 30.0 - 36.0 g/dL   RDW 40.9 81.1 - 91.4 %   Platelets 254 150 - 400 K/uL  Wet prep, genital     Status: Abnormal   Collection Time: 07/10/14  2:40 PM  Result Value Ref Range   Yeast Wet Prep HPF POC NONE SEEN NONE SEEN   Trich, Wet Prep NONE SEEN NONE SEEN   Clue Cells Wet Prep HPF POC NONE SEEN NONE SEEN   WBC, Wet Prep HPF POC FEW (A) NONE SEEN    IMAGING No results found.  ASSESSMENT 1. Vaginal bleeding in pregnancy, first trimester   2. Threatened miscarriage in early pregnancy     PLAN Consult Dr Emelda Fear Discharge home F/U in [facility] on Monday for repeat quant hcg Ectopic and bleeding precautions given Return to MAU as needed for emergencies    Medication List    STOP taking these medications        doxycycline 100 MG capsule  Commonly known as:  VIBRAMYCIN     fluticasone 50 MCG/ACT nasal spray  Commonly known as:  FLONASE     loratadine 10 MG tablet  Commonly known as:  CLARITIN      TAKE these medications        lamoTRIgine 100 MG tablet  Commonly known as:  LAMICTAL  Take 100 mg by mouth 2 (two) times daily.      lamoTRIgine 150 MG tablet  Commonly known as:  LAMICTAL  Take 150-300 mg by mouth 2 (two) times daily. Takes 150 mg in the morning and 300 mg in the evening       Follow-up Information    Follow up with [facility].   Specialty:  Obstetrics and Gynecology   Why:  On Monday morning  anytime between 8 am and 11 am for repeat lab.  , As scheduled   Contact information:   [address] [place] [id number] [phone]      [facility] with [facility].   Why:  As needed for emergencies   Contact information:   [address] [id number] mc [place] [id number] [phone]      Sharen CounterLisa Leftwich-Kirby Certified Nurse-Midwife 07/10/2014  3:16 PM

## 2014-07-12 ENCOUNTER — Other Ambulatory Visit: Payer: Medicaid Other

## 2014-07-12 ENCOUNTER — Encounter: Payer: Self-pay | Admitting: Obstetrics & Gynecology

## 2014-07-12 ENCOUNTER — Telehealth: Payer: Self-pay | Admitting: Obstetrics & Gynecology

## 2014-07-12 LAB — GC/CHLAMYDIA PROBE AMP (~~LOC~~) NOT AT ARMC
Chlamydia: NEGATIVE
Neisseria Gonorrhea: NEGATIVE

## 2014-07-12 NOTE — Telephone Encounter (Signed)
Called Patient left message for her to call clinic back. Mailing certified letter to patient.

## 2014-07-15 ENCOUNTER — Telehealth: Payer: Self-pay | Admitting: *Deleted

## 2014-07-15 ENCOUNTER — Other Ambulatory Visit: Payer: Medicaid Other

## 2014-07-15 DIAGNOSIS — O209 Hemorrhage in early pregnancy, unspecified: Secondary | ICD-10-CM

## 2014-07-15 LAB — HCG, QUANTITATIVE, PREGNANCY: HCG, BETA CHAIN, QUANT, S: 1 m[IU]/mL

## 2014-07-15 NOTE — Progress Notes (Unsigned)
Pt in for repeat beta hcg today. She denies any pain or bleeding. Advised patient that we would have labs back around lunch time and will let her know the results and any followup if needed.

## 2014-07-15 NOTE — Telephone Encounter (Signed)
Contacted patient, pt notified that hormone level is back to normal, needs to wait 3 cycles for attempting to conceive again. Pt verbalizes understanding.

## 2014-07-15 NOTE — Telephone Encounter (Signed)
Hannah Cook had a early miscarriage and came today for a follow up bhcg. Result =1. Reported to Rochele PagesWalidah Karim, CNM and chart reviewed. Called [name] and advised her bhcg returned to normal, pregnancy resolved. Call if any problems, or go to MAU. She denies any bleeding or issues and voices understanding.

## 2014-08-17 ENCOUNTER — Encounter: Payer: Self-pay | Admitting: General Practice

## 2014-10-10 ENCOUNTER — Inpatient Hospital Stay (HOSPITAL_COMMUNITY)
Admission: AD | Admit: 2014-10-10 | Discharge: 2014-10-10 | Disposition: A | Discharge status: Home / Self Care | Payer: Medicaid Other | Source: Ambulatory Visit | Attending: Obstetrics & Gynecology | Admitting: Obstetrics & Gynecology

## 2014-10-10 ENCOUNTER — Encounter (HOSPITAL_COMMUNITY): Payer: Self-pay | Admitting: *Deleted

## 2014-10-10 DIAGNOSIS — Z3A11 11 weeks gestation of pregnancy: Secondary | ICD-10-CM | POA: Diagnosis not present

## 2014-10-10 DIAGNOSIS — Z87891 Personal history of nicotine dependence: Secondary | ICD-10-CM | POA: Diagnosis not present

## 2014-10-10 DIAGNOSIS — G40909 Epilepsy, unspecified, not intractable, without status epilepticus: Secondary | ICD-10-CM

## 2014-10-10 DIAGNOSIS — O9989 Other specified diseases and conditions complicating pregnancy, childbirth and the puerperium: Secondary | ICD-10-CM | POA: Diagnosis not present

## 2014-10-10 DIAGNOSIS — R05 Cough: Secondary | ICD-10-CM | POA: Diagnosis present

## 2014-10-10 DIAGNOSIS — O99353 Diseases of the nervous system complicating pregnancy, third trimester: Secondary | ICD-10-CM

## 2014-10-10 DIAGNOSIS — J3489 Other specified disorders of nose and nasal sinuses: Secondary | ICD-10-CM | POA: Diagnosis not present

## 2014-10-10 LAB — URINALYSIS, ROUTINE W REFLEX MICROSCOPIC
Bilirubin Urine: NEGATIVE
GLUCOSE, UA: NEGATIVE mg/dL
Hgb urine dipstick: NEGATIVE
KETONES UR: 15 mg/dL — AB
Leukocytes, UA: NEGATIVE
Nitrite: NEGATIVE
PH: 6.5 (ref 5.0–8.0)
PROTEIN: NEGATIVE mg/dL
Specific Gravity, Urine: 1.025 (ref 1.005–1.030)
Urobilinogen, UA: 0.2 mg/dL (ref 0.0–1.0)

## 2014-10-10 LAB — POCT PREGNANCY, URINE: PREG TEST UR: POSITIVE — AB

## 2014-10-10 MED ORDER — AMOXICILLIN 500 MG PO CAPS: 500.0000 mg | ORAL_CAPSULE | Freq: Three times a day (TID) | ORAL | Status: DC

## 2014-10-10 NOTE — MAU Provider Note (Signed)
History     CSN: [id number]  Arrival date and time: 10/10/14 2111   First Provider Initiated Contact with Patient 10/10/14 2145      Chief Complaint  Patient presents with  . Possible Pregnancy  . Fever  . Cough   HPI  Hannah Cook is a 34 y.o. [id number] at 9113w2d here with report of fever, cough, and sore throat x 3 days.  No recent exposure to ill individuals.  Feels similar symptoms prior to sinus infection.  [facility]  No arrangements have been made to get into the clinic.    Past Medical History  Diagnosis Date  . Asthma   . Aneurysm   . Panic attack   . 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 Aneurysm 2002  . Thyroid disease     Past Surgical History  Procedure Laterality Date  . Brain surgery    . Tonsillectomy    . Aneurysm coiling      Family History  Problem Relation Age of Onset  . Thyroid disease Mother   . Aortic aneurysm Mother     heart  . Diabetes Mother   . Hypertension Maternal Aunt   . Hypertension Maternal Uncle   . Hypertension Maternal Grandmother   . Diabetes Maternal Grandmother     History  Substance Use Topics  . Smoking status: Former Smoker -- 2.00 packs/day for 18 years    Types: Cigarettes    Quit date: 12/19/2013  . Smokeless tobacco: Never Used  . Alcohol Use: No    Allergies:  Allergies  Allergen Reactions  . Hydroxacen [Hydroxyzine] Itching and Nausea And Vomiting  . Vicodin [Hydrocodone-Acetaminophen] Itching, Nausea And Vomiting and Other (See Comments)    Headache.    Prescriptions prior to admission  Medication Sig Dispense Refill Last Dose  . lamoTRIgine (LAMICTAL) 100 MG tablet Take 100 mg by mouth 2 (two) times daily.   07/09/2014 at Unknown time  . lamoTRIgine (LAMICTAL) 150 MG tablet Take 150-300 mg by mouth 2 (two) times daily. Takes 150 mg in the morning and 300 mg in the evening   Not Taking at Unknown time    Review of Systems   Constitutional: Positive for fever and chills.  HENT: Positive for congestion and sore throat.   Respiratory: Positive for cough and sputum production. Negative for wheezing.   Gastrointestinal: Negative for nausea, vomiting, abdominal pain, diarrhea and constipation.  All other systems reviewed and are negative.  Physical Exam   Blood pressure 124/68, pulse 103, temperature 98.3 F (36.8 C), temperature source Oral, resp. rate 18, height 5\' 6"  (1.676 m), weight 92.08 kg (203 lb), last menstrual period 07/23/2014, SpO2 99 %, not currently breastfeeding.  Physical Exam  Constitutional: She is oriented to person, place, and time. She appears well-developed and well-nourished. No distress.  HENT:  Head: Normocephalic.  Nose: Right sinus exhibits maxillary sinus tenderness. Right sinus exhibits no frontal sinus tenderness. Left sinus exhibits maxillary sinus tenderness. Left sinus exhibits no frontal sinus tenderness.  Mouth/Throat: Mucous membranes are not dry. No oropharyngeal exudate or posterior oropharyngeal edema.  Neck: Normal range of motion. Neck supple.  Cardiovascular: Normal rate, regular rhythm and normal heart sounds.   Respiratory: Effort normal and breath sounds normal. No respiratory distress.  Musculoskeletal: Normal range of motion. She exhibits no edema.  Neurological: She is alert and oriented to person, place, and time. She has normal reflexes.  Skin: Skin is warm and dry.    MAU Course  Procedures   Assessment and Plan  34 y.o. [id number] at [redacted]w[redacted]d IUP Sinus Pain Hx of aneurysm  Plan: RX Amoxicillin 500 mg TID x 7 days Schedule appointment in [facility] Order First Screen due to patient gestational age and desire  Marlis Edelson 10/10/2014, 9:47 PM

## 2014-10-10 NOTE — Discharge Instructions (Signed)
Pregnancy and Epilepsy °Epilepsy is a lifelong (chronic) disease that causes repeated seizures. A brain dysfunction makes nerve cells produce too many electrical discharges, causing the seizures. These discharges are unpredictable and can make the body shake uncontrollably.  °There is no way to tell whether pregnancy will make your epilepsy worse. With the right care, however, chances are good you will have a normal, healthy baby. If you are planning to get pregnant, work with your health care provider and go to all your prenatal appointments.  °WHAT ARE THE RISKS? °Most women with epilepsy have safe pregnancies and healthy babies, but having the condition does mean there are some additional risks. These include: °· Uncontrolled nausea and vomiting that does not go away (hyperemesis gravidarum). °· Vaginal bleeding. °· Anemia. °· Endocrine hormone disorders. °· Premature labor. °· Failure to progress in labor. °· Increased chance of cesarean delivery. °· A slightly increased risk for birth defects.    °WHAT IF I HAVE A SEIZURE WHILE I AM PREGNANT? °A seizure during pregnancy can reduce blood supply to your baby and may increase your risk for early labor. You will likely have fewer or the same number of seizures during pregnancy. However, there is a small chance that you will have more seizures. This may be from hormonal changes, stress, or a lower dose of seizure medicine.  °To prevent seizures, your health care provider will do frequent blood tests to make sure your antiepileptic drug is at a safe level. °WHAT ARE THE TREATMENT OPTIONS FOR EPILEPSY WHILE I AM PREGNANT? °Your health care provider can answer questions about your epilepsy treatment if you are pregnant or planning a pregnancy. °· Follow your health care provider's treatment advice. °· Take your antiepileptic drug as directed by your health care provider. °· You may have to take higher-than-normal doses of folic acid. This is to help protect your baby  from spinal cord defects. °· Keep all your appointments for prenatal care and checkups for epilepsy. °WILL MY MEDICINE AFFECT MY BABY? °To protect your baby, your health care provider will prescribe the safest antiepileptic drug at the lowest dose that will still prevent seizures. Your baby will also be checked often to make sure everything is okay.  °Antiepileptic drugs are used during pregnancy because the risk of a seizure harming your baby is higher than the risk of the medicine harming your baby. If you have not had any seizures for several years, you may not need to take an antiepileptic drug.  °WILL I BE ABLE TO BREASTFEED MY BABY? °Women with epilepsy are encouraged to breastfeed. Your antiepileptic drug may pass through your breast milk in small amounts, but it is usually not enough to affect your baby. Take your antiepileptic drug after you breastfeed your baby, not before. °WHEN SHOULD I SEEK MEDICAL CARE?  °If you are pregnant and have epilepsy, you should contact your health care provider if: °· You think you are having minor or isolated seizures. °· You do not feel the baby moving as much as usual. °· You develop tiredness or weakness, or you feel faint.   °WHEN SHOULD I SEEK IMMEDIATE MEDICAL CARE? °You should seek emergency care right away if: °· You have very bad abdominal pain. °· You have vaginal bleeding, with or without abdominal pain. °· You do not feel the baby moving. °· You have very bad headaches. °· You have vision problems. °· You have numbness on your body. °· You cannot stop vomiting. °Document Released: 06/01/2000 Document Revised: 10/19/2013 Document Reviewed: 05/08/2013 °  Exit[employer]® Patient Information ©2015 [employer]. This information is not intended to replace advice given to you by your health care provider. Make sure you discuss any questions you have with your health care provider. ° °

## 2014-10-10 NOTE — MAU Note (Signed)
+  HPT 1 month ago;LMP 07/23/14.  c/o cough and sore throat x 3 days; reports fever--did not take temperature at home; reports chills. Denies N/V. Denies VB or discharge. Has not taken any OTC yet for s/s.

## 2014-10-11 ENCOUNTER — Encounter: Payer: Self-pay | Admitting: *Deleted

## 2014-10-11 ENCOUNTER — Telehealth: Payer: Self-pay | Admitting: *Deleted

## 2014-10-11 NOTE — Telephone Encounter (Signed)
-----   Message from Marlis EdelsonWalidah N Karim, [place] sent at 10/10/2014 10:11 PM EDT ----- Regarding: New Appt and First Screen Order Please schedule for new OB appt within the next 1-2 weeks.  Put in HR clinic for history of seizures and brain aneurysm.    Please schedule First Screen due to patient's gestational age and almost close to deadline.

## 2014-10-11 NOTE — Telephone Encounter (Signed)
First Screen ordered for 10/20/14 at 1400. Called patient and found that her number is not a working number. Letter sent to patient.

## 2014-10-18 ENCOUNTER — Encounter: Payer: Medicaid Other | Admitting: Family Medicine

## 2014-10-20 ENCOUNTER — Ambulatory Visit (HOSPITAL_COMMUNITY): Admission: RE | Admit: 2014-10-20 | Payer: Medicaid Other | Source: Ambulatory Visit

## 2014-10-21 ENCOUNTER — Encounter: Payer: Self-pay | Admitting: Obstetrics & Gynecology

## 2014-10-21 ENCOUNTER — Ambulatory Visit (INDEPENDENT_AMBULATORY_CARE_PROVIDER_SITE_OTHER): Payer: Medicaid Other | Admitting: Obstetrics & Gynecology

## 2014-10-21 VITALS — BP 121/64 | HR 89 | Wt 199.6 lb

## 2014-10-21 DIAGNOSIS — O99352 Diseases of the nervous system complicating pregnancy, second trimester: Principal | ICD-10-CM

## 2014-10-21 DIAGNOSIS — G40909 Epilepsy, unspecified, not intractable, without status epilepticus: Secondary | ICD-10-CM

## 2014-10-21 DIAGNOSIS — O0990 Supervision of high risk pregnancy, unspecified, unspecified trimester: Secondary | ICD-10-CM | POA: Insufficient documentation

## 2014-10-21 DIAGNOSIS — O0991 Supervision of high risk pregnancy, unspecified, first trimester: Secondary | ICD-10-CM

## 2014-10-21 DIAGNOSIS — O99351 Diseases of the nervous system complicating pregnancy, first trimester: Secondary | ICD-10-CM

## 2014-10-21 LAB — POCT URINALYSIS DIP (DEVICE)
Bilirubin Urine: NEGATIVE
Glucose, UA: NEGATIVE mg/dL
Hgb urine dipstick: NEGATIVE
Ketones, ur: NEGATIVE mg/dL
LEUKOCYTES UA: NEGATIVE
Nitrite: NEGATIVE
Protein, ur: 30 mg/dL — AB
Specific Gravity, Urine: >=1.03 (ref 1.005–1.030)
Urobilinogen, UA: 0.2 mg/dL (ref 0.0–1.0)
pH: 5.5 (ref 5.0–8.0)

## 2014-10-21 MED ORDER — PROMETHAZINE HCL 25 MG PO TABS: 25.0000 mg | ORAL_TABLET | Freq: Four times a day (QID) | ORAL | Status: DC | PRN

## 2014-10-21 MED ORDER — PRENATAL VITAMINS 0.8 MG PO TABS: 1.0000 | ORAL_TABLET | Freq: Every day | ORAL | Status: DC

## 2014-10-21 NOTE — Progress Notes (Signed)
First trimester screen and dating U/S 10/26/14 @ 1115a with [facility].

## 2014-10-21 NOTE — Progress Notes (Signed)
New OB with unsure dates, H/O seizures on Lamictal. Needs US for dates  Subjective:Seizure D/O    [name] is a [id number] 2776w6d being seen today for her first obstetrical visit.  Her obstetrical history is significant for seizure d/o. Patient does intend to breast feed. Pregnancy history fully reviewed.  Patient reports nausea, no bleeding and no leaking.  Filed Vitals:   10/21/14 0842  BP: 121/64  Pulse: 89  Weight: 199 lb 9.6 oz (90.538 kg)    HISTORY: OB History  Gravida Para Term Preterm AB SAB TAB Ectopic Multiple Living  5 3 3  1 1    3     # Outcome Date GA Lbr Len/2nd Weight Sex Delivery Anes PTL Lv  5 Current           4 Term 12/20/13 10858w0d 20:01 / 02:00 7 lb 8.8 oz (3.425 kg) M Vag-Spont EPI  Y  3 Term 02/17/01 2320w0d  6 lb 5 oz (2.863 kg) F Vag-Spont        Comments: no complications[facility]  2 Term 01/19/00 2420w0d  6 lb 7 oz (2.92 kg) M Vag-Spont EPI       Comments: no complications, born [facility]  1 SAB              Past Medical History  Diagnosis Date  . Asthma   . Aneurysm   . Panic attack   . 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 Aneurysm 2002  . Thyroid disease    Past Surgical History  Procedure Laterality Date  . Brain surgery    . Tonsillectomy    . Aneurysm coiling     Family History  Problem Relation Age of Onset  . Thyroid disease Mother   . Aortic aneurysm Mother     heart  . Diabetes Mother   . Hypertension Maternal Aunt   . Hypertension Maternal Uncle   . Hypertension Maternal Grandmother   . Diabetes Maternal Grandmother      Exam    Uterus:  Fundal Height: 8 cm  Pelvic Exam:    Perineum: No Hemorrhoids   Vulva: normal   Vagina:  normal mucosa   pH:     Cervix: no lesions   Adnexa: normal adnexa   Bony Pelvis: average  System: Breast:      Skin: normal coloration and turgor, no rashes    Neurologic: oriented, normal   Extremities: normal strength, tone, and  muscle mass   HEENT neck supple with midline trachea   Mouth/Teeth dental hygiene good   Neck supple   Cardiovascular: regular rate and rhythm   Respiratory:  appears well, vitals normal, no respiratory distress, acyanotic, normal RR   Abdomen: soft, non-tender; bowel sounds normal; no masses,  no organomegaly   Urinary: urethral meatus normal      Assessment:    Pregnancy: [id number] Patient Active Problem List   Diagnosis Date Noted  . Supervision of high risk pregnancy, antepartum 10/21/2014  . Obesity (BMI 30.0-34.9) 02/16/2014  . Seizure disorder in pregnancy, antepartum 05/20/2013        Plan:     Initial labs drawn. Prenatal vitamins. Problem list reviewed and updated. Genetic Screening discussed First Screen:needs dating US  Ultrasound discussed; fetal survey  Follow up in 4 weeks. 50% of 30 min visit spent on counseling and coordination of care.  1 hr early today   Olinda Nola 10/21/2014

## 2014-10-21 NOTE — Progress Notes (Signed)
Requesting Rx for PNV and something for nausea.

## 2014-10-21 NOTE — Progress Notes (Signed)
1st visit with RD.  Wt loss of 4+6.  Pt. Reports very poor appetite and extreme nausea.  States she had the same symptoms with a previous pregnancy which resolved later in the pregnancy. Diet:  Eats 1-2 times/day.  Very few foods that don't make her nauseous.  Drinking plenty of water.  PNV daily. [facility]. Discussed the importance of eating every 2-3 hours and eating foods that she is able to keep down.  Continue drinking adequate water daily. F/U 4-6 weeks. Candice C. Earlene Plateravis, MPH, RD, LDN

## 2014-10-22 LAB — PRENATAL PROFILE (SOLSTAS)
ANTIBODY SCREEN: NEGATIVE
BASOS PCT: 0 % (ref 0–1)
Basophils Absolute: 0 10*3/uL (ref 0.0–0.1)
EOS ABS: 0.1 10*3/uL (ref 0.0–0.7)
EOS PCT: 1 % (ref 0–5)
HCT: 37.1 % (ref 36.0–46.0)
HEP B S AG: NEGATIVE
HIV 1&2 Ab, 4th Generation: NONREACTIVE
Hemoglobin: 12.8 g/dL (ref 12.0–15.0)
Lymphocytes Relative: 30 % (ref 12–46)
Lymphs Abs: 2 10*3/uL (ref 0.7–4.0)
MCH: 31.6 pg (ref 26.0–34.0)
MCHC: 34.5 g/dL (ref 30.0–36.0)
MCV: 91.6 fL (ref 78.0–100.0)
MPV: 8.8 fL (ref 8.6–12.4)
Monocytes Absolute: 0.5 10*3/uL (ref 0.1–1.0)
Monocytes Relative: 7 % (ref 3–12)
Neutro Abs: 4.1 10*3/uL (ref 1.7–7.7)
Neutrophils Relative %: 62 % (ref 43–77)
Platelets: 283 10*3/uL (ref 150–400)
RBC: 4.05 MIL/uL (ref 3.87–5.11)
RDW: 13.7 % (ref 11.5–15.5)
Rh Type: POSITIVE
Rubella: 1.77 Index — ABNORMAL HIGH (ref <0.90)
WBC: 6.6 10*3/uL (ref 4.0–10.5)

## 2014-10-22 LAB — PRESCRIPTION MONITORING PROFILE (19 PANEL)
Amphetamine/Meth: NEGATIVE ng/mL
Barbiturate Screen, Urine: NEGATIVE ng/mL
Benzodiazepine Screen, Urine: NEGATIVE ng/mL
Buprenorphine, Urine: NEGATIVE ng/mL
Cannabinoid Scrn, Ur: NEGATIVE ng/mL
Carisoprodol, Urine: NEGATIVE ng/mL
Cocaine Metabolites: NEGATIVE ng/mL
Creatinine, Urine: 240.55 mg/dL (ref >20.0)
ECSTASY: NEGATIVE ng/mL
Fentanyl, Ur: NEGATIVE ng/mL
Meperidine, Ur: NEGATIVE ng/mL
Methadone Screen, Urine: NEGATIVE ng/mL
Methaqualone: NEGATIVE ng/mL
NITRITES URINE, INITIAL: NEGATIVE ug/mL
Opiate Screen, Urine: NEGATIVE ng/mL
Oxycodone Screen, Ur: NEGATIVE ng/mL
PROPOXYPHENE: NEGATIVE ng/mL
Phencyclidine, Ur: NEGATIVE ng/mL
TAPENTADOLUR: NEGATIVE ng/mL
Tramadol Scrn, Ur: NEGATIVE ng/mL
Zolpidem, Urine: NEGATIVE ng/mL
pH, Initial: 5.9 pH (ref 4.5–8.9)

## 2014-10-22 LAB — CULTURE, OB URINE
Colony Count: NO GROWTH
Organism ID, Bacteria: NO GROWTH

## 2014-10-22 LAB — GC/CHLAMYDIA PROBE AMP
CT Probe RNA: NEGATIVE
GC Probe RNA: NEGATIVE

## 2014-10-22 LAB — GLUCOSE TOLERANCE, 1 HOUR (50G) W/O FASTING: Glucose, 1 Hour GTT: 113 mg/dL (ref 70–140)

## 2014-10-26 ENCOUNTER — Ambulatory Visit (HOSPITAL_COMMUNITY)
Admission: RE | Admit: 2014-10-26 | Discharge: 2014-10-26 | Disposition: A | Discharge status: Home / Self Care | Payer: Medicaid Other | Source: Ambulatory Visit | Attending: Family | Admitting: Family

## 2014-10-26 ENCOUNTER — Encounter (HOSPITAL_COMMUNITY): Payer: Self-pay

## 2014-10-26 DIAGNOSIS — Z36 Encounter for antenatal screening of mother: Secondary | ICD-10-CM | POA: Insufficient documentation

## 2014-10-26 DIAGNOSIS — G40909 Epilepsy, unspecified, not intractable, without status epilepticus: Secondary | ICD-10-CM

## 2014-10-26 DIAGNOSIS — O99353 Diseases of the nervous system complicating pregnancy, third trimester: Secondary | ICD-10-CM

## 2014-10-26 DIAGNOSIS — Z3A13 13 weeks gestation of pregnancy: Secondary | ICD-10-CM | POA: Diagnosis not present

## 2014-10-26 DIAGNOSIS — O0991 Supervision of high risk pregnancy, unspecified, first trimester: Secondary | ICD-10-CM

## 2014-11-01 ENCOUNTER — Other Ambulatory Visit (HOSPITAL_COMMUNITY): Payer: Self-pay | Admitting: Obstetrics & Gynecology

## 2014-11-01 ENCOUNTER — Encounter: Payer: Self-pay | Admitting: Obstetrics & Gynecology

## 2014-11-18 ENCOUNTER — Telehealth: Payer: Self-pay | Admitting: Obstetrics & Gynecology

## 2014-11-18 ENCOUNTER — Encounter: Payer: Medicaid Other | Admitting: Obstetrics & Gynecology

## 2014-11-18 ENCOUNTER — Encounter: Payer: Self-pay | Admitting: Obstetrics & Gynecology

## 2014-11-18 NOTE — Telephone Encounter (Signed)
Called left message for patient to call clinic. Mailing dnka letter, due to missed ob follow up appointment.

## 2014-12-15 ENCOUNTER — Emergency Department (HOSPITAL_COMMUNITY)
Admission: EM | Admit: 2014-12-15 | Discharge: 2014-12-15 | Disposition: A | Discharge status: Home / Self Care | Payer: Medicaid Other | Attending: Emergency Medicine | Admitting: Emergency Medicine

## 2014-12-15 ENCOUNTER — Encounter (HOSPITAL_COMMUNITY): Payer: Self-pay | Admitting: *Deleted

## 2014-12-15 DIAGNOSIS — Z79899 Other long term (current) drug therapy: Secondary | ICD-10-CM | POA: Insufficient documentation

## 2014-12-15 DIAGNOSIS — Z87891 Personal history of nicotine dependence: Secondary | ICD-10-CM | POA: Diagnosis not present

## 2014-12-15 DIAGNOSIS — H578 Other specified disorders of eye and adnexa: Secondary | ICD-10-CM | POA: Diagnosis present

## 2014-12-15 DIAGNOSIS — F410 Panic disorder [episodic paroxysmal anxiety] without agoraphobia: Secondary | ICD-10-CM | POA: Diagnosis not present

## 2014-12-15 DIAGNOSIS — J45909 Unspecified asthma, uncomplicated: Secondary | ICD-10-CM | POA: Insufficient documentation

## 2014-12-15 DIAGNOSIS — Z8679 Personal history of other diseases of the circulatory system: Secondary | ICD-10-CM | POA: Diagnosis not present

## 2014-12-15 DIAGNOSIS — Z8639 Personal history of other endocrine, nutritional and metabolic disease: Secondary | ICD-10-CM | POA: Insufficient documentation

## 2014-12-15 DIAGNOSIS — H109 Unspecified conjunctivitis: Secondary | ICD-10-CM | POA: Insufficient documentation

## 2014-12-15 MED ORDER — POLYMYXIN B-TRIMETHOPRIM 10000-0.1 UNIT/ML-% OP SOLN
2.0000 [drp] | OPHTHALMIC | Status: DC
Start: 2014-12-15 — End: 2015-08-20

## 2014-12-15 NOTE — ED Provider Notes (Signed)
CSN: [id number]     Arrival date & time 12/15/14  1259 History   This chart was scribed for Santiago Glad, PA-C working with Gerhard Munch, MD by Elveria Rising, ED Scribe. This patient was seen in room TR04C/TR04C and the patient's care was started at 2:02 PM.   Chief Complaint  Patient presents with  . Eye Problem   The history is provided by the patient. No language interpreter was used.   HPI Comments: Hannah Cook is a 33 y.o. female who presents to the Emergency Department complaining of left eye pain and irritation with discharge upon wakening this morning. Patient reports blurred vision due to discharge and irritation. Patient shares that her son was recently diagnosed with pink eye here in ED and discharged with antibiotic eye drops. Patient denies fever, chills or headache. No visual field defects.  Patient denies direct injury, trauma or presence of foreign body. Patient reports similar symptoms two weeks which resolved with cool compresses.    Past Medical History  Diagnosis Date  . Asthma   . Aneurysm   . Panic attack   . 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 Aneurysm 2002  . Thyroid disease    Past Surgical History  Procedure Laterality Date  . Brain surgery    . Tonsillectomy    . Aneurysm coiling     Family History  Problem Relation Age of Onset  . Thyroid disease Mother   . Aortic aneurysm Mother     heart  . Diabetes Mother   . Hypertension Maternal Aunt   . Hypertension Maternal Uncle   . Hypertension Maternal Grandmother   . Diabetes Maternal Grandmother    History  Substance Use Topics  . Smoking status: Former Smoker -- 2.00 packs/day for 18 years    Types: Cigarettes    Quit date: 12/19/2013  . Smokeless tobacco: Never Used  . Alcohol Use: No   OB History    Gravida Para Term Preterm AB TAB SAB Ectopic Multiple Living   Review of Systems  Constitutional: Negative for fever and chills.  Eyes:  Positive for pain and discharge.  Neurological: Negative for headaches.      Allergies  Hydroxacen and Vicodin  Home Medications   Prior to Admission medications   Medication Sig Start Date End Date Taking? Authorizing Provider  amoxicillin (AMOXIL) 500 MG capsule Take 1 capsule (500 mg total) by mouth 3 (three) times daily. Patient not taking: Reported on 10/21/2014 10/10/14   Marlis Edelson, CNM  lamoTRIgine (LAMICTAL) 100 MG tablet Take 100 mg by mouth 2 (two) times daily.    Historical Provider, MD  Prenatal Multivit-Min-Fe-FA (PRENATAL VITAMINS) 0.8 MG tablet Take 1 tablet by mouth daily. Patient not taking: Reported on 10/26/2014 10/21/14   Adam Phenix, MD  promethazine (PHENERGAN) 25 MG tablet Take 1 tablet (25 mg total) by mouth every 6 (six) hours as needed for nausea or vomiting. Patient not taking: Reported on 10/26/2014 10/21/14   Adam Phenix, MD   Triage Vitals: BP 119/80 mmHg  Pulse 84  Temp(Src) 98.2 F (36.8 C) (Oral)  Resp 18  Ht  (1.676 m)  SpO2 97%  LMP 07/23/2014 (Approximate)  Breastfeeding? Unknown Physical Exam  Constitutional: She is oriented to person, place, and time. She appears well-developed and well-nourished. No distress.  HENT:  Head: Normocephalic and atraumatic.  Eyes: EOM and lids are normal. Pupils are equal, round, and reactive to light. Lids are everted and swept, no foreign bodies found. No foreign body present in the left eye. Left conjunctiva is injected.  Diffuse injection of left sclera with small amount of purulent discharge. No foreign bodies.   Neck: Neck supple. No tracheal deviation present.  Cardiovascular: Normal rate.   Pulmonary/Chest: Effort normal. No respiratory distress.  Musculoskeletal: Normal range of motion.  Neurological: She is alert and oriented to person, place, and time.  Skin: Skin is warm and dry.  Psychiatric: She has a normal mood and affect. Her behavior is normal.  Nursing note and vitals  reviewed.   ED Course  Procedures (including critical care time)  COORDINATION OF CARE: 2:09 PM- Discussed treatment plan with patient at bedside and patient agreed to plan.    Labs Review Labs Reviewed - No data to display  Imaging Review No results found.   EKG Interpretation None      MDM   Final diagnoses:  None   Patient presents today with redness of the left eye with a burning eye pain.  Son diagnosed earlier today with Bacterial Conjunctivitis.  No injury of the eye.  Normal visual acuity.  Symptoms most consistent with Conjunctivitis.  Patient stable for discharge.  Return precautions given.   Santiago GladHeather Ocia Simek, PA-C 12/15/14 1540  Gerhard Munchobert Lockwood, MD 12/15/14 [phone]

## 2014-12-15 NOTE — ED Notes (Signed)
Pt woke up this am with left eye pain, redness and blurred vision. Denies injury to eye. Reports its a burning pain.

## 2015-01-14 ENCOUNTER — Encounter (HOSPITAL_COMMUNITY): Payer: Self-pay | Admitting: *Deleted

## 2015-01-14 ENCOUNTER — Emergency Department (HOSPITAL_COMMUNITY)
Admission: EM | Admit: 2015-01-14 | Discharge: 2015-01-14 | Disposition: A | Discharge status: Home / Self Care | Payer: Medicaid Other | Attending: Emergency Medicine | Admitting: Emergency Medicine

## 2015-01-14 ENCOUNTER — Emergency Department (HOSPITAL_COMMUNITY): Payer: Medicaid Other

## 2015-01-14 DIAGNOSIS — Z87891 Personal history of nicotine dependence: Secondary | ICD-10-CM | POA: Diagnosis not present

## 2015-01-14 DIAGNOSIS — Y939 Activity, unspecified: Secondary | ICD-10-CM | POA: Diagnosis not present

## 2015-01-14 DIAGNOSIS — J45909 Unspecified asthma, uncomplicated: Secondary | ICD-10-CM | POA: Insufficient documentation

## 2015-01-14 DIAGNOSIS — Z8679 Personal history of other diseases of the circulatory system: Secondary | ICD-10-CM | POA: Insufficient documentation

## 2015-01-14 DIAGNOSIS — Y929 Unspecified place or not applicable: Secondary | ICD-10-CM | POA: Insufficient documentation

## 2015-01-14 DIAGNOSIS — S0003XA Contusion of scalp, initial encounter: Secondary | ICD-10-CM | POA: Diagnosis not present

## 2015-01-14 DIAGNOSIS — Z79899 Other long term (current) drug therapy: Secondary | ICD-10-CM | POA: Insufficient documentation

## 2015-01-14 DIAGNOSIS — T1490XA Injury, unspecified, initial encounter: Secondary | ICD-10-CM

## 2015-01-14 DIAGNOSIS — Z8669 Personal history of other diseases of the nervous system and sense organs: Secondary | ICD-10-CM | POA: Diagnosis not present

## 2015-01-14 DIAGNOSIS — S0990XA Unspecified injury of head, initial encounter: Secondary | ICD-10-CM | POA: Diagnosis present

## 2015-01-14 DIAGNOSIS — G40909 Epilepsy, unspecified, not intractable, without status epilepticus: Secondary | ICD-10-CM | POA: Diagnosis not present

## 2015-01-14 DIAGNOSIS — Z8639 Personal history of other endocrine, nutritional and metabolic disease: Secondary | ICD-10-CM | POA: Insufficient documentation

## 2015-01-14 DIAGNOSIS — Y999 Unspecified external cause status: Secondary | ICD-10-CM | POA: Insufficient documentation

## 2015-01-14 MED ORDER — IBUPROFEN 800 MG PO TABS: 800.0000 mg | ORAL_TABLET | Freq: Three times a day (TID) | ORAL | Status: DC

## 2015-01-14 MED ORDER — IBUPROFEN 800 MG PO TABS
800.0000 mg | ORAL_TABLET | Freq: Once | ORAL | Status: AC
  Administered 2015-01-14: 800 mg via ORAL
  Filled 2015-01-14: qty 1
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
## 2015-01-14 NOTE — Discharge Instructions (Signed)
Contusion °A contusion is a deep bruise. Contusions happen when an injury causes bleeding under the skin. Signs of bruising include pain, puffiness (swelling), and discolored skin. The contusion may turn blue, purple, or yellow. °HOME CARE  °· Put ice on the injured area. °¨ Put ice in a plastic bag. °¨ Place a towel between your skin and the bag. °¨ Leave the ice on for 15-20 minutes, 03-04 times a day. °· Only take medicine as told by your doctor. °· Rest the injured area. °· If possible, raise (elevate) the injured area to lessen puffiness. °GET HELP RIGHT AWAY IF:  °· You have more bruising or puffiness. °· You have pain that is getting worse. °· Your puffiness or pain is not helped by medicine. °MAKE SURE YOU:  °· Understand these instructions. °· Will watch your condition. °· Will get help right away if you are not doing well or get worse. °Document Released: 11/21/2007 Document Revised: 08/27/2011 Document Reviewed: 04/09/2011 °ExitCare® Patient Information ©2015 [employer]. This information is not intended to replace advice given to you by your health care provider. Make sure you discuss any questions you have with your health care provider. ° °Assault, General °Assault includes any behavior, whether intentional or reckless, which results in bodily injury to another person and/or damage to property. Included in this would be any behavior, intentional or reckless, that by its nature would be understood (interpreted) by a reasonable person as intent to harm another person or to damage his/her property. Threats may be oral or written. They may be communicated through regular mail, computer, fax, or phone. These threats may be direct or implied. °FORMS OF ASSAULT INCLUDE: °· Physically assaulting a person. This includes physical threats to inflict physical harm as well as: °¨ Slapping. °¨ Hitting. °¨ Poking. °¨ Kicking. °¨ Punching. °¨ Pushing. °· Arson. °· Sabotage. °· Equipment vandalism. °· Damaging or  destroying property. °· Throwing or hitting objects. °· Displaying a weapon or an object that appears to be a weapon in a threatening manner. °¨ Carrying a firearm of any kind. °¨ Using a weapon to harm someone. °· Using greater physical size/strength to intimidate another. °¨ Making intimidating or threatening gestures. °¨ Bullying. °¨ Hazing. °· Intimidating, threatening, hostile, or abusive language directed toward another person. °¨ It communicates the intention to engage in violence against that person. And it leads a reasonable person to expect that violent behavior may occur. °· Stalking another person. °IF IT HAPPENS AGAIN: °· Immediately call for emergency help (911 in [place] °· If someone poses clear and immediate danger to you, seek legal authorities to have a protective or restraining order put in place. °· Less threatening assaults can at least be reported to authorities. °STEPS TO TAKE IF A SEXUAL ASSAULT HAS HAPPENED °· Go to an area of safety. This may include a shelter or staying with a friend. Stay away from the area where you have been attacked. A large percentage of sexual assaults are caused by a friend, relative or associate. °· If medications were given by your caregiver, take them as directed for the full length of time prescribed. °· Only take over-the-counter or prescription medicines for pain, discomfort, or fever as directed by your caregiver. °· If you have come in contact with a sexual disease, find out if you are to be tested again. If your caregiver is concerned about the HIV/AIDS virus, he/she may require you to have continued testing for several months. °· For the protection of your   privacy, test results can not be given over the phone. Make sure you receive the results of your test. If your test results are not back during your visit, make an appointment with your caregiver to find out the results. Do not assume everything is normal if you have not heard from your caregiver or the  medical facility. It is important for you to follow up on all of your test results. °· File appropriate papers with authorities. This is important in all assaults, even if it has occurred in a family or by a friend. °SEEK MEDICAL CARE IF: °· You have new problems because of your injuries. °· You have problems that may be because of the medicine you are taking, such as: °¨ Rash. °¨ Itching. °¨ Swelling. °¨ Trouble breathing. °· You develop belly (abdominal) pain, feel sick to your stomach (nausea) or are vomiting. °· You begin to run a temperature. °· You need supportive care or referral to a rape crisis center. These are centers with trained personnel who can help you get through this ordeal. °SEEK IMMEDIATE MEDICAL CARE IF: °· You are afraid of being threatened, beaten, or abused. In [place] call 911. °· You receive new injuries related to abuse. °· You develop severe pain in any area injured in the assault or have any change in your condition that concerns you. °· You faint or lose consciousness. °· You develop chest pain or shortness of breath. °Document Released: 06/04/2005 Document Revised: 08/27/2011 Document Reviewed: 01/21/2008 °ExitCare® Patient Information ©2015 [employer]. This information is not intended to replace advice given to you by your health care provider. Make sure you discuss any questions you have with your health care provider. ° °

## 2015-01-14 NOTE — ED Notes (Signed)
Pt left with belongings and ambulated out of treatment area.  
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
## 2015-02-07 ENCOUNTER — Encounter (HOSPITAL_COMMUNITY): Payer: Self-pay | Admitting: *Deleted

## 2015-02-07 ENCOUNTER — Emergency Department (HOSPITAL_COMMUNITY)
Admission: EM | Admit: 2015-02-07 | Discharge: 2015-02-07 | Disposition: A | Discharge status: Home / Self Care | Payer: Medicaid Other | Attending: Emergency Medicine | Admitting: Emergency Medicine

## 2015-02-07 DIAGNOSIS — J45909 Unspecified asthma, uncomplicated: Secondary | ICD-10-CM | POA: Diagnosis not present

## 2015-02-07 DIAGNOSIS — K088 Other specified disorders of teeth and supporting structures: Secondary | ICD-10-CM | POA: Diagnosis not present

## 2015-02-07 DIAGNOSIS — Z8639 Personal history of other endocrine, nutritional and metabolic disease: Secondary | ICD-10-CM | POA: Insufficient documentation

## 2015-02-07 DIAGNOSIS — Z79899 Other long term (current) drug therapy: Secondary | ICD-10-CM | POA: Diagnosis not present

## 2015-02-07 DIAGNOSIS — Z87891 Personal history of nicotine dependence: Secondary | ICD-10-CM | POA: Insufficient documentation

## 2015-02-07 DIAGNOSIS — G40909 Epilepsy, unspecified, not intractable, without status epilepticus: Secondary | ICD-10-CM | POA: Diagnosis not present

## 2015-02-07 DIAGNOSIS — K0889 Other specified disorders of teeth and supporting structures: Secondary | ICD-10-CM

## 2015-02-07 NOTE — ED Notes (Signed)
Pt left before given d/c papers rt being extremely upset about not receiving narcotic for dental pain.

## 2015-02-07 NOTE — ED Notes (Signed)
Pt states she is having right lower tooth pain. Pt has positive dental caries throughout. Pt states she went to the dentist 1 month ago and have appt with oral surgery on 10/12, but can't deal with the pain.

## 2015-02-07 NOTE — ED Provider Notes (Signed)
CSN: [id number]     Arrival date & time 02/07/15  1341 History  This chart was scribed for non-physician practitioner, Joycie Peek, PA-C, working with Lavera Guise, MD by Charline Bills, ED Scribe. This patient was seen in room TR07C/TR07C and the patient's care was started at 2:26 PM.   Chief Complaint  Patient presents with  . Dental Pain   The history is provided by the patient. No language interpreter was used.   HPI Comments: Hannah Cook is a 34 y.o. female who presents to the Emergency Department complaining of constant right lower dental pain for the past month, worsened over the past few days. Pt reports worsening dental pain that is exacerbated with chewing. She has tried 1.5-2 800 mg ibuprofen every 5 hours and 1-2 Tylenol daily without significant relief. She denies fever, difficulty swallowing, SOB, difficulty breathing. Pt was seen by her dentist last month and prescribed 800 mg ibuprofen and antibiotics which she states improved swelling. She has an upcoming appointment with an oral surgeon on 04/13/15.   Past Medical History  Diagnosis Date  . Asthma   . Aneurysm   . Panic attack   . 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 Aneurysm 2002  . Thyroid disease    Past Surgical History  Procedure Laterality Date  . Brain surgery    . Tonsillectomy    . Aneurysm coiling     Family History  Problem Relation Age of Onset  . Thyroid disease Mother   . Aortic aneurysm Mother     heart  . Diabetes Mother   . Hypertension Maternal Aunt   . Hypertension Maternal Uncle   . Hypertension Maternal Grandmother   . Diabetes Maternal Grandmother    Social History  Substance Use Topics  . Smoking status: Former Smoker -- 2.00 packs/day for 18 years    Types: Cigarettes    Quit date: 12/19/2013  . Smokeless tobacco: Never Used  . Alcohol Use: No   OB History    Gravida Para Term Preterm AB TAB SAB Ectopic Multiple Living   5 3 3  1  1   3       Review of Systems  Constitutional: Negative for fever.  HENT: Positive for dental problem. Negative for trouble swallowing.   Respiratory: Negative for shortness of breath.    Allergies  Hydroxacen and Vicodin  Home Medications   Prior to Admission medications   Medication Sig Start Date End Date Taking? Authorizing Provider  amoxicillin (AMOXIL) 500 MG capsule Take 1 capsule (500 mg total) by mouth 3 (three) times daily. Patient not taking: Reported on 10/21/2014 10/10/14   Marlis Edelson, CNM  ibuprofen (ADVIL,MOTRIN) 800 MG tablet Take 1 tablet (800 mg total) by mouth 3 (three) times daily. 01/14/15   Elpidio Anis, PA-C  lamoTRIgine (LAMICTAL) 100 MG tablet Take 100 mg by mouth 2 (two) times daily.    Historical Provider, MD  Prenatal Multivit-Min-Fe-FA (PRENATAL VITAMINS) 0.8 MG tablet Take 1 tablet by mouth daily. Patient not taking: Reported on 10/26/2014 10/21/14   Adam Phenix, MD  promethazine (PHENERGAN) 25 MG tablet Take 1 tablet (25 mg total) by mouth every 6 (six) hours as needed for nausea or vomiting. Patient not taking: Reported on 10/26/2014 10/21/14   Adam Phenix, MD  trimethoprim-polymyxin b [facility]) ophthalmic solution Place 2 drops into the left eye every 4 (four) hours. 12/15/14   Heather Laisure, PA-C   BP  112/67 mmHg  Pulse 77  Temp(Src) 98.7 F (37.1 C) (Oral)  Resp 20  Ht  (1.676 m)  Wt 189 lb (85.73 kg)  BMI 30.52 kg/m2  SpO2 100%  LMP 07/23/2014 (Approximate) Physical Exam  Constitutional: She is oriented to person, place, and time. She appears well-developed and well-nourished. No distress.  HENT:  Head: Normocephalic and atraumatic.  Dental pain located to right mandibular jaw. No obvious erythema or gingival swelling noted. No evidence of drainable abscess. No tonsillar swelling. Uvula is midline. Overall appropriate dentition with several caries. Patent airway, tolerating secretions well.  Eyes: Conjunctivae and EOM are normal.  Neck: Neck  supple. No tracheal deviation present.  No submandibular erythema, swelling or tenderness.  Cardiovascular: Normal rate.   Pulmonary/Chest: Effort normal. No respiratory distress.  Musculoskeletal: Normal range of motion.  Neurological: She is alert and oriented to person, place, and time.  Skin: Skin is warm and dry.  Psychiatric: She has a normal mood and affect. Her behavior is normal.  Nursing note and vitals reviewed.  ED Course  Procedures (including critical care time) DIAGNOSTIC STUDIES: Oxygen Saturation is 100% on RA, normal by my interpretation.    COORDINATION OF CARE: 2:30 PM-Pt was offered dental block which she refused. Discussed treatment plan which includes continue Tylenol, ibuprofen and follow-up with oral surgeon with pt at bedside and pt agreed to plan.    Labs Review Labs Reviewed - No data to display  Imaging Review No results found. I have personally reviewed and evaluated these images and lab results as part of my medical decision-making.   EKG Interpretation None     Filed Vitals:   02/07/15 1411  BP: 112/67  Pulse: 77  Temp: 98.7 F (37.1 C)  TempSrc: Oral  Resp: 20  Height:  (1.676 m)  Weight: 189 lb (85.73 kg)  SpO2: 100%    MDM  Vitals stable - WNL -afebrile Pt resting comfortably in ED. PE--no obvious abscess. No retropharyngeal or peritonsillar abscess. No Ludwig angina. Patient refuses oral nerve block in the ED. States she does not want any other intervention at this time. Will follow-up with dentistry. Encouraged continued use of Motrin and Tylenol at home.  I discussed all relevant lab findings and imaging results with pt and they verbalized understanding. Discussed f/u with PCP within 48 hrs and return precautions, pt very amenable to plan.  Final diagnoses:  Pain, dental    I personally performed the services described in this documentation, which was scribed in my presence. The recorded information has been reviewed  and is accurate.     Joycie Peek, PA-C 02/07/15 1440  Lavera Guise, MD 02/07/15 [phone]

## 2015-02-07 NOTE — Discharge Instructions (Signed)
Follow-up with your dentist as needed for further evaluation and management of your dental pain. Return to ED for worsening symptoms.  Dental Pain A tooth ache may be caused by cavities (tooth decay). Cavities expose the nerve of the tooth to air and hot or cold temperatures. It may come from an infection or abscess (also called a boil or furuncle) around your tooth. It is also often caused by dental caries (tooth decay). This causes the pain you are having. DIAGNOSIS  Your caregiver can diagnose this problem by exam. TREATMENT   If caused by an infection, it may be treated with medications which kill germs (antibiotics) and pain medications as prescribed by your caregiver. Take medications as directed.  Only take over-the-counter or prescription medicines for pain, discomfort, or fever as directed by your caregiver.  Whether the tooth ache today is caused by infection or dental disease, you should see your dentist as soon as possible for further care. SEEK MEDICAL CARE IF: The exam and treatment you received today has been provided on an emergency basis only. This is not a substitute for complete medical or dental care. If your problem worsens or new problems (symptoms) appear, and you are unable to meet with your dentist, call or return to this location. SEEK IMMEDIATE MEDICAL CARE IF:   You have a fever.  You develop redness and swelling of your face, jaw, or neck.  You are unable to open your mouth.  You have severe pain uncontrolled by pain medicine. MAKE SURE YOU:   Understand these instructions.  Will watch your condition.  Will get help right away if you are not doing well or get worse. Document Released: 06/04/2005 Document Revised: 08/27/2011 Document Reviewed: 01/21/2008 [facility] Patient Information 2015 [place], [place]. This information is not intended to replace advice given to you by your health care provider. Make sure you discuss any questions you have with your health  care provider.  Dental Care and Dentist Visits Dental care supports good overall health. Regular dental visits can also help you avoid dental pain, bleeding, infection, and other more serious health problems in the future. It is important to keep the mouth healthy because diseases in the teeth, gums, and other oral tissues can spread to other areas of the body. Some problems, such as diabetes, heart disease, and pre-term labor have been associated with poor oral health.  See your dentist every 6 months. If you experience emergency problems such as a toothache or broken tooth, go to the dentist right away. If you see your dentist regularly, you may catch problems early. It is easier to be treated for problems in the early stages.  WHAT TO EXPECT AT A DENTIST VISIT  Your dentist will look for many common oral health problems and recommend proper treatment. At your regular dental visit, you can expect:  Gentle cleaning of the teeth and gums. This includes scraping and polishing. This helps to remove the sticky substance around the teeth and gums (plaque). Plaque forms in the mouth shortly after eating. Over time, plaque hardens on the teeth as tartar. If tartar is not removed regularly, it can cause problems. Cleaning also helps remove stains.  Periodic X-rays. These pictures of the teeth and supporting bone will help your dentist assess the health of your teeth.  Periodic fluoride treatments. Fluoride is a natural mineral shown to help strengthen teeth. Fluoride treatmentinvolves applying a fluoride gel or varnish to the teeth. It is most commonly done in children.  Examination of  the mouth, tongue, jaws, teeth, and gums to look for any oral health problems, such as:  Cavities (dental caries). This is decay on the tooth caused by plaque, sugar, and acid in the mouth. It is best to catch a cavity when it is small.  Inflammation of the gums caused by plaque buildup (gingivitis).  Problems with the  mouth or malformed or misaligned teeth.  Oral cancer or other diseases of the soft tissues or jaws. KEEP YOUR TEETH AND GUMS HEALTHY For healthy teeth and gums, follow these general guidelines as well as your dentist's specific advice:  Have your teeth professionally cleaned at the dentist every 6 months.  Brush twice daily with a fluoride toothpaste.  Floss your teeth daily.  Ask your dentist if you need fluoride supplements, treatments, or fluoride toothpaste.  Eat a healthy diet. Reduce foods and drinks with added sugar.  Avoid smoking. TREATMENT FOR ORAL HEALTH PROBLEMS If you have oral health problems, treatment varies depending on the conditions present in your teeth and gums.  Your caregiver will most likely recommend good oral hygiene at each visit.  For cavities, gingivitis, or other oral health disease, your caregiver will perform a procedure to treat the problem. This is typically done at a separate appointment. Sometimes your caregiver will refer you to another dental specialist for specific tooth problems or for surgery. SEEK IMMEDIATE DENTAL CARE IF:  You have pain, bleeding, or soreness in the gum, tooth, jaw, or mouth area.  A permanent tooth becomes loose or separated from the gum socket.  You experience a blow or injury to the mouth or jaw area. Document Released: 02/14/2011 Document Revised: 08/27/2011 Document Reviewed: 02/14/2011 [facility] Patient Information 2015 [place], [place]. This information is not intended to replace advice given to you by your health care provider. Make sure you discuss any questions you have with your health care provider.

## 2015-05-03 ENCOUNTER — Encounter: Payer: Self-pay | Admitting: *Deleted

## 2015-07-24 ENCOUNTER — Encounter (HOSPITAL_COMMUNITY): Payer: Self-pay | Admitting: Emergency Medicine

## 2015-07-24 ENCOUNTER — Emergency Department (HOSPITAL_COMMUNITY)
Admission: EM | Admit: 2015-07-24 | Discharge: 2015-07-24 | Disposition: A | Discharge status: Home / Self Care | Payer: Medicaid Other | Attending: Emergency Medicine | Admitting: Emergency Medicine

## 2015-07-24 DIAGNOSIS — S60351A Superficial foreign body of right thumb, initial encounter: Secondary | ICD-10-CM | POA: Insufficient documentation

## 2015-07-24 DIAGNOSIS — J45909 Unspecified asthma, uncomplicated: Secondary | ICD-10-CM | POA: Insufficient documentation

## 2015-07-24 DIAGNOSIS — Y999 Unspecified external cause status: Secondary | ICD-10-CM | POA: Insufficient documentation

## 2015-07-24 DIAGNOSIS — S60111A Contusion of right thumb with damage to nail, initial encounter: Secondary | ICD-10-CM | POA: Insufficient documentation

## 2015-07-24 DIAGNOSIS — Z8659 Personal history of other mental and behavioral disorders: Secondary | ICD-10-CM | POA: Insufficient documentation

## 2015-07-24 DIAGNOSIS — S60459A Superficial foreign body of unspecified finger, initial encounter: Secondary | ICD-10-CM

## 2015-07-24 DIAGNOSIS — X58XXXA Exposure to other specified factors, initial encounter: Secondary | ICD-10-CM | POA: Diagnosis not present

## 2015-07-24 DIAGNOSIS — Z792 Long term (current) use of antibiotics: Secondary | ICD-10-CM | POA: Insufficient documentation

## 2015-07-24 DIAGNOSIS — G40909 Epilepsy, unspecified, not intractable, without status epilepticus: Secondary | ICD-10-CM | POA: Diagnosis not present

## 2015-07-24 DIAGNOSIS — Y9289 Other specified places as the place of occurrence of the external cause: Secondary | ICD-10-CM | POA: Diagnosis not present

## 2015-07-24 DIAGNOSIS — Z791 Long term (current) use of non-steroidal anti-inflammatories (NSAID): Secondary | ICD-10-CM | POA: Diagnosis not present

## 2015-07-24 DIAGNOSIS — Z8679 Personal history of other diseases of the circulatory system: Secondary | ICD-10-CM | POA: Diagnosis not present

## 2015-07-24 DIAGNOSIS — Y9389 Activity, other specified: Secondary | ICD-10-CM | POA: Insufficient documentation

## 2015-07-24 DIAGNOSIS — Z87891 Personal history of nicotine dependence: Secondary | ICD-10-CM | POA: Diagnosis not present

## 2015-07-24 DIAGNOSIS — S6991XA Unspecified injury of right wrist, hand and finger(s), initial encounter: Secondary | ICD-10-CM | POA: Diagnosis present

## 2015-07-24 DIAGNOSIS — Z8639 Personal history of other endocrine, nutritional and metabolic disease: Secondary | ICD-10-CM | POA: Insufficient documentation

## 2015-07-24 MED ORDER — CEPHALEXIN 500 MG PO CAPS: 500.0000 mg | ORAL_CAPSULE | Freq: Three times a day (TID) | ORAL | Status: DC

## 2015-07-24 MED ORDER — LIDOCAINE HCL (PF) 2 % IJ SOLN
10.0000 mL | Freq: Once | INTRAMUSCULAR | Status: AC
  Administered 2015-07-24: 10 mL
  Filled 2015-07-24: qty 10

## 2015-07-24 MED ORDER — CEPHALEXIN 250 MG PO CAPS
500.0000 mg | ORAL_CAPSULE | Freq: Once | ORAL | Status: AC
Start: 2015-07-24 — End: 2015-07-24
  Administered 2015-07-24: 500 mg via ORAL
  Filled 2015-07-24: qty 2

## 2015-07-24 NOTE — Discharge Instructions (Signed)
Warm soaks 2-3 times a day. Take acetaminophen or ibuprofen as needed for pain.  Sliver Removal, Care After A sliver--also called a splinter--is a small and thin broken piece of an object that gets stuck (embedded) under the skin. A sliver can create a deep wound that can easily become infected. It is important to care for the wound after a sliver is removed to help prevent infection and other problems from developing. WHAT TO EXPECT AFTER THE PROCEDURE Slivers often break into smaller pieces when they are removed. If pieces of your sliver broke off and stayed in your skin, you will eventually see them working themselves out and you may feel some pain at the wound site. This is normal. HOME CARE INSTRUCTIONS  Keep all follow-up visits as directed by your health care provider. This is important.  There are many different ways to close and cover a wound, including stitches (sutures) and adhesive strips. Follow your health care provider's instructions about:  Wound care.  Bandage (dressing) changes and removal.  Wound closure removal.  Check the wound site every day for signs of infection. Watch for:  Red streaks coming from the wound.  Fever.  Redness or tenderness around the wound.  Fluid, blood, or pus coming from the wound.  A bad smell coming from the wound. SEEK MEDICAL CARE IF:  You think that a piece of the sliver is still in your skin.  Your wound was closed, as with sutures, and the edges of the wound break open.  You have signs of infection, including:  New or worsening redness around the wound.  New or worsening tenderness around the wound.  Fluid, blood, or pus coming from the wound.  A bad smell coming from the wound or dressing. SEEK IMMEDIATE MEDICAL CARE IF: You have any of the following signs of infection:  Red streaks coming from the wound.  An unexplained fever.   This information is not intended to replace advice given to you by your health care  provider. Make sure you discuss any questions you have with your health care provider.   Document Released: 06/01/2000 Document Revised: 06/25/2014 Document Reviewed: 02/04/2014 Elsevier Interactive Patient Education 2016 [employer]  Cephalexin tablets or capsules What is this medicine? CEPHALEXIN (sef a LEX in) is a cephalosporin antibiotic. It is used to treat certain kinds of bacterial infections It will not work for colds, flu, or other viral infections. This medicine may be used for other purposes; ask your health care provider or pharmacist if you have questions. What should I tell my health care provider before I take this medicine? They need to know if you have any of these conditions: -kidney disease -stomach or intestine problems, especially colitis -an unusual or allergic reaction to cephalexin, other cephalosporins, penicillins, other antibiotics, medicines, foods, dyes or preservatives -pregnant or trying to get pregnant -breast-feeding How should I use this medicine? Take this medicine by mouth with a full glass of water. Follow the directions on the prescription label. This medicine can be taken with or without food. Take your medicine at regular intervals. Do not take your medicine more often than directed. Take all of your medicine as directed even if you think you are better. Do not skip doses or stop your medicine early. Talk to your pediatrician regarding the use of this medicine in children. While this drug may be prescribed for selected conditions, precautions do apply. Overdosage: If you think you have taken too much of this medicine contact a poison  control center or emergency room at once. NOTE: This medicine is only for you. Do not share this medicine with others. What if I miss a dose? If you miss a dose, take it as soon as you can. If it is almost time for your next dose, take only that dose. Do not take double or extra doses. There should be at least 4 to 6  hours between doses. What may interact with this medicine? -probenecid -some other antibiotics This list may not describe all possible interactions. Give your health care provider a list of all the medicines, herbs, non-prescription drugs, or dietary supplements you use. Also tell them if you smoke, drink alcohol, or use illegal drugs. Some items may interact with your medicine. What should I watch for while using this medicine? Tell your doctor or health care professional if your symptoms do not begin to improve in a few days. Do not treat diarrhea with over the counter products. Contact your doctor if you have diarrhea that lasts more than 2 days or if it is severe and watery. If you have diabetes, you may get a false-positive result for sugar in your urine. Check with your doctor or health care professional. What side effects may I notice from receiving this medicine? Side effects that you should report to your doctor or health care professional as soon as possible: -allergic reactions like skin rash, itching or hives, swelling of the face, lips, or tongue -breathing problems -pain or trouble passing urine -redness, blistering, peeling or loosening of the skin, including inside the mouth -severe or watery diarrhea -unusually weak or tired -yellowing of the eyes, skin Side effects that usually do not require medical attention (report to your doctor or health care professional if they continue or are bothersome): -gas or heartburn -genital or anal irritation -headache -joint or muscle pain -nausea, vomiting This list may not describe all possible side effects. Call your doctor for medical advice about side effects. You may report side effects to FDA at 1-800-FDA-1088. Where should I keep my medicine? Keep out of the reach of children. Store at room temperature between 59 and 86 degrees F (15 and 30 degrees C). Throw away any unused medicine after the expiration date. NOTE: This sheet is  a summary. It may not cover all possible information. If you have questions about this medicine, talk to your doctor, pharmacist, or health care provider.    2016, Elsevier/Gold Standard. (2007-09-08 17:09:13)

## 2015-07-24 NOTE — ED Provider Notes (Signed)
CSN: [id number]     Arrival date & time 07/24/15  0030 History  By signing my name below, I, Phillis Haggis, attest that this documentation has been prepared under the direction and in the presence of Dione Booze, MD. Electronically Signed: Phillis Haggis, ED Scribe. 07/24/2015. 1:30 AM.  Chief Complaint  Patient presents with  . Hand Injury   The history is provided by the patient. No language interpreter was used.  HPI Comments: Hannah Cook is a 35 y.o. female who presents to the Emergency Department complaining of constant right thumb pain onset 2 days ago. Pt states that she was cleaning and believes something got into her thumb. She states that the thumb then began to swell and become painful. She reports that her significant other cut the thumbnail and green pus began to come out of it. She denies known injury, fever, chills, diaphoresis, nausea, or vomiting.  Past Medical History  Diagnosis Date  . Asthma   . Aneurysm (HCC)   . Panic attack   . 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 Aneurysm (HCC) 2002  . Thyroid disease    Past Surgical History  Procedure Laterality Date  . Brain surgery    . Tonsillectomy    . Aneurysm coiling     Family History  Problem Relation Age of Onset  . Thyroid disease Mother   . Aortic aneurysm Mother     heart  . Diabetes Mother   . Hypertension Maternal Aunt   . Hypertension Maternal Uncle   . Hypertension Maternal Grandmother   . Diabetes Maternal Grandmother    Social History  Substance Use Topics  . Smoking status: Former Smoker -- 2.00 packs/day for 18 years    Types: Cigarettes    Quit date: 12/19/2013  . Smokeless tobacco: Never Used  . Alcohol Use: No   OB History    Gravida Para Term Preterm AB TAB SAB Ectopic Multiple Living   Review of Systems  Constitutional: Negative for fever, chills and diaphoresis.  Gastrointestinal: Negative for nausea and vomiting.   Musculoskeletal: Positive for arthralgias.  Skin: Positive for wound.  All other systems reviewed and are negative.  Allergies  Hydroxacen and Vicodin  Home Medications   Prior to Admission medications   Medication Sig Start Date End Date Taking? Authorizing Provider  amoxicillin (AMOXIL) 500 MG capsule Take 1 capsule (500 mg total) by mouth 3 (three) times daily. Patient not taking: Reported on 10/21/2014 10/10/14   Marlis Edelson, CNM  ibuprofen (ADVIL,MOTRIN) 800 MG tablet Take 1 tablet (800 mg total) by mouth 3 (three) times daily. 01/14/15   Elpidio Anis, PA-C  lamoTRIgine (LAMICTAL) 100 MG tablet Take 100 mg by mouth 2 (two) times daily.    Historical Provider, MD  Prenatal Multivit-Min-Fe-FA (PRENATAL VITAMINS) 0.8 MG tablet Take 1 tablet by mouth daily. Patient not taking: Reported on 10/26/2014 10/21/14   Adam Phenix, MD  promethazine (PHENERGAN) 25 MG tablet Take 1 tablet (25 mg total) by mouth every 6 (six) hours as needed for nausea or vomiting. Patient not taking: Reported on 10/26/2014 10/21/14   Adam Phenix, MD  trimethoprim-polymyxin b [facility]) ophthalmic solution Place 2 drops into the left eye every 4 (four) hours. 12/15/14   Heather Laisure, PA-C   BP 128/76 mmHg  Pulse 89  Temp(Src) 98.9 F (37.2 C)  Resp 16  SpO2 98%  LMP 07/23/2014 (Approximate) Physical Exam  Constitutional: She is oriented to person, place, and time. She appears well-developed and well-nourished.  HENT:  Head: Normocephalic and atraumatic.  Eyes: EOM are normal. Pupils are equal, round, and reactive to light.  Neck: Normal range of motion. Neck supple. No JVD present.  Cardiovascular: Normal rate, regular rhythm and normal heart sounds.  Exam reveals no gallop and no friction rub.   No murmur heard. Pulmonary/Chest: Effort normal and breath sounds normal. She has no wheezes. She has no rales. She exhibits no tenderness.  Abdominal: Soft. Bowel sounds are normal. She exhibits no distension  and no mass. There is no tenderness.  Musculoskeletal: Normal range of motion.  Mild induration to tip of right thumb with moderate tenderness; 2 mm object visible beneath the right first thumbnail distally  Lymphadenopathy:    She has no cervical adenopathy.  Neurological: She is alert and oriented to person, place, and time. No cranial nerve deficit. She exhibits normal muscle tone. Coordination normal.  Skin: Skin is warm and dry. No rash noted.  Psychiatric: She has a normal mood and affect. Her behavior is normal. Judgment and thought content normal.  Nursing note and vitals reviewed.   ED Course  .Foreign Body Removal Date/Time: 07/24/2015 2:40 AM Performed by: Dione Booze Authorized by: Preston Fleeting, Jozalynn Noyce Consent: Verbal consent obtained. Written consent not obtained. Risks and benefits: risks, benefits and alternatives were discussed Consent given by: patient Patient understanding: patient states understanding of the procedure being performed Patient consent: the patient's understanding of the procedure matches consent given Procedure consent: procedure consent matches procedure scheduled Relevant documents: relevant documents present and verified Site marked: the operative site was marked Required items: required blood products, implants, devices, and special equipment available Patient identity confirmed: verbally with patient and arm band Time out: Immediately prior to procedure a "time out" was called to verify the correct patient, procedure, equipment, support staff and site/side marked as required. Intake: subungual right thumb. Anesthesia: digital block Local anesthetic: lidocaine 2% without epinephrine Anesthetic total: 8 ml Patient sedated: no Patient restrained: no Complexity: simple 1 objects recovered. Objects recovered: sliver Post-procedure assessment: foreign body removed Patient tolerance: Patient tolerated the procedure well with no immediate  complications Comments: Object removed with blade of scissors.   (including critical care time) DIAGNOSTIC STUDIES: Oxygen Saturation is 98% on RA, normal by my interpretation.    COORDINATION OF CARE: 1:30 AM-Discussed treatment plan which includes digital block with pt at bedside and pt agreed to plan.    MDM   Final diagnoses:  Subungual foreign body of finger, initial encounter    Subungual foreign body right thumb. After digital block, this is removed easily. She does have some low-grade infection in his eyes to so. Warm water several times a day and she is put on a 5 day course of cephalexin. Referred to hand surgery if she has problems following this.  I personally performed the services described in this documentation, which was scribed in my presence. The recorded information has been reviewed and is accurate.      Dione Booze, MD 07/24/15 [phone]

## 2015-07-24 NOTE — ED Notes (Signed)
Patient able to ambulate independently  

## 2015-07-24 NOTE — ED Notes (Signed)
Pt states that she believes she stuck her right thumb with a piece of wood a few days ago. Thumb has become infected with pus coming out of it.

## 2015-08-19 ENCOUNTER — Emergency Department (HOSPITAL_COMMUNITY)
Admission: EM | Admit: 2015-08-19 | Discharge: 2015-08-19 | Disposition: A | Discharge status: Home / Self Care | Payer: Medicaid Other

## 2015-08-20 ENCOUNTER — Emergency Department (HOSPITAL_COMMUNITY)
Admission: EM | Admit: 2015-08-20 | Discharge: 2015-08-20 | Disposition: A | Discharge status: Home / Self Care | Payer: Medicaid Other | Attending: Emergency Medicine | Admitting: Emergency Medicine

## 2015-08-20 ENCOUNTER — Encounter (HOSPITAL_COMMUNITY): Payer: Self-pay | Admitting: Emergency Medicine

## 2015-08-20 DIAGNOSIS — Z3202 Encounter for pregnancy test, result negative: Secondary | ICD-10-CM | POA: Insufficient documentation

## 2015-08-20 DIAGNOSIS — F410 Panic disorder [episodic paroxysmal anxiety] without agoraphobia: Secondary | ICD-10-CM | POA: Insufficient documentation

## 2015-08-20 DIAGNOSIS — Z8679 Personal history of other diseases of the circulatory system: Secondary | ICD-10-CM | POA: Insufficient documentation

## 2015-08-20 DIAGNOSIS — Z87891 Personal history of nicotine dependence: Secondary | ICD-10-CM | POA: Insufficient documentation

## 2015-08-20 DIAGNOSIS — A084 Viral intestinal infection, unspecified: Secondary | ICD-10-CM | POA: Diagnosis not present

## 2015-08-20 DIAGNOSIS — Z79899 Other long term (current) drug therapy: Secondary | ICD-10-CM | POA: Diagnosis not present

## 2015-08-20 DIAGNOSIS — Z8639 Personal history of other endocrine, nutritional and metabolic disease: Secondary | ICD-10-CM | POA: Diagnosis not present

## 2015-08-20 DIAGNOSIS — J45909 Unspecified asthma, uncomplicated: Secondary | ICD-10-CM | POA: Insufficient documentation

## 2015-08-20 DIAGNOSIS — R1084 Generalized abdominal pain: Secondary | ICD-10-CM | POA: Diagnosis present

## 2015-08-20 LAB — COMPREHENSIVE METABOLIC PANEL
ALK PHOS: 86 U/L (ref 38–126)
ALT: 17 U/L (ref 14–54)
AST: 22 U/L (ref 15–41)
Albumin: 4.1 g/dL (ref 3.5–5.0)
Anion gap: 8 (ref 5–15)
BUN: 8 mg/dL (ref 6–20)
CALCIUM: 9.2 mg/dL (ref 8.9–10.3)
CHLORIDE: 105 mmol/L (ref 101–111)
CO2: 25 mmol/L (ref 22–32)
CREATININE: 0.75 mg/dL (ref 0.44–1.00)
Glucose, Bld: 90 mg/dL (ref 65–99)
Potassium: 3.2 mmol/L — ABNORMAL LOW (ref 3.5–5.1)
SODIUM: 138 mmol/L (ref 135–145)
Total Bilirubin: 0.8 mg/dL (ref 0.3–1.2)
Total Protein: 7.6 g/dL (ref 6.5–8.1)

## 2015-08-20 LAB — URINALYSIS, ROUTINE W REFLEX MICROSCOPIC
GLUCOSE, UA: NEGATIVE mg/dL
HGB URINE DIPSTICK: NEGATIVE
KETONES UR: NEGATIVE mg/dL
Leukocytes, UA: NEGATIVE
Nitrite: NEGATIVE
PROTEIN: NEGATIVE mg/dL
Specific Gravity, Urine: 1.028 (ref 1.005–1.030)
pH: 6 (ref 5.0–8.0)

## 2015-08-20 LAB — CBC
HCT: 42.4 % (ref 36.0–46.0)
Hemoglobin: 14.8 g/dL (ref 12.0–15.0)
MCH: 32 pg (ref 26.0–34.0)
MCHC: 34.9 g/dL (ref 30.0–36.0)
MCV: 91.8 fL (ref 78.0–100.0)
PLATELETS: 278 10*3/uL (ref 150–400)
RBC: 4.62 MIL/uL (ref 3.87–5.11)
RDW: 12.7 % (ref 11.5–15.5)
WBC: 7.3 10*3/uL (ref 4.0–10.5)

## 2015-08-20 LAB — I-STAT BETA HCG BLOOD, ED (MC, WL, AP ONLY)

## 2015-08-20 LAB — LIPASE, BLOOD: LIPASE: 21 U/L (ref 11–51)

## 2015-08-20 MED ORDER — ONDANSETRON HCL 4 MG PO TABS: 4.0000 mg | ORAL_TABLET | Freq: Four times a day (QID) | ORAL | Status: DC

## 2015-08-20 MED ORDER — DICYCLOMINE HCL 10 MG PO CAPS
10.0000 mg | ORAL_CAPSULE | Freq: Once | ORAL | Status: AC
  Administered 2015-08-20: 10 mg via ORAL
  Filled 2015-08-20: qty 1

## 2015-08-20 MED ORDER — LOPERAMIDE HCL 2 MG PO CAPS: 2.0000 mg | ORAL_CAPSULE | Freq: Four times a day (QID) | ORAL | Status: DC | PRN

## 2015-08-20 MED ORDER — DICYCLOMINE HCL 20 MG PO TABS: 20.0000 mg | ORAL_TABLET | Freq: Two times a day (BID) | ORAL | Status: DC

## 2015-08-20 MED ORDER — POTASSIUM CHLORIDE CRYS ER 20 MEQ PO TBCR
40.0000 meq | EXTENDED_RELEASE_TABLET | Freq: Once | ORAL | Status: AC
  Administered 2015-08-20: 40 meq via ORAL
  Filled 2015-08-20: qty 2

## 2015-08-20 MED ORDER — ONDANSETRON 4 MG PO TBDP
4.0000 mg | ORAL_TABLET | Freq: Once | ORAL | Status: AC
  Administered 2015-08-20: 4 mg via ORAL
  Filled 2015-08-20: qty 1

## 2015-08-20 NOTE — Discharge Instructions (Signed)

## 2015-08-20 NOTE — ED Notes (Signed)
Pt. Was given water and crackers, along with a [place] sandwich.  Pt. Ate a few bites and her abdominal pain returned.  ASked her not to eat and will reevaluate . Pt. Denies any nausea

## 2015-08-20 NOTE — ED Provider Notes (Signed)
  I saw and evaluated the patient, reviewed the resident's note and I agree with the findings and plan.   EKG Interpretation None         Patient presenting with complaint of abdominal pain. Associated with nausea vomiting and diarrhea. No blood in the vomit. States because stools were black colored yesterday but they've been normal today. Having about 30 loose bowel movements a day.  Results for orders placed or performed during the hospital encounter of 08/20/15  Lipase, blood  Result Value Ref Range   Lipase 21 11 - 51 U/L  Comprehensive metabolic panel  Result Value Ref Range   Sodium 138 135 - 145 mmol/L   Potassium 3.2 (L) 3.5 - 5.1 mmol/L   Chloride 105 101 - 111 mmol/L   CO2 25 22 - 32 mmol/L   Glucose, Bld 90 65 - 99 mg/dL   BUN 8 6 - 20 mg/dL   Creatinine, Ser 1.610.75 0.44 - 1.00 mg/dL   Calcium 9.2 8.9 - 09.610.3 mg/dL   Total Protein 7.6 6.5 - 8.1 g/dL   Albumin 4.1 3.5 - 5.0 g/dL   AST 22 15 - 41 U/L   ALT 17 14 - 54 U/L   Alkaline Phosphatase 86 38 - 126 U/L   Total Bilirubin 0.8 0.3 - 1.2 mg/dL   GFR calc non Af Amer >60 >60 mL/min   GFR calc Af Amer >60 >60 mL/min   Anion gap 8 5 - 15  CBC  Result Value Ref Range   WBC 7.3 4.0 - 10.5 K/uL   RBC 4.62 3.87 - 5.11 MIL/uL   Hemoglobin 14.8 12.0 - 15.0 g/dL   HCT 04.542.4 40.936.0 - 81.146.0 %   MCV 91.8 78.0 - 100.0 fL   MCH 32.0 26.0 - 34.0 pg   MCHC 34.9 30.0 - 36.0 g/dL   RDW 91.412.7 78.211.5 - 95.615.5 %   Platelets 278 150 - 400 K/uL  Urinalysis, Routine w reflex microscopic ([facility])  Result Value Ref Range   Color, Urine AMBER (A) YELLOW   APPearance CLEAR CLEAR   Specific Gravity, Urine 1.028 1.005 - 1.030   pH 6.0 5.0 - 8.0   Glucose, UA NEGATIVE NEGATIVE mg/dL   Hgb urine dipstick NEGATIVE NEGATIVE   Bilirubin Urine SMALL (A) NEGATIVE   Ketones, ur NEGATIVE NEGATIVE mg/dL   Protein, ur NEGATIVE NEGATIVE mg/dL   Nitrite NEGATIVE NEGATIVE   Leukocytes, UA NEGATIVE NEGATIVE  I-Stat beta hCG blood, ED ([facility], [facility], [facility]  only)  Result Value Ref Range   I-stat hCG, quantitative <5.0 <5 mIU/mL   Comment 3           Labs without any significant abnormalities. Patient's abdomen is soft nontender no acute abdominal process. Patient was very concerned that maybe something more serious going offered her a CT scan of her abdomen to evaluate things further she denies wanting this test. I made it clear that we have some unknowns without that. Patient understands that she'll follow-up with her regular doctor. Patient nontoxic no acute distress.  The patient has been on over-the-counter Imodium. I will going give her a prescription for this to continue in follow-up with her doctor. Patient will return for any new or worse symptoms. Will return for black stools. Will return for red blood per rectum or in the vomit.  Vanetta MuldersScott Rainna Nearhood, MD 08/20/15 2002

## 2015-08-20 NOTE — ED Provider Notes (Signed)
CSN: [id number]     Arrival date & time 08/20/15  1621 History   First MD Initiated Contact with Patient 08/20/15 1747     Chief Complaint  Patient presents with  . Abdominal Pain  . Melena   Patient is a 35 y.o. female presenting with abdominal pain. The history is provided by the patient. No language interpreter was used.  Abdominal Pain Pain location:  Generalized Pain quality: aching   Pain radiates to:  Does not radiate Pain severity:  Moderate Onset quality:  Gradual Duration:  4 days Timing:  Intermittent Progression:  Unchanged Chronicity:  New Context: sick contacts   Context: not alcohol use and not previous surgeries   Relieved by:  None tried Worsened by:  Nothing tried Ineffective treatments:  None tried Associated symptoms: diarrhea and nausea   Associated symptoms: no chest pain, no chills, no constipation, no cough, no dysuria, no fever, no hematuria, no melena, no shortness of breath, no sore throat, no vaginal bleeding, no vaginal discharge and no vomiting   Risk factors: obesity   Risk factors: has not had multiple surgeries     Past Medical History  Diagnosis Date  . Asthma   . Aneurysm (HCC)   . Panic attack   . 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 Aneurysm (HCC) 2002  . Thyroid disease    Past Surgical History  Procedure Laterality Date  . Brain surgery    . Tonsillectomy    . Aneurysm coiling     Family History  Problem Relation Age of Onset  . Thyroid disease Mother   . Aortic aneurysm Mother     heart  . Diabetes Mother   . Hypertension Maternal Aunt   . Hypertension Maternal Uncle   . Hypertension Maternal Grandmother   . Diabetes Maternal Grandmother    Social History  Substance Use Topics  . Smoking status: Former Smoker -- 2.00 packs/day for 18 years    Types: Cigarettes    Quit date: 12/19/2013  . Smokeless tobacco: Never Used  . Alcohol Use: No   OB History    Gravida Para Term Preterm AB TAB  SAB Ectopic Multiple Living   Review of Systems  Constitutional: Negative for fever, chills, activity change and appetite change.  HENT: Negative for congestion, dental problem, ear pain, facial swelling, hearing loss, rhinorrhea, sneezing, sore throat, trouble swallowing and voice change.   Eyes: Negative for photophobia, pain, redness and visual disturbance.  Respiratory: Negative for apnea, cough, chest tightness, shortness of breath, wheezing and stridor.   Cardiovascular: Negative for chest pain, palpitations and leg swelling.  Gastrointestinal: Positive for nausea, abdominal pain and diarrhea. Negative for vomiting, constipation, blood in stool, melena and abdominal distention.  Endocrine: Negative for polydipsia and polyuria.  Genitourinary: Negative for dysuria, frequency, hematuria, flank pain, decreased urine volume, vaginal bleeding, vaginal discharge and difficulty urinating.  Musculoskeletal: Negative for back pain, joint swelling, gait problem, neck pain and neck stiffness.  Skin: Negative for rash and wound.  Allergic/Immunologic: Negative for immunocompromised state.  Neurological: Negative for dizziness, syncope, facial asymmetry, speech difficulty, weakness, light-headedness, numbness and headaches.  Hematological: Negative for adenopathy.  Psychiatric/Behavioral: Negative for suicidal ideas, behavioral problems, confusion, sleep disturbance and agitation. The patient is not nervous/anxious.   All other systems reviewed and are negative.     Allergies  Hydroxacen and Vicodin  Home Medications   Prior to Admission medications   Medication Sig Start Date End Date Taking? Authorizing Provider  lamoTRIgine (LAMICTAL) 100 MG tablet Take 100 mg by mouth 2 (two) times daily.   Yes Historical Provider, MD  dicyclomine (BENTYL) 20 MG tablet Take 1 tablet (20 mg total) by mouth 2 (two) times daily. 08/20/15   Dan HumphreysMichael Ameyah Bangura, MD  ibuprofen (ADVIL,MOTRIN) 800  MG tablet Take 1 tablet (800 mg total) by mouth 3 (three) times daily. Patient not taking: Reported on 08/20/2015 01/14/15   Elpidio AnisShari Upstill, PA-C  loperamide (IMODIUM) 2 MG capsule Take 1 capsule (2 mg total) by mouth 4 (four) times daily as needed for diarrhea or loose stools. 08/20/15   Dan HumphreysMichael Katharine Rochefort, MD  ondansetron (ZOFRAN) 4 MG tablet Take 1 tablet (4 mg total) by mouth every 6 (six) hours. 08/20/15   Dan HumphreysMichael Satish Hammers, MD   BP 113/79 mmHg  Pulse 93  Temp(Src) 98.2 F (36.8 C) (Oral)  Resp 18  Ht 5\' 6"  (1.676 m)  Wt 85.531 kg  BMI 30.45 kg/m2  SpO2 100%  LMP 07/24/2015  Breastfeeding? No Physical Exam  Constitutional: She is oriented to person, place, and time. She appears well-developed and well-nourished. No distress.  HENT:  Head: Normocephalic and atraumatic.  Right Ear: External ear normal.  Left Ear: External ear normal.  Eyes: Pupils are equal, round, and reactive to light. Right eye exhibits no discharge. Left eye exhibits no discharge.  Neck: Normal range of motion. No JVD present. No tracheal deviation present.  Cardiovascular: Normal rate, regular rhythm and normal heart sounds.  Exam reveals no friction rub.   No murmur heard. Pulmonary/Chest: Effort normal and breath sounds normal. No stridor. No respiratory distress. She has no wheezes.  Abdominal: Soft. Bowel sounds are normal. She exhibits no distension. There is no tenderness. There is no rebound and no guarding.  Musculoskeletal: Normal range of motion. She exhibits no edema or tenderness.  Lymphadenopathy:    She has no cervical adenopathy.  Neurological: She is alert and oriented to person, place, and time. No cranial nerve deficit. Coordination normal.  Skin: Skin is warm and dry. No rash noted. No pallor.  Psychiatric: She has a normal mood and affect. Her behavior is normal. Judgment and thought content normal.  Nursing note and vitals reviewed.   ED Course  Procedures (including critical care time) Labs  Review Labs Reviewed  COMPREHENSIVE METABOLIC PANEL - Abnormal; Notable for the following:    Potassium 3.2 (*)    All other components within normal limits  URINALYSIS, ROUTINE W REFLEX MICROSCOPIC (NOT [facility]) - Abnormal; Notable for the following:    Color, Urine AMBER (*)    Bilirubin Urine SMALL (*)    All other components within normal limits  LIPASE, BLOOD  CBC  I-STAT BETA HCG BLOOD, ED ([facility], [facility], [facility] ONLY)    Imaging Review No results found. I have personally reviewed and evaluated these images and lab results as part of my medical decision-making.   EKG Interpretation None      MDM   Final diagnoses:  Viral gastroenteritis   Patient with 4 days of nausea, vomiting, diarrhea. Patient with diffuse crampy abdominal pain as well.  Patient with no significant past medical history. Patient afebrile no acute distress. She has a normal blood pressure. Heart rate along the room is between 80s and 90s. Abdomen is soft and nontender. She is a sick contact at home with GI symptoms. Pregnant test negative, UA negative  for UTI. CMP with mildly low potassium at 3.2, replaced in ED. Lipase normal CBC normal. Patient  Patient given Bentyl, Zofran. Patient declines IV or IV fluids. She states that she will be okay with by mouth hydration.  Able tolerate oral intake. She requested and received Percocet and was able tolerate in the ED. She is not happy with not having a completely firm diagnosis at this time. She was offered a CT abdomen pelvis which she has declined. She will follow with her PCP.  Patient prescribed Imodium, Zofran, Bentyl in ED and encouraged to maintain good yelling take over the next couple days.  Patient encouraged to return for any new or concerning symptoms.  Discussed case with my attending, Dr. Deretha Emory.      Dan Humphreys, MD 08/20/15 2006

## 2015-08-20 NOTE — ED Notes (Signed)
Pt. Has been stuck three times for lab draws.  Pt. Has requested IV nurse, if she will require an IV for treatment

## 2015-08-20 NOTE — ED Notes (Signed)
Pt c/o abdominal pain with N/V/D. Pt reports black colored stools. Pt denies blood in vomit.

## 2015-11-02 ENCOUNTER — Emergency Department (HOSPITAL_COMMUNITY)
Admission: EM | Admit: 2015-11-02 | Discharge: 2015-11-02 | Disposition: A | Discharge status: Home / Self Care | Payer: Medicaid Other | Attending: Emergency Medicine | Admitting: Emergency Medicine

## 2015-11-02 ENCOUNTER — Encounter (HOSPITAL_COMMUNITY): Payer: Self-pay | Admitting: Emergency Medicine

## 2015-11-02 DIAGNOSIS — F17210 Nicotine dependence, cigarettes, uncomplicated: Secondary | ICD-10-CM | POA: Diagnosis not present

## 2015-11-02 DIAGNOSIS — Z79899 Other long term (current) drug therapy: Secondary | ICD-10-CM | POA: Diagnosis not present

## 2015-11-02 DIAGNOSIS — S39012A Strain of muscle, fascia and tendon of lower back, initial encounter: Secondary | ICD-10-CM | POA: Diagnosis not present

## 2015-11-02 DIAGNOSIS — Y998 Other external cause status: Secondary | ICD-10-CM | POA: Diagnosis not present

## 2015-11-02 DIAGNOSIS — Y9289 Other specified places as the place of occurrence of the external cause: Secondary | ICD-10-CM | POA: Diagnosis not present

## 2015-11-02 DIAGNOSIS — Z8679 Personal history of other diseases of the circulatory system: Secondary | ICD-10-CM | POA: Diagnosis not present

## 2015-11-02 DIAGNOSIS — Y9389 Activity, other specified: Secondary | ICD-10-CM | POA: Diagnosis not present

## 2015-11-02 DIAGNOSIS — J45901 Unspecified asthma with (acute) exacerbation: Secondary | ICD-10-CM | POA: Insufficient documentation

## 2015-11-02 DIAGNOSIS — Z8639 Personal history of other endocrine, nutritional and metabolic disease: Secondary | ICD-10-CM | POA: Insufficient documentation

## 2015-11-02 DIAGNOSIS — S3992XA Unspecified injury of lower back, initial encounter: Secondary | ICD-10-CM | POA: Diagnosis present

## 2015-11-02 DIAGNOSIS — X58XXXA Exposure to other specified factors, initial encounter: Secondary | ICD-10-CM | POA: Diagnosis not present

## 2015-11-02 DIAGNOSIS — F410 Panic disorder [episodic paroxysmal anxiety] without agoraphobia: Secondary | ICD-10-CM | POA: Insufficient documentation

## 2015-11-02 MED ORDER — IBUPROFEN 400 MG PO TABS: 800.0000 mg | ORAL_TABLET | Freq: Once | ORAL | Status: DC

## 2015-11-02 MED ORDER — IBUPROFEN 800 MG PO TABS: 800.0000 mg | ORAL_TABLET | Freq: Three times a day (TID) | ORAL | Status: DC | PRN

## 2015-11-02 NOTE — Discharge Instructions (Signed)

## 2015-11-02 NOTE — ED Notes (Signed)
Patient states low back pain that started last night.   Patient denies injury of any kind.  Patient denies urinary symptoms.   Patient states took tylenol and motrin for pain with no relief.

## 2015-11-02 NOTE — ED Provider Notes (Signed)
History  By signing my name below, I, Hannah Cook, attest that this documentation has been prepared under the direction and in the presence of Hannah Cook Vonzella Althaus, PA-C. Electronically Signed: Earmon PhoenixJennifer Cook, ED Scribe. 11/02/2015. 10:42 AM.  Chief Complaint  Patient presents with  . Back Pain   The history is provided by the patient and medical records. No language interpreter was used.    HPI Comments:  Hannah Cook is a 35 y.o. female who presents to the Emergency Department complaining of waxing and waning, sharp, stabbing lower back pain that began last night. She states the pain is starting to move upward. She has taken Tylenol and Motrin for pain with minimal relief of the pain. Movement increases the pain. Resting helps to alleviate the pain. She denies fever, chills, bowel or bladder incontinence, numbness, tingling or weakness of the lower extremities, dysuria, hematuria, saddle anesthesia, abdominal pain, nausea, vomiting. She denies any heavy lifting. She denies any trauma, injury or fall. She denies h/o IV drug use or cancer. She is ambulatory without issue. She reports having back pain in the past. She states she cannot take muscle relaxants because they make her itch and nervous.  Past Medical History  Diagnosis Date  . Asthma   . Aneurysm (HCC)   . Panic attack   . 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 Aneurysm (HCC) 2002  . Thyroid disease    Past Surgical History  Procedure Laterality Date  . Brain surgery    . Tonsillectomy    . Aneurysm coiling     Family History  Problem Relation Age of Onset  . Thyroid disease Mother   . Aortic aneurysm Mother     heart  . Diabetes Mother   . Hypertension Maternal Aunt   . Hypertension Maternal Uncle   . Hypertension Maternal Grandmother   . Diabetes Maternal Grandmother    Social History  Substance Use Topics  . Smoking status: Current Every Day Smoker -- 1.00 packs/day for 18 years   Types: Cigarettes  . Smokeless tobacco: Never Used  . Alcohol Use: No   OB History    Gravida Para Term Preterm AB TAB SAB Ectopic Multiple Living   5 3 3  1  1   3      Review of Systems  Musculoskeletal: Positive for back pain.    Allergies  Hydroxacen and Vicodin  Home Medications   Prior to Admission medications   Medication Sig Start Date End Date Taking? Authorizing Provider  dicyclomine (BENTYL) 20 MG tablet Take 1 tablet (20 mg total) by mouth 2 (two) times daily. 08/20/15   Dan HumphreysMichael Irick, MD  ibuprofen (ADVIL,MOTRIN) 800 MG tablet Take 1 tablet (800 mg total) by mouth every 8 (eight) hours as needed for moderate pain. 11/02/15   Hannah Cook Tinamarie Przybylski, PA-C  lamoTRIgine (LAMICTAL) 100 MG tablet Take 100 mg by mouth 2 (two) times daily.    Historical Provider, MD  loperamide (IMODIUM) 2 MG capsule Take 1 capsule (2 mg total) by mouth 4 (four) times daily as needed for diarrhea or loose stools. 08/20/15   Dan HumphreysMichael Irick, MD  ondansetron (ZOFRAN) 4 MG tablet Take 1 tablet (4 mg total) by mouth every 6 (six) hours. 08/20/15   Dan HumphreysMichael Irick, MD   Triage Vitals: BP 116/84 mmHg  Pulse 84  Temp(Src) 98.6 F (37 C) (Oral)  Resp 20  SpO2 99% Physical Exam  Constitutional: She is oriented to person, place, and  time. She appears well-developed and well-nourished.  HENT:  Head: Normocephalic and atraumatic.  Eyes: EOM are normal.  Neck: Normal range of motion.  Cardiovascular: Normal rate.   Pulmonary/Chest: Effort normal.  Abdominal: There is no CVA tenderness.  No CVA tenderness.  Musculoskeletal: She exhibits tenderness.  No significant midline spinal tenderness, crepitus or step off. Tenderness to palpation noted to lumbar bilateral lumbar paraspinal muscles. Decreased lateral rotation. Normal gait. Bilateral lower extremities without palpable cord. No edema. Normal skin tone and skin color.  Neurological: She is alert and oriented to person, place, and time.  Intact patellar DTR.  Skin:  Skin is warm and dry.  Psychiatric: She has a normal mood and affect. Her behavior is normal.  Nursing note and vitals reviewed.   ED Course  Procedures (including critical care time) DIAGNOSTIC STUDIES: Oxygen Saturation is 99% on RA, normal by my interpretation.   COORDINATION OF CARE: 10:39 AM- suspect MSK pain. No red flags.  Offered muscle relaxer but pt declined stating they make her itchy and nervous. Will prescribe Motrin and advised pt to continue taking OTC Tylenol. Will give referral to orthopedist. Pt verbalizes understanding and agrees to plan.  Medications  ibuprofen (ADVIL,MOTRIN) tablet 800 mg (not administered)    MDM   Final diagnoses:  Low back strain, initial encounter    BP 116/84 mmHg  Pulse 84  Temp(Src) 98.6 F (37 C) (Oral)  Resp 20  SpO2 99%   I personally performed the services described in this documentation, which was scribed in my presence. The recorded information has been reviewed and is accurate.       Hannah Helper, PA-C 11/02/15 1043  Melene Plan, DO 11/02/15 1313

## 2015-12-09 ENCOUNTER — Encounter (HOSPITAL_COMMUNITY): Payer: Self-pay | Admitting: Emergency Medicine

## 2015-12-09 ENCOUNTER — Ambulatory Visit (HOSPITAL_COMMUNITY)
Admission: EM | Admit: 2015-12-09 | Discharge: 2015-12-09 | Disposition: A | Discharge status: Home / Self Care | Payer: Medicaid Other | Attending: Family Medicine | Admitting: Family Medicine

## 2015-12-09 DIAGNOSIS — S8002XA Contusion of left knee, initial encounter: Secondary | ICD-10-CM | POA: Diagnosis not present
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
## 2015-12-09 NOTE — Discharge Instructions (Signed)
Ice and advil for soreness, return as needed.
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
## 2016-04-22 ENCOUNTER — Emergency Department (HOSPITAL_COMMUNITY): Payer: Medicaid Other

## 2016-04-22 ENCOUNTER — Emergency Department (HOSPITAL_COMMUNITY)
Admission: EM | Admit: 2016-04-22 | Discharge: 2016-04-22 | Disposition: A | Discharge status: Home / Self Care | Payer: Medicaid Other | Attending: Emergency Medicine | Admitting: Emergency Medicine

## 2016-04-22 ENCOUNTER — Encounter (HOSPITAL_COMMUNITY): Payer: Self-pay | Admitting: Emergency Medicine

## 2016-04-22 DIAGNOSIS — Y999 Unspecified external cause status: Secondary | ICD-10-CM | POA: Diagnosis not present

## 2016-04-22 DIAGNOSIS — R519 Headache, unspecified: Secondary | ICD-10-CM

## 2016-04-22 DIAGNOSIS — Y92410 Unspecified street and highway as the place of occurrence of the external cause: Secondary | ICD-10-CM | POA: Diagnosis not present

## 2016-04-22 DIAGNOSIS — S199XXA Unspecified injury of neck, initial encounter: Secondary | ICD-10-CM | POA: Diagnosis present

## 2016-04-22 DIAGNOSIS — M25512 Pain in left shoulder: Secondary | ICD-10-CM | POA: Diagnosis not present

## 2016-04-22 DIAGNOSIS — J45909 Unspecified asthma, uncomplicated: Secondary | ICD-10-CM | POA: Insufficient documentation

## 2016-04-22 DIAGNOSIS — Y939 Activity, unspecified: Secondary | ICD-10-CM | POA: Diagnosis not present

## 2016-04-22 DIAGNOSIS — F17210 Nicotine dependence, cigarettes, uncomplicated: Secondary | ICD-10-CM | POA: Diagnosis not present

## 2016-04-22 DIAGNOSIS — Z79899 Other long term (current) drug therapy: Secondary | ICD-10-CM | POA: Insufficient documentation

## 2016-04-22 DIAGNOSIS — R51 Headache: Secondary | ICD-10-CM | POA: Diagnosis not present

## 2016-04-22 DIAGNOSIS — S161XXA Strain of muscle, fascia and tendon at neck level, initial encounter: Secondary | ICD-10-CM | POA: Diagnosis not present

## 2016-04-22 MED ORDER — NAPROXEN 375 MG PO TABS: 375.0000 mg | ORAL_TABLET | Freq: Two times a day (BID) | ORAL | 0 refills | Status: DC

## 2016-04-22 MED ORDER — ACETAMINOPHEN 325 MG PO TABS
650.0000 mg | ORAL_TABLET | Freq: Once | ORAL | Status: AC
  Administered 2016-04-22: 650 mg via ORAL
  Filled 2016-04-22: qty 2
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
## 2016-04-22 NOTE — Discharge Instructions (Signed)
All of your imaging has been normal today. He may take the naproxen as needed for pain. He may also alternate Tylenol for pain. Please do not take Motrin on top of the naproxen.Standing under a hot shower can help with muscle pain. You may also use a heating pad. If symptoms do not improve in the next 5-7 days follow up with pcp. Watch out for signs of concussion including worsening headache, worsening vision changes, nausea, vomiting or sensitivity to light. Return to the ED if any new symptoms develop.
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
## 2016-08-14 ENCOUNTER — Ambulatory Visit (INDEPENDENT_AMBULATORY_CARE_PROVIDER_SITE_OTHER): Payer: Medicaid Other

## 2016-08-14 ENCOUNTER — Encounter: Payer: Self-pay | Admitting: Family Medicine

## 2016-08-14 DIAGNOSIS — Z3201 Encounter for pregnancy test, result positive: Secondary | ICD-10-CM

## 2016-08-14 LAB — POCT PREGNANCY, URINE: PREG TEST UR: POSITIVE — AB

## 2016-08-14 NOTE — Progress Notes (Signed)
Patient presented to office today from [facility] for a pregnancy test. Test confirms she is pregnant. LMP is unknown at this time. Patient thinks she had one the beginning of the month. Medication were reviewed at this time. Patient has been a letter of verification. She will follow up with her OB of choice.

## 2016-10-15 LAB — OB RESULTS CONSOLE ABO/RH: RH TYPE: POSITIVE

## 2016-10-15 LAB — OB RESULTS CONSOLE VARICELLA ZOSTER ANTIBODY, IGG: Varicella: IMMUNE

## 2016-10-15 LAB — OB RESULTS CONSOLE GC/CHLAMYDIA
Chlamydia: NEGATIVE
Gonorrhea: NEGATIVE

## 2016-10-15 LAB — OB RESULTS CONSOLE HGB/HCT, BLOOD
HCT: 43 %
HEMOGLOBIN: 14.1 g/dL

## 2016-10-15 LAB — SICKLE CELL SCREEN: Sickle Cell Screen: NORMAL

## 2016-10-15 LAB — OB RESULTS CONSOLE HIV ANTIBODY (ROUTINE TESTING): HIV: NONREACTIVE

## 2016-10-15 LAB — OB RESULTS CONSOLE RUBELLA ANTIBODY, IGM: Rubella: IMMUNE

## 2016-10-15 LAB — OB RESULTS CONSOLE HEPATITIS B SURFACE ANTIGEN: Hepatitis B Surface Ag: NEGATIVE

## 2016-10-15 LAB — OB RESULTS CONSOLE RPR: RPR: NONREACTIVE

## 2016-10-15 LAB — OB RESULTS CONSOLE PLATELET COUNT: Platelets: 285 10*3/uL

## 2016-10-15 LAB — OB RESULTS CONSOLE ANTIBODY SCREEN: ANTIBODY SCREEN: NEGATIVE

## 2016-10-15 LAB — CYSTIC FIBROSIS DIAGNOSTIC STUDY: Interpretation-CFDNA:: NEGATIVE

## 2016-10-16 ENCOUNTER — Other Ambulatory Visit (HOSPITAL_COMMUNITY): Payer: Self-pay | Admitting: Nurse Practitioner

## 2016-10-16 ENCOUNTER — Encounter: Payer: Self-pay | Admitting: Obstetrics and Gynecology

## 2016-10-16 DIAGNOSIS — Z3682 Encounter for antenatal screening for nuchal translucency: Secondary | ICD-10-CM

## 2016-10-16 DIAGNOSIS — O09521 Supervision of elderly multigravida, first trimester: Secondary | ICD-10-CM

## 2016-10-25 ENCOUNTER — Encounter (HOSPITAL_COMMUNITY): Payer: Self-pay | Admitting: *Deleted

## 2016-10-26 ENCOUNTER — Ambulatory Visit (HOSPITAL_COMMUNITY)
Admission: RE | Admit: 2016-10-26 | Discharge: 2016-10-26 | Disposition: A | Discharge status: Home / Self Care | Payer: Medicaid Other | Source: Ambulatory Visit | Attending: Nurse Practitioner | Admitting: Nurse Practitioner

## 2016-10-26 ENCOUNTER — Other Ambulatory Visit (HOSPITAL_COMMUNITY): Payer: Self-pay | Admitting: *Deleted

## 2016-10-26 ENCOUNTER — Encounter (HOSPITAL_COMMUNITY): Payer: Self-pay

## 2016-10-26 ENCOUNTER — Other Ambulatory Visit (HOSPITAL_COMMUNITY): Payer: Self-pay | Admitting: Nurse Practitioner

## 2016-10-26 DIAGNOSIS — Z885 Allergy status to narcotic agent status: Secondary | ICD-10-CM | POA: Diagnosis not present

## 2016-10-26 DIAGNOSIS — Z3687 Encounter for antenatal screening for uncertain dates: Secondary | ICD-10-CM

## 2016-10-26 DIAGNOSIS — G40909 Epilepsy, unspecified, not intractable, without status epilepticus: Secondary | ICD-10-CM

## 2016-10-26 DIAGNOSIS — O99282 Endocrine, nutritional and metabolic diseases complicating pregnancy, second trimester: Secondary | ICD-10-CM | POA: Diagnosis not present

## 2016-10-26 DIAGNOSIS — O99352 Diseases of the nervous system complicating pregnancy, second trimester: Secondary | ICD-10-CM | POA: Insufficient documentation

## 2016-10-26 DIAGNOSIS — O99332 Smoking (tobacco) complicating pregnancy, second trimester: Secondary | ICD-10-CM | POA: Diagnosis not present

## 2016-10-26 DIAGNOSIS — Z3689 Encounter for other specified antenatal screening: Secondary | ICD-10-CM

## 2016-10-26 DIAGNOSIS — O2692 Pregnancy related conditions, unspecified, second trimester: Secondary | ICD-10-CM

## 2016-10-26 DIAGNOSIS — Z79899 Other long term (current) drug therapy: Secondary | ICD-10-CM | POA: Insufficient documentation

## 2016-10-26 DIAGNOSIS — O09522 Supervision of elderly multigravida, second trimester: Secondary | ICD-10-CM | POA: Insufficient documentation

## 2016-10-26 DIAGNOSIS — O09521 Supervision of elderly multigravida, first trimester: Secondary | ICD-10-CM

## 2016-10-26 DIAGNOSIS — Z3A16 16 weeks gestation of pregnancy: Secondary | ICD-10-CM

## 2016-10-26 DIAGNOSIS — O09529 Supervision of elderly multigravida, unspecified trimester: Secondary | ICD-10-CM

## 2016-10-26 DIAGNOSIS — Z3682 Encounter for antenatal screening for nuchal translucency: Secondary | ICD-10-CM

## 2016-10-26 NOTE — Consult Note (Signed)
Maternal Fetal Medicine Consultation  Requesting Provider(s):  Primary OB: Reason for consultation:  HPI: Hannah Cook is a 36 yo [id number] who is  currently at 16w 0d who was seen for consultation due to a history of seizure disorder s/p craniotomy.  Review of operative reports shows that the patient was dropped off by a friend in the ED in 2002 after "falling out of bed".  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  The patient underwent a left parietal craniotomy for a hematoma resection and a left frontal ventricular catheter placement via burr hole.  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 The patient reports a history of seizure disorder since this event - she is currently followed by [facility] and maintained on Lamictal.  She is currently on Lamictal 150 mg BID - this was the dose that worked well for her during her last pregnancy.  The patient also reports a history of hypothyroidism. She reports that she has not required synthroid and that her levels have been normal - followed by an endocrinologist at [facility]. She does report a history of Anxiety disorder that was previously treated with Xanax.  She is without complaints today.   OB History: OB History    Gravida Para Term Preterm AB Living   6 3 3   2 3    SAB TAB Ectopic Multiple Live Births   1 1     1       PMH:  Past Medical History:  Diagnosis Date  . Aneurysm (HCC)   . Aneurysm (HCC) 2002  . Asthma   . Panic attack   . 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 Thyroid disease     PSH:  Past Surgical History:  Procedure Laterality Date  . ANEURYSM COILING    . BRAIN SURGERY    . TONSILLECTOMY     Meds:  Current Outpatient Prescriptions on File Prior to Encounter  Medication Sig Dispense Refill  . dicyclomine (BENTYL) 20 MG tablet Take 1 tablet (20 mg total) by mouth 2 (two) times daily. (Patient not taking: Reported on  08/14/2016) 20 tablet 0  . ibuprofen (ADVIL,MOTRIN) 800 MG tablet Take 1 tablet (800 mg total) by mouth every 8 (eight) hours as needed for moderate pain. (Patient not taking: Reported on 08/14/2016) 21 tablet 0  . lamoTRIgine (LAMICTAL) 100 MG tablet Take 100 mg by mouth 2 (two) times daily.    Marland Kitchen loperamide (IMODIUM) 2 MG capsule Take 1 capsule (2 mg total) by mouth 4 (four) times daily as needed for diarrhea or loose stools. (Patient not taking: Reported on 08/14/2016) 20 capsule 0  . naproxen (NAPROSYN) 375 MG tablet Take 1 tablet (375 mg total) by mouth 2 (two) times daily with a meal. (Patient not taking: Reported on 08/14/2016) 14 tablet 0  . ondansetron (ZOFRAN) 4 MG tablet Take 1 tablet (4 mg total) by mouth every 6 (six) hours. (Patient not taking: Reported on 08/14/2016) 12 tablet 0  . Prenatal Vit-Fe Fumarate-FA (PRENATAL VITAMIN PO) Take by mouth.    . [DISCONTINUED] topiramate (TOPAMAX) 25 MG tablet Take 25 mg by mouth 2 (two) times daily.     No current facility-administered medications on file prior to encounter.    Allergies:  Allergies  Allergen Reactions  . Hydroxacen [Hydroxyzine] Itching and Nausea And Vomiting  . Vicodin [Hydrocodone-Acetaminophen] Itching, Nausea And Vomiting and Other (See Comments)    headaches  FH:  Family History  Problem Relation Age of Onset  . Thyroid disease Mother   . Aortic aneurysm Mother        heart  . Diabetes Mother   . Hypertension Maternal Aunt   . Hypertension Maternal Uncle   . Hypertension Maternal Grandmother   . Diabetes Maternal Grandmother    Soc:  Social History   Social History  . Marital status: Single    Spouse name: N/A  . Number of children: N/A  . Years of education: N/A   Occupational History  . Not on file.   Social History Main Topics  . Smoking status: Current Every Day Smoker    Packs/day: 1.00    Years: 18.00    Types: Cigarettes  . Smokeless tobacco: Never Used  . Alcohol use No  . Drug use: No   . Sexual activity: Yes    Birth control/ protection: Condom     Comment: With men only   Other Topics Concern  . Not on file   Social History Narrative  . No narrative on file   Review of Systems: no vaginal bleeding or cramping/contractions, no LOF, no nausea/vomiting. All other systems reviewed and are negative.  PE:  198 lbs, 108/61, 91  GEN: well-appearing female ABD: gravid, NT  Ultrasound: Single IUP at 16w 0d by ultrasound today Limited views of the fetal heart, posterior fossa obtained No markers associated with aneuploidy noted Posterior placenta without previa Normal amniotic fluid volume   A/P:  1) Single IUP at 16 0/7 weeks         2) Hx of Left parietal craniotomy, evacuation of hematoma, Aneurysm coiling         3) Seizure disorder secondary to above         4) Hx of hypothyroidism -not currently on synthroid  5) Advanced maternal age - see separate note from [employer] Counselor.  The patient elected to undergo NIPT.    Recommendations: 1) [name] has had several spontaneous vaginal deliveries since her craniotomy / aneurysm coiling - feel that she can safely have another vaginal delivery (C-section for Obstetric indications only) 2) Recommend frequent Lamotrigine levels at least each trimester or more frequently - Lamotrigine clearance increases by a factor of 2-3 during pregnancy and peaks during the second trimester.  The patient will need frequent follow up with her neurologist and adjustment of medications as appropriate.  She is currently on the same dose that worked well for her in her previous pregnancy. 3) Recommend follow up ultrasound in 4 weeks - Lamictal may be associated with an increased risk of oral clefts.  Patient is scheduled for a follow up ultrasound in 4 weeks to complete anatomy 4) Check TSH and free T4 at least each trimester. 5) Please do not draw triple/quad screen, though patient should be offered MSAFP for neural tube defect screening  (NIPT pending)   Thank you for the opportunity to be a part of the care of Hannah Cook. Please contact our office if we can be of further assistance.   I spent approximately 30 minutes with this patient with over 50% of time spent in face-to-face counseling.  Alpha GulaPaul Nevada Mullett, MD Maternal Fetal Medicine

## 2016-10-26 NOTE — Progress Notes (Signed)
Genetic Counseling  High-Risk Gestation Note  Appointment Date:  10/26/2016 Referred By: Alberteen Spindlearson, Ashley C, NP Date of Birth:  10/04/1980   Pregnancy History: [id number] Estimated Date of Delivery: 04/12/17 Estimated Gestational Age: 3113w0d Attending: Alpha GulaPaul Whitecar, MD   Hannah. Hannah Cook was seen for genetic counseling because of a maternal age of 36 y.o.. She will be 36 years old at delivery.     In summary:  Discussed AMA and associated risk for fetal aneuploidy  Discussed options for screening  Quad screen- declined  NIPS- elected to pursue today  Ultrasound- performed today, too far advanced for NT, see separate report  Discussed diagnostic testing options  Amniocentesis- declined  Reviewed family history concerns  MFM consult today given patient's medical history- see separate report  Discussed carrier screening options - declined  CF  SMA  Hemoglobinopathies  She was counseled regarding maternal age and the association with risk for chromosome conditions due to nondisjunction with aging of the ova.   We reviewed chromosomes, nondisjunction, and the associated 1 in 111 risk for fetal aneuploidy related to a maternal age of 36 years old at delivery.  She was counseled that the risk for aneuploidy decreases as gestational age increases, accounting for those pregnancies which spontaneously abort.  We specifically discussed Down syndrome (trisomy 4121), trisomies 3313 and 5618, and sex chromosome aneuploidies (47,XXX and 47,XXY) including the common features and prognoses of each.   Ultrasound performed today visualized the pregnancy to be 2513w0d, thus changing the EDC. See separate ultrasound report.   We reviewed available screening options including Quad screen, noninvasive prenatal screening (NIPS)/cell free DNA (cfDNA) screening, and detailed ultrasound.  She was counseled that screening tests are used to modify a patient's a priori risk for aneuploidy, typically based on  age. This estimate provides a pregnancy specific risk assessment. We reviewed the benefits and limitations of each option. Specifically, we discussed the conditions for which each test screens, the detection rates, and false positive rates of each. She was also counseled regarding diagnostic testing via amniocentesis. We reviewed the approximate 1 in 300-500 risk for complications from amniocentesis, including spontaneous pregnancy loss. We discussed the possible results that the tests might provide including: positive, negative, unanticipated, and no result. Finally, they were counseled regarding the cost of each option and potential out of pocket expenses. After consideration of all the options, she elected to proceed with NIPS ([facility]).  Those results will be available in 8-10 days.  She declined maternal serum screening and amniocentesis at this time.   A complete ultrasound was performed today. The ultrasound report will be sent under separate cover. She understands that screening tests cannot rule out all birth defects or genetic syndromes. The patient was advised of this limitation and states she still does not want additional testing at this time.   Hannah. Hannah Cook was provided with written information regarding cystic fibrosis (CF), spinal muscular atrophy (SMA) and hemoglobinopathies including the carrier frequency, availability of carrier screening and prenatal diagnosis if indicated.  In addition, we discussed that CF and hemoglobinopathies are routinely screened for as part of the [place] newborn screening panel.  After further discussion, she declined screening for CF, SMA and hemoglobinopathies.  Both family histories were reviewed and found to be noncontributory for birth defects, intellectual disability, and known genetic conditions. Without further information regarding the provided family history, an accurate genetic risk cannot be calculated. Further genetic  counseling is warranted if more information is obtained.  Hannah. Hannah Cook denied exposure to environmental toxins or chemical agents. She reported taking lamictal during the pregnancy given her history of seizures. Hannah Cook reported that her seizures are secondary to her aneurysm, which occurred at age 1 years old. Available data regarding prenatal use of Lamictal have not indicated an association with an increased risk for birth defects. Conflicting reports exist on whether or not its use is associated with an increased risk for oral clefting. Further studies are needed to elucidate potential associations with prenatal use of Lamictal. See separate MFM consult note from today's visit for detailed discussion of patient's medical history and pregnancy management. She denied the use of alcohol, tobacco or street drugs. She denied significant viral illnesses during the course of her pregnancy. Her medical and surgical histories were otherwise noncontributory.   I counseled Hannah Cook  regarding the above risks and available options.  The approximate face-to-face time with the genetic counselor was 30 minutes.  Quinn Plowman, Hannah,  Certified Genetic Counselor 10/26/2016

## 2016-10-30 ENCOUNTER — Encounter: Payer: Self-pay | Admitting: *Deleted

## 2016-11-01 ENCOUNTER — Encounter: Payer: Self-pay | Admitting: Family Medicine

## 2016-11-01 ENCOUNTER — Ambulatory Visit (INDEPENDENT_AMBULATORY_CARE_PROVIDER_SITE_OTHER): Payer: Medicaid Other | Admitting: Family Medicine

## 2016-11-01 ENCOUNTER — Encounter: Payer: Self-pay | Admitting: *Deleted

## 2016-11-01 VITALS — BP 111/54 | HR 90 | Wt 201.4 lb

## 2016-11-01 DIAGNOSIS — Z8639 Personal history of other endocrine, nutritional and metabolic disease: Secondary | ICD-10-CM | POA: Insufficient documentation

## 2016-11-01 DIAGNOSIS — G40909 Epilepsy, unspecified, not intractable, without status epilepticus: Secondary | ICD-10-CM | POA: Diagnosis not present

## 2016-11-01 DIAGNOSIS — O09522 Supervision of elderly multigravida, second trimester: Secondary | ICD-10-CM | POA: Diagnosis not present

## 2016-11-01 DIAGNOSIS — O99352 Diseases of the nervous system complicating pregnancy, second trimester: Secondary | ICD-10-CM | POA: Diagnosis not present

## 2016-11-01 DIAGNOSIS — O0992 Supervision of high risk pregnancy, unspecified, second trimester: Secondary | ICD-10-CM

## 2016-11-01 DIAGNOSIS — O09529 Supervision of elderly multigravida, unspecified trimester: Secondary | ICD-10-CM

## 2016-11-01 DIAGNOSIS — O99350 Diseases of the nervous system complicating pregnancy, unspecified trimester: Principal | ICD-10-CM

## 2016-11-01 DIAGNOSIS — O0990 Supervision of high risk pregnancy, unspecified, unspecified trimester: Secondary | ICD-10-CM

## 2016-11-01 LAB — POCT URINALYSIS DIP (DEVICE)
Bilirubin Urine: NEGATIVE
Glucose, UA: NEGATIVE mg/dL
Hgb urine dipstick: NEGATIVE
KETONES UR: NEGATIVE mg/dL
Leukocytes, UA: NEGATIVE
NITRITE: NEGATIVE
PH: 7 (ref 5.0–8.0)
PROTEIN: NEGATIVE mg/dL
Specific Gravity, Urine: 1.02 (ref 1.005–1.030)
Urobilinogen, UA: 0.2 mg/dL (ref 0.0–1.0)

## 2016-11-01 LAB — HM PAP SMEAR: Pap: NEGATIVE

## 2016-11-01 MED ORDER — ONDANSETRON 4 MG PO TBDP: 4.0000 mg | ORAL_TABLET | Freq: Four times a day (QID) | ORAL | 4 refills | Status: DC | PRN

## 2016-11-01 MED ORDER — NICOTINE 21 MG/24HR TD PT24: 21.0000 mg | MEDICATED_PATCH | Freq: Every day | TRANSDERMAL | 0 refills | Status: DC

## 2016-11-01 NOTE — Progress Notes (Signed)
Subjective:  Hannah Cook is a [id number] [redacted]w[redacted]d being seen today for her first obstetrical visit.  She was previously seen at [facility] and was referred here. Her obstetrical history is significant for seizure disorder on lamictal, history of ruptured aneurysm, and AMA. . Patient does intend to breast feed. Pregnancy history fully reviewed.  Patient reports vomiting.  BP (!) 111/54   Pulse 90   Wt 201 lb 6.4 oz (91.4 kg)   LMP 07/21/2016 (Approximate)   BMI 32.51 kg/m   HISTORY: OB History  Gravida Para Term Preterm AB Living  6 3 3   2 3   SAB TAB Ectopic Multiple Live Births  1 1 0 0 3    # Outcome Date GA Lbr Len/2nd Weight Sex Delivery Anes PTL Lv  6 Current           5 TAB 2016          4 SAB 04/2014          3 Term 12/20/13 [redacted]w[redacted]d 20:01 / 02:00 7 lb 8.8 oz (3.425 kg) M Vag-Spont EPI  LIV  2 Term 02/17/01 [redacted]w[redacted]d  6 lb 5 oz (2.863 kg) F Vag-Spont   LIV     Birth Comments: no complications, born at [facility]  1 Term 01/19/00 [redacted]w[redacted]d  6 lb 7 oz (2.92 kg) M Vag-Spont EPI  LIV     Birth Comments: no complications, born [facility]      Past Medical History:  Diagnosis Date  . Aneurysm (HCC)   . Aneurysm (HCC) 2002  . Asthma   . Hypothyroidism   . Panic attack   . 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 Thyroid disease     Past Surgical History:  Procedure Laterality Date  . ANEURYSM COILING    . BRAIN SURGERY    . TONSILLECTOMY      Family History  Problem Relation Age of Onset  . Thyroid disease Mother   . Aortic aneurysm Mother        heart  . Diabetes Mother   . Hypertension Maternal Aunt   . Hypertension Maternal Uncle   . Hypertension Maternal Grandmother   . Diabetes Maternal Grandmother   . Hypertension Sister   . Diabetes Sister      Exam     Skin: normal coloration and turgor, no rashes    Neurologic: gait normal; reflexes normal and symmetric   Extremities: normal strength, tone, and muscle mass, no  deformities, no erythema, induration, or nodules   HEENT PERRLA and extra ocular movement intact   Mouth/Teeth mucous membranes moist, pharynx normal without lesions   Neck supple and no masses   Cardiovascular: regular rate and rhythm, no murmurs or gallops   Respiratory:  appears well, vitals normal, no respiratory distress, acyanotic, normal RR, ear and throat exam is normal, neck free of mass or lymphadenopathy, chest clear, no wheezing, crepitations, rhonchi, normal symmetric air entry   Abdomen: soft, mild diffuse tenderness.     Assessment:    Pregnancy: [id number] Patient Active Problem List   Diagnosis Date Noted  . History of hypothyroidism 11/01/2016  . Advanced maternal age in multigravida 10/26/2016  . Supervision of high risk pregnancy, antepartum 10/21/2014  . Obesity (BMI 30.0-34.9) 02/16/2014  . Seizure disorder in pregnancy, antepartum (HCC) 05/20/2013      Plan:   1. Seizure disorder in pregnancy, antepartum (HCC) On Lamictal. Has neurology in [place]. Will  be making appt to be seen.  2. Supervision of high risk pregnancy, antepartum PNL already done.  On PHQ-9, had answered yes to "I made plans to end my life in the last 2 weeks". I reviewed this with her and she had meant to respond "no". - TSH  3. Elderly multigravida in second trimester NIPS drawn previously - TSH  4. History of hypothyroidism Has history of thyroid, had iodine and reports that it improved. Will check TSH. - TSH     Problem list reviewed and updated. 100% of 30 min visit spent on counseling and coordination of care.     Levie HeritageJacob J Stinson 09/28/2016

## 2016-11-01 NOTE — Progress Notes (Signed)
Patient scored 10 on phq9 and answered yes to the question "I have made plans to end my life in the past 2 weeks". Provider aware of both.

## 2016-11-02 LAB — TSH: TSH: 0.041 u[IU]/mL — ABNORMAL LOW (ref 0.450–4.500)

## 2016-11-03 ENCOUNTER — Other Ambulatory Visit: Payer: Self-pay

## 2016-11-05 ENCOUNTER — Encounter: Payer: Self-pay | Admitting: Obstetrics & Gynecology

## 2016-11-05 ENCOUNTER — Telehealth (HOSPITAL_COMMUNITY): Payer: Self-pay | Admitting: MS"

## 2016-11-05 DIAGNOSIS — Z8679 Personal history of other diseases of the circulatory system: Secondary | ICD-10-CM | POA: Insufficient documentation

## 2016-11-05 NOTE — Telephone Encounter (Signed)
Called Hannah Cook to discuss her prenatal cell free DNA test results.  Ms. Hannah Cook had [facility] testing through [facility].  Testing was offered because of advanced maternal age.   The patient was identified by name and DOB.  We reviewed that these are within normal limits, showing a less than 1 in 10,000 risk for trisomies 21, 18 and 13, and monosomy X (Turner syndrome).  In addition, the risk for triploidy and sex chromosome trisomies (47,XXX and 47,XXY) was also low risk.  We reviewed that this testing identifies > 99% of pregnancies with trisomy 3821, trisomy 1113, sex chromosome trisomies (47,XXX and 47,XXY), and triploidy. The detection rate for trisomy 18 is 96%.  The detection rate for monosomy X is ~92%.  The false positive rate is <0.1% for all conditions. Testing was also consistent with female fetal sex.  The patient did wish to know fetal sex.  She understands that this testing does not identify all genetic conditions.  All questions were answered to her satisfaction, she was encouraged to call with additional questions or concerns.  Quinn PlowmanKaren Rhonda Vangieson, MS Certified Genetic Counselor 11/05/2016 10:31 AM

## 2016-11-23 ENCOUNTER — Encounter (HOSPITAL_COMMUNITY): Payer: Self-pay

## 2016-11-23 ENCOUNTER — Ambulatory Visit (HOSPITAL_COMMUNITY)
Admission: RE | Admit: 2016-11-23 | Discharge: 2016-11-23 | Disposition: A | Discharge status: Home / Self Care | Payer: Medicaid Other | Source: Ambulatory Visit | Attending: Nurse Practitioner | Admitting: Nurse Practitioner

## 2016-11-24 LAB — T4, FREE: Free T4: 1.3 ng/dL (ref 0.82–1.77)

## 2016-11-24 LAB — SPECIMEN STATUS REPORT

## 2016-11-24 LAB — T3, FREE: T3 FREE: 3.5 pg/mL (ref 2.0–4.4)

## 2016-11-29 ENCOUNTER — Encounter: Payer: Medicaid Other | Admitting: Obstetrics and Gynecology

## 2016-11-29 ENCOUNTER — Encounter: Payer: Self-pay | Admitting: *Deleted

## 2016-11-29 NOTE — Progress Notes (Signed)
Patient missed ob appt today. Per Dr Vergie LivingPickens no need to call, patient may reschedule.

## 2016-11-30 ENCOUNTER — Encounter: Payer: Self-pay | Admitting: Obstetrics and Gynecology

## 2016-11-30 NOTE — Progress Notes (Signed)
Patient did not keep OB appointment for 11/29/2016.  Hannah Cook, Jr MD Attending [facility](Faculty Practice)

## 2016-12-17 ENCOUNTER — Ambulatory Visit (INDEPENDENT_AMBULATORY_CARE_PROVIDER_SITE_OTHER): Payer: Medicaid Other | Admitting: Family Medicine

## 2016-12-17 VITALS — BP 111/66 | HR 89 | Wt 198.6 lb

## 2016-12-17 DIAGNOSIS — O99352 Diseases of the nervous system complicating pregnancy, second trimester: Secondary | ICD-10-CM

## 2016-12-17 DIAGNOSIS — O09522 Supervision of elderly multigravida, second trimester: Secondary | ICD-10-CM

## 2016-12-17 DIAGNOSIS — G40909 Epilepsy, unspecified, not intractable, without status epilepticus: Secondary | ICD-10-CM

## 2016-12-17 DIAGNOSIS — O0990 Supervision of high risk pregnancy, unspecified, unspecified trimester: Secondary | ICD-10-CM

## 2016-12-17 DIAGNOSIS — Z8639 Personal history of other endocrine, nutritional and metabolic disease: Secondary | ICD-10-CM

## 2016-12-17 DIAGNOSIS — O0992 Supervision of high risk pregnancy, unspecified, second trimester: Secondary | ICD-10-CM | POA: Diagnosis not present

## 2016-12-17 MED ORDER — DOXYLAMINE SUCCINATE (SLEEP) 25 MG PO TABS: 25.0000 mg | ORAL_TABLET | Freq: Four times a day (QID) | ORAL | 2 refills | Status: DC | PRN

## 2016-12-17 MED ORDER — PYRIDOXINE HCL 25 MG PO TABS: 25.0000 mg | ORAL_TABLET | Freq: Four times a day (QID) | ORAL | 3 refills | Status: DC | PRN

## 2016-12-17 NOTE — Progress Notes (Signed)
Pt reports feeling nauseated and is vomiting.

## 2016-12-17 NOTE — Progress Notes (Signed)
R

## 2016-12-17 NOTE — Progress Notes (Signed)
   PRENATAL VISIT NOTE  Subjective:  Hannah Cook is a 36 y.o. [phone] at 2636w3d being seen today for ongoing prenatal care.  She is currently monitored for the following issues for this high-risk pregnancy and has Seizure disorder in pregnancy, antepartum (HCC); Obesity (BMI 30.0-34.9); Supervision of high risk pregnancy, antepartum; Advanced maternal age in multigravida; History of hypothyroidism; and History of intracranial aneurysm s/p coiling on her problem list.  Patient reports nausea and vomiting.  Contractions: Not present. Vag. Bleeding: None.  Movement: Present. Denies leaking of fluid.   The following portions of the patient's history were reviewed and updated as appropriate: allergies, current medications, past family history, past medical history, past social history, past surgical history and problem list. Problem list updated.  Objective:   Vitals:   12/17/16 1246  BP: 111/66  Pulse: 89  Weight: 198 lb 9.6 oz (90.1 kg)    Fetal Status: Fetal Heart Rate (bpm): 144   Movement: Present     General:  Alert, oriented and cooperative. Patient is in no acute distress.  Skin: Skin is warm and dry. No rash noted.   Cardiovascular: Normal heart rate noted  Respiratory: Normal respiratory effort, no problems with respiration noted  Abdomen: Soft, gravid, appropriate for gestational age. Pain/Pressure: Absent     Pelvic:  Cervical exam deferred        Extremities: Normal range of motion.  Edema: None  Mental Status: Normal mood and affect. Normal behavior. Normal judgment and thought content.   Assessment and Plan:  Pregnancy: [id number] at 4736w3d  1. Seizure disorder in pregnancy, antepartum, second trimester (HCC) Continue lamictal. - US MFM OB COMP + 14 WK; Future  2. Supervision of high risk pregnancy, antepartum FHT, FH  3. History of hypothyroidism  4. Elderly multigravida in second trimester No additional testing needed  Preterm labor symptoms and general  obstetric precautions including but not limited to vaginal bleeding, contractions, leaking of fluid and fetal movement were reviewed in detail with the patient. Please refer to After Visit Summary for other counseling recommendations.  Return in about 4 weeks (around 01/14/2017) for HR OB f/u, 2 hr GTT.   Levie HeritageJacob J Desarie Feild, DO

## 2016-12-27 ENCOUNTER — Ambulatory Visit (HOSPITAL_COMMUNITY)
Admission: RE | Admit: 2016-12-27 | Discharge: 2016-12-27 | Disposition: A | Discharge status: Home / Self Care | Payer: Medicaid Other | Source: Ambulatory Visit | Attending: Family Medicine | Admitting: Family Medicine

## 2016-12-27 DIAGNOSIS — G40909 Epilepsy, unspecified, not intractable, without status epilepticus: Secondary | ICD-10-CM

## 2016-12-27 DIAGNOSIS — O99352 Diseases of the nervous system complicating pregnancy, second trimester: Principal | ICD-10-CM

## 2017-01-04 ENCOUNTER — Ambulatory Visit (HOSPITAL_COMMUNITY)
Admission: RE | Admit: 2017-01-04 | Discharge: 2017-01-04 | Disposition: A | Discharge status: Home / Self Care | Payer: Medicaid Other | Source: Ambulatory Visit | Attending: Family Medicine | Admitting: Family Medicine

## 2017-01-04 ENCOUNTER — Encounter (HOSPITAL_COMMUNITY): Payer: Self-pay

## 2017-01-04 DIAGNOSIS — Z3A26 26 weeks gestation of pregnancy: Secondary | ICD-10-CM | POA: Diagnosis not present

## 2017-01-04 DIAGNOSIS — O99352 Diseases of the nervous system complicating pregnancy, second trimester: Secondary | ICD-10-CM | POA: Insufficient documentation

## 2017-01-04 DIAGNOSIS — G40909 Epilepsy, unspecified, not intractable, without status epilepticus: Secondary | ICD-10-CM | POA: Diagnosis not present

## 2017-01-04 DIAGNOSIS — O09522 Supervision of elderly multigravida, second trimester: Secondary | ICD-10-CM | POA: Diagnosis not present

## 2017-01-04 DIAGNOSIS — O99332 Smoking (tobacco) complicating pregnancy, second trimester: Secondary | ICD-10-CM | POA: Diagnosis not present

## 2017-01-04 DIAGNOSIS — Z362 Encounter for other antenatal screening follow-up: Secondary | ICD-10-CM | POA: Diagnosis not present

## 2017-01-07 ENCOUNTER — Other Ambulatory Visit (HOSPITAL_COMMUNITY): Payer: Self-pay | Admitting: *Deleted

## 2017-01-07 DIAGNOSIS — O09522 Supervision of elderly multigravida, second trimester: Secondary | ICD-10-CM

## 2017-01-08 ENCOUNTER — Encounter (HOSPITAL_COMMUNITY): Payer: Self-pay | Admitting: *Deleted

## 2017-01-08 ENCOUNTER — Inpatient Hospital Stay (HOSPITAL_COMMUNITY)
Admission: AD | Admit: 2017-01-08 | Discharge: 2017-01-08 | Disposition: A | Discharge status: Home / Self Care | Payer: Medicaid Other | Source: Ambulatory Visit | Attending: Family Medicine | Admitting: Family Medicine

## 2017-01-08 DIAGNOSIS — O26892 Other specified pregnancy related conditions, second trimester: Secondary | ICD-10-CM | POA: Insufficient documentation

## 2017-01-08 DIAGNOSIS — Z3A26 26 weeks gestation of pregnancy: Secondary | ICD-10-CM | POA: Insufficient documentation

## 2017-01-08 DIAGNOSIS — O99332 Smoking (tobacco) complicating pregnancy, second trimester: Secondary | ICD-10-CM | POA: Insufficient documentation

## 2017-01-08 DIAGNOSIS — F17210 Nicotine dependence, cigarettes, uncomplicated: Secondary | ICD-10-CM | POA: Insufficient documentation

## 2017-01-08 DIAGNOSIS — N898 Other specified noninflammatory disorders of vagina: Secondary | ICD-10-CM

## 2017-01-08 LAB — WET PREP, GENITAL
Clue Cells Wet Prep HPF POC: NONE SEEN
Sperm: NONE SEEN
TRICH WET PREP: NONE SEEN
WBC, Wet Prep HPF POC: NONE SEEN
Yeast Wet Prep HPF POC: NONE SEEN

## 2017-01-08 LAB — URINALYSIS, ROUTINE W REFLEX MICROSCOPIC
BILIRUBIN URINE: NEGATIVE
Glucose, UA: NEGATIVE mg/dL
Hgb urine dipstick: NEGATIVE
KETONES UR: NEGATIVE mg/dL
Leukocytes, UA: NEGATIVE
NITRITE: NEGATIVE
PH: 7 (ref 5.0–8.0)
PROTEIN: NEGATIVE mg/dL
Specific Gravity, Urine: 1.012 (ref 1.005–1.030)

## 2017-01-08 NOTE — Discharge Instructions (Signed)

## 2017-01-08 NOTE — MAU Provider Note (Signed)
History     CSN: [id number]  Arrival date and time: 01/08/17 0945   First Provider Initiated Contact with Patient 01/08/17 1044      Chief Complaint  Patient presents with  . Vaginal Discharge   HPI   Ms.Hannah Cook is a 36 y.o. female [phone] @ 9387w4d here in MAU with complaints of vaginal discharge. She first noticed the discharge 1 week ago. The discharge is described as [place] in color with an odor. "it stinks". States she has never had this discharge before. States she has not tried anything over the counter. Not sexually active at this time. Last intercourse was in January. Denies vaginal bleeding. + fetal movement.   OB History    Gravida Para Term Preterm AB Living   6 3 3   2 3    SAB TAB Ectopic Multiple Live Births   1 1 0 0 3      Past Medical History:  Diagnosis Date  . Aneurysm (HCC) 2002  . Asthma   . Hypothyroidism   . Panic attack   . 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 Thyroid disease     Past Surgical History:  Procedure Laterality Date  . ANEURYSM COILING    . BRAIN SURGERY    . TONSILLECTOMY      Family History  Problem Relation Age of Onset  . Thyroid disease Mother   . Aortic aneurysm Mother        heart  . Diabetes Mother   . Hypertension Maternal Aunt   . Hypertension Maternal Uncle   . Hypertension Maternal Grandmother   . Diabetes Maternal Grandmother   . Hypertension Sister   . Diabetes Sister   . Asthma Daughter     Social History  Substance Use Topics  . Smoking status: Current Every Day Smoker    Packs/day: 0.50    Years: 18.00    Types: Cigarettes  . Smokeless tobacco: Never Used  . Alcohol use No    Allergies:  Allergies  Allergen Reactions  . Hydroxacen [Hydroxyzine] Itching and Nausea And Vomiting  . Vicodin [Hydrocodone-Acetaminophen] Itching, Nausea And Vomiting and Other (See Comments)    headaches    Prescriptions Prior to Admission  Medication Sig Dispense Refill Last  Dose  . doxylamine, Sleep, (UNISOM) 25 MG tablet Take 1 tablet (25 mg total) by mouth 4 (four) times daily as needed (nausea and vomiting). (Patient not taking: Reported on 01/04/2017) 30 tablet 2 Not Taking  . nicotine (NICODERM CQ) 21 mg/24hr patch Place 1 patch (21 mg total) onto the skin daily. (Patient not taking: Reported on 12/17/2016) 28 patch 0 Not Taking  . ondansetron (ZOFRAN ODT) 4 MG disintegrating tablet Take 1 tablet (4 mg total) by mouth every 6 (six) hours as needed for nausea. (Patient not taking: Reported on 12/17/2016) 20 tablet 4 Not Taking  . pyridOXINE (VITAMIN B-6) 25 MG tablet Take 1 tablet (25 mg total) by mouth 4 (four) times daily as needed (nausea and vomiting). (Patient not taking: Reported on 01/04/2017) 30 tablet 3 Not Taking   Results for orders placed or performed during the hospital encounter of 01/08/17 (from the past 48 hour(s))  Urinalysis, Routine w reflex microscopic     Status: None   Collection Time: 01/08/17  9:55 AM  Result Value Ref Range   Color, Urine YELLOW YELLOW   APPearance CLEAR CLEAR   Specific Gravity, Urine 1.012 1.005 - 1.030   pH  7.0 5.0 - 8.0   Glucose, UA NEGATIVE NEGATIVE mg/dL   Hgb urine dipstick NEGATIVE NEGATIVE   Bilirubin Urine NEGATIVE NEGATIVE   Ketones, ur NEGATIVE NEGATIVE mg/dL   Protein, ur NEGATIVE NEGATIVE mg/dL   Nitrite NEGATIVE NEGATIVE   Leukocytes, UA NEGATIVE NEGATIVE  Wet prep, genital     Status: None   Collection Time: 01/08/17 11:20 AM  Result Value Ref Range   Yeast Wet Prep HPF POC NONE SEEN NONE SEEN   Trich, Wet Prep NONE SEEN NONE SEEN   Clue Cells Wet Prep HPF POC NONE SEEN NONE SEEN   WBC, Wet Prep HPF POC NONE SEEN NONE SEEN    Comment: MODERATE BACTERIA SEEN   Sperm NONE SEEN    Review of Systems  Gastrointestinal: Negative for abdominal pain.  Genitourinary: Positive for vaginal discharge. Negative for vaginal bleeding.   Physical Exam   Blood pressure 103/63, pulse 88, temperature 98.6 F (37  C), temperature source Oral, resp. rate 18, height 5' 6.14" (1.68 m), weight 205 lb (93 kg), last menstrual period 07/21/2016, SpO2 99 %.  Physical Exam  Constitutional: She is oriented to person, place, and time. She appears well-developed and well-nourished. No distress.  HENT:  Head: Normocephalic.  Eyes: Pupils are equal, round, and reactive to light.  Genitourinary:  Genitourinary Comments: Wet prep and GC collected by RN without speculum.   Neurological: She is alert and oriented to person, place, and time.  Skin: Skin is warm. She is not diaphoretic.  Psychiatric: Her behavior is normal.   Fetal Tracing: Baseline: 140 bpm Variability: moderate  Accelerations: 10x10 Decelerations: quick variables  Toco: none   MAU Course  Procedures  None  MDM  Wet prep & GC   Assessment and Plan   A:  1. Vaginal discharge in pregnancy in second trimester     P:  Discharge home in stable condition Wet prep normal today, Gc pending Recommended probiotics and increase in PO fluid intake Return to MAU as needed, for emergencies.    Duane Lope, NP 01/08/2017 2:35 PM

## 2017-01-08 NOTE — MAU Note (Signed)
Been having thick vag d/c for about a wk

## 2017-01-09 LAB — GC/CHLAMYDIA PROBE AMP (~~LOC~~) NOT AT ARMC
CHLAMYDIA, DNA PROBE: NEGATIVE
Neisseria Gonorrhea: NEGATIVE

## 2017-01-17 ENCOUNTER — Encounter: Payer: Medicaid Other | Admitting: Family Medicine

## 2017-01-17 ENCOUNTER — Telehealth: Payer: Self-pay | Admitting: General Practice

## 2017-01-17 ENCOUNTER — Encounter: Payer: Medicaid Other | Admitting: Obstetrics & Gynecology

## 2017-01-17 NOTE — Telephone Encounter (Signed)
Patient was a No Show today.  Called and left message on VM in regards to follow up appointment on 01/30/17 at 9:00am with Dr. Jolayne Pantheronstant.  Asked patient to give our office a call if she is unable to keep this appointment.

## 2017-01-30 ENCOUNTER — Ambulatory Visit (INDEPENDENT_AMBULATORY_CARE_PROVIDER_SITE_OTHER): Payer: Medicaid Other | Admitting: Obstetrics and Gynecology

## 2017-01-30 VITALS — BP 108/65 | HR 91 | Wt 202.2 lb

## 2017-01-30 DIAGNOSIS — O99353 Diseases of the nervous system complicating pregnancy, third trimester: Secondary | ICD-10-CM

## 2017-01-30 DIAGNOSIS — O99350 Diseases of the nervous system complicating pregnancy, unspecified trimester: Secondary | ICD-10-CM

## 2017-01-30 DIAGNOSIS — O99333 Smoking (tobacco) complicating pregnancy, third trimester: Secondary | ICD-10-CM

## 2017-01-30 DIAGNOSIS — G40909 Epilepsy, unspecified, not intractable, without status epilepticus: Secondary | ICD-10-CM | POA: Diagnosis not present

## 2017-01-30 DIAGNOSIS — Z23 Encounter for immunization: Secondary | ICD-10-CM | POA: Diagnosis not present

## 2017-01-30 DIAGNOSIS — O09523 Supervision of elderly multigravida, third trimester: Secondary | ICD-10-CM

## 2017-01-30 DIAGNOSIS — O0993 Supervision of high risk pregnancy, unspecified, third trimester: Secondary | ICD-10-CM

## 2017-01-30 DIAGNOSIS — O99330 Smoking (tobacco) complicating pregnancy, unspecified trimester: Secondary | ICD-10-CM | POA: Insufficient documentation

## 2017-01-30 DIAGNOSIS — O0990 Supervision of high risk pregnancy, unspecified, unspecified trimester: Secondary | ICD-10-CM

## 2017-01-30 MED ORDER — NICOTINE 21 MG/24HR TD PT24: 21.0000 mg | MEDICATED_PATCH | Freq: Every day | TRANSDERMAL | 0 refills | Status: DC

## 2017-01-30 NOTE — Addendum Note (Signed)
Addended by: Catalina AntiguaONSTANT, Raydan Schlabach on: 01/30/2017 10:55 AM   Modules accepted: Orders

## 2017-01-30 NOTE — Progress Notes (Addendum)
   PRENATAL VISIT NOTE  Subjective:  Hannah SchullerDevonda A Grigorian is a 36 y.o. [phone] at 4245w5d being seen today for ongoing prenatal care.  She is currently monitored for the following issues for this high-risk pregnancy and has Seizure disorder in pregnancy, antepartum (HCC); Obesity (BMI 30.0-34.9); Supervision of high risk pregnancy, antepartum; Advanced maternal age in multigravida; History of hypothyroidism; and History of intracranial aneurysm s/p coiling on her problem list.  Patient reports no complaints.  Contractions: Not present.  .  Movement: Present. Denies leaking of fluid.   The following portions of the patient's history were reviewed and updated as appropriate: allergies, current medications, past family history, past medical history, past social history, past surgical history and problem list. Problem list updated.  Objective:   Vitals:   01/30/17 0906  BP: 108/65  Pulse: 91  Weight: 202 lb 3.2 oz (91.7 kg)    Fetal Status: Fetal Heart Rate (bpm): 135 Fundal Height: 31 cm Movement: Present     General:  Alert, oriented and cooperative. Patient is in no acute distress.  Skin: Skin is warm and dry. No rash noted.   Cardiovascular: Normal heart rate noted  Respiratory: Normal respiratory effort, no problems with respiration noted  Abdomen: Soft, gravid, appropriate for gestational age.  Pain/Pressure: Absent     Pelvic: Cervical exam deferred        Extremities: Normal range of motion.     Mental Status:  Normal mood and affect. Normal behavior. Normal judgment and thought content.   Assessment and Plan:  Pregnancy: [id number] at 6545w5d  1. Supervision of high risk pregnancy, antepartum Patient is doing well without complaints Third trimester labs, tdap and glucola today Patient requested assistance with protein cessation. Current 2 pack a day smoker. Rx Nicotine patch provided - Glucose Tolerance, 2 Hours w/1 Hour - CBC - HIV antibody - RPR  2. Elderly multigravida in third  trimester Low risk NIPS Follow up growth ultrasound 8/17  3. Seizure disorder in pregnancy, antepartum (HCC) Continue lamictal Seizure free thus far in the pregnancy Has not seen Neurology and does not have an upcoming appointment  Preterm labor symptoms and general obstetric precautions including but not limited to vaginal bleeding, contractions, leaking of fluid and fetal movement were reviewed in detail with the patient. Please refer to After Visit Summary for other counseling recommendations.  Return in about 2 weeks (around 02/13/2017) for ROB.   Catalina AntiguaPeggy Hyatt Capobianco, MD

## 2017-01-31 LAB — CBC
Hematocrit: 33.6 % — ABNORMAL LOW (ref 34.0–46.6)
Hemoglobin: 11.3 g/dL (ref 11.1–15.9)
MCH: 32.3 pg (ref 26.6–33.0)
MCHC: 33.6 g/dL (ref 31.5–35.7)
MCV: 96 fL (ref 79–97)
PLATELETS: 249 10*3/uL (ref 150–379)
RBC: 3.5 x10E6/uL — ABNORMAL LOW (ref 3.77–5.28)
RDW: 15.3 % (ref 12.3–15.4)
WBC: 5.7 10*3/uL (ref 3.4–10.8)

## 2017-01-31 LAB — RPR: RPR Ser Ql: NONREACTIVE

## 2017-01-31 LAB — HIV ANTIBODY (ROUTINE TESTING W REFLEX): HIV Screen 4th Generation wRfx: NONREACTIVE

## 2017-01-31 LAB — GLUCOSE TOLERANCE, 2 HOURS W/ 1HR
GLUCOSE, FASTING: 74 mg/dL (ref 65–91)
Glucose, 1 hour: 169 mg/dL (ref 65–179)
Glucose, 2 hour: 118 mg/dL (ref 65–152)

## 2017-02-01 ENCOUNTER — Ambulatory Visit (HOSPITAL_COMMUNITY)
Admission: RE | Admit: 2017-02-01 | Discharge: 2017-02-01 | Disposition: A | Discharge status: Home / Self Care | Payer: Medicaid Other | Source: Ambulatory Visit | Attending: Family Medicine | Admitting: Family Medicine

## 2017-02-01 ENCOUNTER — Encounter (HOSPITAL_COMMUNITY): Payer: Self-pay

## 2017-02-01 ENCOUNTER — Other Ambulatory Visit (HOSPITAL_COMMUNITY): Payer: Self-pay | Admitting: Maternal and Fetal Medicine

## 2017-02-01 DIAGNOSIS — O09523 Supervision of elderly multigravida, third trimester: Secondary | ICD-10-CM

## 2017-02-01 DIAGNOSIS — O09522 Supervision of elderly multigravida, second trimester: Secondary | ICD-10-CM

## 2017-02-01 DIAGNOSIS — Z3A30 30 weeks gestation of pregnancy: Secondary | ICD-10-CM | POA: Diagnosis not present

## 2017-02-01 DIAGNOSIS — O99350 Diseases of the nervous system complicating pregnancy, unspecified trimester: Secondary | ICD-10-CM | POA: Insufficient documentation

## 2017-02-04 ENCOUNTER — Other Ambulatory Visit (HOSPITAL_COMMUNITY): Payer: Self-pay | Admitting: *Deleted

## 2017-02-04 DIAGNOSIS — Z3689 Encounter for other specified antenatal screening: Secondary | ICD-10-CM

## 2017-02-13 ENCOUNTER — Ambulatory Visit (INDEPENDENT_AMBULATORY_CARE_PROVIDER_SITE_OTHER): Payer: Medicaid Other | Admitting: Advanced Practice Midwife

## 2017-02-13 VITALS — BP 115/64 | HR 72 | Wt 201.0 lb

## 2017-02-13 DIAGNOSIS — O0993 Supervision of high risk pregnancy, unspecified, third trimester: Secondary | ICD-10-CM

## 2017-02-13 DIAGNOSIS — O99283 Endocrine, nutritional and metabolic diseases complicating pregnancy, third trimester: Secondary | ICD-10-CM

## 2017-02-13 DIAGNOSIS — O0990 Supervision of high risk pregnancy, unspecified, unspecified trimester: Secondary | ICD-10-CM

## 2017-02-13 DIAGNOSIS — E039 Hypothyroidism, unspecified: Secondary | ICD-10-CM

## 2017-02-13 NOTE — Progress Notes (Signed)
   PRENATAL VISIT NOTE  Subjective:  Hannah Cook is a 36 y.o. [phone] at [redacted]w[redacted]d being seen today for ongoing prenatal care.  She is currently monitored for the following issues for this high-risk pregnancy and has Seizure disorder in pregnancy, antepartum (HCC); Obesity (BMI 30.0-34.9); Supervision of high risk pregnancy, antepartum; Advanced maternal age in multigravida; History of hypothyroidism; History of intracranial aneurysm s/p coiling; and Tobacco smoking complicating pregnancy on her problem list.  Patient reports bleeding and vaginal odor and slight pink discharge several days ago, none now. Contractions: Not present.  .  Movement: Present. Denies leaking of fluid.   The following portions of the patient's history were reviewed and updated as appropriate: allergies, current medications, past family history, past medical history, past social history, past surgical history and problem list. Problem list updated.  Objective:   Vitals:   02/13/17 1119  BP: 115/64  Pulse: 72  Weight: 201 lb (91.2 kg)    Fetal Status: Fetal Heart Rate (bpm): 140 Fundal Height: 34 cm Movement: Present     General:  Alert, oriented and cooperative. Patient is in no acute distress.  Skin: Skin is warm and dry. No rash noted.   Cardiovascular: Normal heart rate noted  Respiratory: Normal respiratory effort, no problems with respiration noted  Abdomen: Soft, gravid, appropriate for gestational age.  Pain/Pressure: Absent     Pelvic: Pelvic exma exam offered, refused        Extremities: Normal range of motion.     Mental Status:  Normal mood and affect. Normal behavior. Normal judgment and thought content.   Assessment and Plan:  Pregnancy: [id number] at [redacted]w[redacted]d  1. Supervision of high risk pregnancy, antepartum   2. Hypothyroid in pregnancy, antepartum, third trimester  - TSH  3. Spotting-uncertain etiology since pt refused exam. Low suspicion for abruption or PTL. No previa per [place]. Rh pos  -  Bleeding precautions - Pelvic rest x 1 week  4. Seizure disorder - Encouraged to F/U w/ neuro   Preterm labor symptoms and general obstetric precautions including but not limited to vaginal bleeding, contractions, leaking of fluid and fetal movement were reviewed in detail with the patient. Please refer to After Visit Summary for other counseling recommendations.  Return in about 2 weeks (around 02/27/2017) for ROB.   Dorathy Kinsman, CNM

## 2017-02-13 NOTE — Patient Instructions (Signed)
Vaginal Bleeding During Pregnancy, Third Trimester °A small amount of bleeding (spotting) from the vagina is relatively common in pregnancy. Various things can cause bleeding or spotting in pregnancy. Sometimes the bleeding is normal and is not a problem. However, bleeding during the third trimester can also be a sign of something serious for the mother and the baby. Be sure to tell your health care provider about any vaginal bleeding right away. °Some possible causes of vaginal bleeding during the third trimester include: °· The placenta may be partially or completely covering the opening to the cervix (placenta previa). °· The placenta may have separated from the uterus (abruption of the placenta). °· There may be an infection or growth on the cervix. °· You may be starting labor, called discharging of the mucus plug. °· The placenta may grow into the muscle layer of the uterus (placenta accreta). ° °Follow these instructions at home: °Watch your condition for any changes. The following actions may help to lessen any discomfort you are feeling: °· Follow your health care provider's instructions for limiting your activity. If your health care provider orders bed rest, you may need to stay in bed and only get up to use the bathroom. However, your health care provider may allow you to continue light activity. °· If needed, make plans for someone to help with your regular activities and responsibilities while you are on bed rest. °· Keep track of the number of pads you use each day, how often you change pads, and how soaked (saturated) they are. Write this down. °· Do not use tampons. Do not douche. °· Do not have sexual intercourse or orgasms until approved by your health care provider. °· Follow your health care provider's advice about lifting, driving, and physical activities. °· If you pass any tissue from your vagina, save the tissue so you can show it to your health care provider. °· Only take over-the-counter  or prescription medicines as directed by your health care provider. °· Do not take aspirin because it can make you bleed. °· Keep all follow-up appointments as directed by your health care provider. ° °Contact a health care provider if: °· You have any vaginal bleeding during any part of your pregnancy. °· You have cramps or labor pains. °· You have a fever, not controlled by medicine. °Get help right away if: °· You have severe cramps or pain in your back or belly (abdomen). °· You have chills. °· You have a gush of fluid from the vagina. °· You pass large clots or tissue from your vagina. °· Your bleeding increases. °· You feel light-headed or weak. °· You pass out. °· You feel less movement or no movement of the baby. °This information is not intended to replace advice given to you by your health care provider. Make sure you discuss any questions you have with your health care provider. °Document Released: 08/25/2002 Document Revised: 11/10/2015 Document Reviewed: 02/09/2013 °Elsevier Interactive Patient Education © 2018 [employer] ° °

## 2017-02-14 LAB — TSH: TSH: 0.381 u[IU]/mL — ABNORMAL LOW (ref 0.450–4.500)

## 2017-02-19 ENCOUNTER — Encounter: Payer: Self-pay | Admitting: Advanced Practice Midwife

## 2017-02-28 ENCOUNTER — Encounter: Payer: Medicaid Other | Admitting: Obstetrics & Gynecology

## 2017-03-15 ENCOUNTER — Encounter (HOSPITAL_COMMUNITY): Payer: Self-pay

## 2017-03-15 ENCOUNTER — Ambulatory Visit (HOSPITAL_COMMUNITY)
Admission: RE | Admit: 2017-03-15 | Discharge: 2017-03-15 | Disposition: A | Discharge status: Home / Self Care | Payer: Medicaid Other | Source: Ambulatory Visit | Attending: Advanced Practice Midwife | Admitting: Advanced Practice Midwife

## 2017-03-15 DIAGNOSIS — O99333 Smoking (tobacco) complicating pregnancy, third trimester: Secondary | ICD-10-CM

## 2017-03-15 DIAGNOSIS — Z3689 Encounter for other specified antenatal screening: Secondary | ICD-10-CM | POA: Diagnosis not present

## 2017-03-15 DIAGNOSIS — O99353 Diseases of the nervous system complicating pregnancy, third trimester: Secondary | ICD-10-CM | POA: Diagnosis not present

## 2017-03-15 DIAGNOSIS — O09523 Supervision of elderly multigravida, third trimester: Secondary | ICD-10-CM

## 2017-03-15 DIAGNOSIS — Z3A36 36 weeks gestation of pregnancy: Secondary | ICD-10-CM | POA: Insufficient documentation

## 2017-03-15 DIAGNOSIS — G40909 Epilepsy, unspecified, not intractable, without status epilepticus: Secondary | ICD-10-CM | POA: Insufficient documentation

## 2017-03-26 ENCOUNTER — Inpatient Hospital Stay (HOSPITAL_COMMUNITY)
Admission: AD | Admit: 2017-03-26 | Discharge: 2017-03-26 | Disposition: A | Discharge status: Home / Self Care | Payer: Medicaid Other | Source: Ambulatory Visit | Attending: Family Medicine | Admitting: Family Medicine

## 2017-03-26 ENCOUNTER — Encounter (HOSPITAL_COMMUNITY): Payer: Self-pay

## 2017-03-26 DIAGNOSIS — B3731 Acute candidiasis of vulva and vagina: Secondary | ICD-10-CM

## 2017-03-26 DIAGNOSIS — O98813 Other maternal infectious and parasitic diseases complicating pregnancy, third trimester: Secondary | ICD-10-CM | POA: Insufficient documentation

## 2017-03-26 DIAGNOSIS — O99333 Smoking (tobacco) complicating pregnancy, third trimester: Secondary | ICD-10-CM | POA: Diagnosis not present

## 2017-03-26 DIAGNOSIS — F17210 Nicotine dependence, cigarettes, uncomplicated: Secondary | ICD-10-CM | POA: Diagnosis not present

## 2017-03-26 DIAGNOSIS — B373 Candidiasis of vulva and vagina: Secondary | ICD-10-CM

## 2017-03-26 DIAGNOSIS — L293 Anogenital pruritus, unspecified: Secondary | ICD-10-CM | POA: Diagnosis present

## 2017-03-26 DIAGNOSIS — Z885 Allergy status to narcotic agent status: Secondary | ICD-10-CM | POA: Diagnosis not present

## 2017-03-26 DIAGNOSIS — N9089 Other specified noninflammatory disorders of vulva and perineum: Secondary | ICD-10-CM

## 2017-03-26 DIAGNOSIS — Z3A37 37 weeks gestation of pregnancy: Secondary | ICD-10-CM | POA: Diagnosis not present

## 2017-03-26 LAB — URINALYSIS, ROUTINE W REFLEX MICROSCOPIC
Bilirubin Urine: NEGATIVE
Glucose, UA: NEGATIVE mg/dL
Hgb urine dipstick: NEGATIVE
Ketones, ur: 80 mg/dL — AB
Nitrite: NEGATIVE
Protein, ur: 30 mg/dL — AB
SPECIFIC GRAVITY, URINE: 1.023 (ref 1.005–1.030)
pH: 5 (ref 5.0–8.0)

## 2017-03-26 LAB — WET PREP, GENITAL
CLUE CELLS WET PREP: NONE SEEN
Sperm: NONE SEEN
Trich, Wet Prep: NONE SEEN

## 2017-03-26 MED ORDER — FLUCONAZOLE 150 MG PO TABS: 150.0000 mg | ORAL_TABLET | Freq: Once | ORAL | 0 refills | Status: AC

## 2017-03-26 MED ORDER — MICONAZOLE NITRATE 2 % VA CREA: 1.0000 | TOPICAL_CREAM | Freq: Every day | VAGINAL | 0 refills | Status: AC

## 2017-03-26 NOTE — MAU Provider Note (Signed)
Chief Complaint: Vaginal Itching   SUBJECTIVE HPI: Hannah Cook is a 36 y.o. [id number] at [redacted]w[redacted]d who presents to MAU due to concerns about vaginal itching and irritation. Patient states that starting yesterday she started having itching, irritation, and burning around the vagina. She thinks it may be related to the new cleansing soap she used ("Vagisil") for some vaginal odor she was having. She noticed yesterday as well a white clumpy discharge. Denies STDs; [facility] and has not been sexually active. When she touches the area pain is 9/10. Endorses odor and vaginal discharge. Denies vaginal bleeding, fevers, contractions.  Good fetal movement, no LOF, no contractions.   Past Medical History:  Diagnosis Date  . Aneurysm (HCC) 2002  . Asthma   . Hypothyroidism   . Panic attack   . 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 Thyroid disease    OB History  Gravida Para Term Preterm AB Living  SAB TAB Ectopic Multiple Live Births  1 1 0 0 3    # Outcome Date GA Lbr Len/2nd Weight Sex Delivery Anes PTL Lv  6 Current           5 TAB 2016          4 SAB 04/2014          3 Term 12/20/13 [redacted]w[redacted]d 20:01 / 02:00 3.425 kg (7 lb 8.8 oz) M Vag-Spont EPI  LIV  2 Term 02/17/01 100w0d  2.863 kg (6 lb 5 oz) F Vag-Spont   LIV     Birth Comments: no complications, born at [facility]  1 Term 01/19/00 [redacted]w[redacted]d  2.92 kg (6 lb 7 oz) M Vag-Spont EPI  LIV     Birth Comments: no complications, born [facility]     Past Surgical History:  Procedure Laterality Date  . ANEURYSM COILING    . BRAIN SURGERY    . TONSILLECTOMY     Social History   Social History  . Marital status: Single    Spouse name: N/A  . Number of children: N/A  . Years of education: N/A   Occupational History  . Not on file.   Social History Main Topics  . Smoking status: Current Every Day Smoker    Packs/day: 0.50    Years: 18.00    Types: Cigarettes  . Smokeless  tobacco: Never Used  . Alcohol use No  . Drug use: No  . Sexual activity: Yes    Birth control/ protection: Condom     Comment: With men only   Other Topics Concern  . Not on file   Social History Narrative  . No narrative on file   No current facility-administered medications on file prior to encounter.    Current Outpatient Prescriptions on File Prior to Encounter  Medication Sig Dispense Refill  . LamoTRIgine (LAMICTAL PO) Take by mouth.    . nicotine (NICODERM CQ - DOSED IN MG/24 HOURS) 21 mg/24hr patch Place 1 patch (21 mg total) onto the skin daily. (Patient not taking: Reported on 03/15/2017) 28 patch 0  . ondansetron (ZOFRAN ODT) 4 MG disintegrating tablet Take 1 tablet (4 mg total) by mouth every 6 (six) hours as needed for nausea. (Patient not taking: Reported on 03/15/2017) 20 tablet 4  . pyridOXINE (VITAMIN B-6) 25 MG tablet Take 1 tablet (25 mg total) by mouth 4 (four) times daily as needed (nausea  and vomiting). (Patient not taking: Reported on 03/15/2017) 30 tablet 3  . [DISCONTINUED] topiramate (TOPAMAX) 25 MG tablet Take 25 mg by mouth 2 (two) times daily.     Allergies  Allergen Reactions  . Hydroxacen [Hydroxyzine] Itching and Nausea And Vomiting  . Vicodin [Hydrocodone-Acetaminophen] Itching, Nausea And Vomiting and Other (See Comments)    headaches    I have reviewed the past Medical Hx, Surgical Hx, Social Hx, Allergies and Medications.   REVIEW OF SYSTEMS All systems reviewed and are negative for acute change except as noted in the HPI.   OBJECTIVE BP 112/63 (BP Location: Right Arm)   Pulse 97   Temp 97.9 F (36.6 C)   Resp 18   Ht 5\' 6"  (1.676 m)   Wt 97.1 kg (214 lb)   LMP 07/21/2016 (Approximate)   SpO2 99%   BMI 34.54 kg/m    PHYSICAL EXAM Constitutional: Well-developed, well-nourished female in no acute distress.  Cardiovascular: normal rate and rhythm, pulses intact Respiratory: normal rate and effort.  GI: Abd soft, non-tender, gravid  appropriate for gestational age.  MS: Extremities nontender, no edema, normal ROM Neurologic: Alert and oriented x 4.  GU: Neg CVAT. Pelvic: NEFG, thick white discharge, no blood, cervix clean and closed. No CMT Psych: normal mood and affect  Dilation: Closed Effacement (%): Thick Station: Ballotable Exam by:: Doroteo Glassman, DO  LAB RESULTS Results for orders placed or performed during the hospital encounter of 03/26/17 (from the past 24 hour(s))  Urinalysis, Routine w reflex microscopic     Status: Abnormal   Collection Time: 03/26/17  8:04 PM  Result Value Ref Range   Color, Urine YELLOW YELLOW   APPearance CLEAR CLEAR   Specific Gravity, Urine 1.023 1.005 - 1.030   pH 5.0 5.0 - 8.0   Glucose, UA NEGATIVE NEGATIVE mg/dL   Hgb urine dipstick NEGATIVE NEGATIVE   Bilirubin Urine NEGATIVE NEGATIVE   Ketones, ur 80 (A) NEGATIVE mg/dL   Protein, ur 30 (A) NEGATIVE mg/dL   Nitrite NEGATIVE NEGATIVE   Leukocytes, UA MODERATE (A) NEGATIVE   RBC / HPF 0-5 0 - 5 RBC/hpf   WBC, UA 0-5 0 - 5 WBC/hpf   Bacteria, UA RARE (A) NONE SEEN   Squamous Epithelial / LPF 0-5 (A) NONE SEEN   Mucus PRESENT   Wet prep, genital     Status: Abnormal   Collection Time: 03/26/17  8:32 PM  Result Value Ref Range   Yeast Wet Prep HPF POC PRESENT (A) NONE SEEN   Trich, Wet Prep NONE SEEN NONE SEEN   Clue Cells Wet Prep HPF POC NONE SEEN NONE SEEN   WBC, Wet Prep HPF POC MANY (A) NONE SEEN   Sperm NONE SEEN     IMAGING [place] Mfm Ob Follow Up  Result Date: 03/15/2017 ----------------------------------------------------------------------  OBSTETRICS REPORT                      (Signed Final 03/15/2017 04:21 pm) ---------------------------------------------------------------------- Patient Info  ID #:       [id number]                         D.O.B.:   [birth year] (36 yrs)  Name:       Hannah Cook                Visit Date:  03/15/2017 03:31 pm ----------------------------------------------------------------------  Performed By  Performed By:     Percell Boston  Secondary Phy.:   [facility]                                                             [facility]  Attending:        Ledon Snare MD         Address:          [facility]                                                             [address]                                                             [place], [place]                                                             [address]  Referred By:      Carlyon Shadow               Location:         [facility]  CARSON NP  Ref. Address:     [address]                    [place]                    [address] ---------------------------------------------------------------------- Orders   #  Description                                 Code   1  [place] MFM OB FOLLOW UP                         [address].60  ----------------------------------------------------------------------   #  Ordered By               Order #        Accession #    Episode #   1  MARK Ezzard Standing              [id number]      [id number]     [id number]  ---------------------------------------------------------------------- Indications   [redacted] weeks gestation of pregnancy                Z3A.36   Seizure disorder (Lamictal)                    O99.350 G40.909   Medical complication of pregnancy (brain       O26.90   aneurysm) 2002   Smoking complicating pregnancy, second         O99.332   trimester   Advanced maternal age multigravida 27+,        O64.523   third trimester (low risk NIPS)  ---------------------------------------------------------------------- OB History  Gravidity:    6         Term:   3        Prem:    0        SAB:   1  TOP:          1       Ectopic:  0        Living: 3 ---------------------------------------------------------------------- Fetal Evaluation  Num Of Fetuses:     1  Fetal Heart         157  Rate(bpm):  Cardiac Activity:   Observed  Presentation:       Cephalic  Placenta:           Posterior, above cervical os  P. Cord Insertion:  Visualized, central  Amniotic Fluid  AFI FV:      Subjectively within normal limits  AFI Sum(cm)     %Tile       Largest Pocket(cm)  13.69           49          4.54  RUQ(cm)       RLQ(cm)       LUQ(cm)        LLQ(cm)  4.54          2.65          3.39           3.11 ---------------------------------------------------------------------- Biometry  BPD:      86.9  mm     G. Age:  35w 21d  32  %    CI:        76.92   %   70 - 86                                                          FL/HC:      21.5   %   20.1 - 22.1  HC:      313.8  mm     G. Age:  35w 1d          8  %    HC/AC:      0.97       0.93 - 1.11  AC:      322.5  mm     G. Age:  36w 1d         64  %    FL/BPD:     77.7   %   71 - 87  FL:       67.5  mm     G. Age:  34w 5d         17  %    FL/AC:      20.9   %   20 - 24  HUM:      56.3  mm     G. Age:  32w 5d          8  %  Est. FW:    2710  gm          6 lb      55  % ---------------------------------------------------------------------- Gestational Age  LMP:           33w 6d       Date:   07/21/16                 EDD:   04/27/17  U/S Today:     35w 2d                                        EDD:   04/17/17  Best:          36w 0d    Det. By:   U/S  (10/26/16)          EDD:   04/12/17 ---------------------------------------------------------------------- Anatomy  Cranium:               Appears normal         Aortic Arch:            Previously seen  Cavum:                 Previously seen        Ductal Arch:            Previously seen  Ventricles:            Appears normal         Diaphragm:              Previously seen  Choroid Plexus:        Previously seen         Stomach:                Appears normal, left  sided  Cerebellum:            Previously seen        Abdomen:                Previously seen  Posterior Fossa:       Previously seen        Abdominal Wall:         Previously seen  Nuchal Fold:           Not applicable (>20    Cord Vessels:           Previously seen                         wks GA)  Face:                  Orbits and profile     Kidneys:                Appear normal                         previously seen  Lips:                  Previously seen        Bladder:                Appears normal  Thoracic:              Appears normal         Spine:                  Previously seen  Heart:                 Appears normal         Upper Extremities:      Previously seen                         (4CH, axis, and situs  RVOT:                  Appears normal         Lower Extremities:      Previously seen  LVOT:                  Appears normal  Other:  Heels and 5th digit previously visualized. Nasal bone previously          visualized. Open hands prev. visualized. Female gender. ---------------------------------------------------------------------- Cervix Uterus Adnexa  Cervix  Not visualized (advanced GA >29wks) ---------------------------------------------------------------------- Impression  Singleton intrauterine pregnancy at [redacted] weeks gestation with  fetal cardiac activity  Cephalic presentation  Normal appearing fetal growth and amniotic fluid volume ---------------------------------------------------------------------- Recommendations  Follow-up ultrasounds as clinically indicated. ----------------------------------------------------------------------                   Ledon Snare, MD Electronically Signed Final Report   03/15/2017 04:21 pm ----------------------------------------------------------------------   MAU COURSE Vitals and nursing notes reviewed I have ordered labs and  reviewed them Yeast present on physical exam and confirmed on wet prep UA with leukocytes but rare bacteria and WBC; will send for culture Declined STD testing   I personally reviewed the patient's NST today, found to be REACTIVE. 130 bpm, mod var, +accels, no decels. CTX: Irregular.  MDM Plan of care reviewed with patient, including labs and tests ordered and  medical treatment.   ASSESSMENT 1. Yeast vaginitis   2. Vulvar irritation     PLAN Discharge home in stable condition Rx for miconazole vaginal suppository for 5 days; if this does not work has Rx for Diflucan PO x1 Labor precautions and fetal kick counts reviewed Counseled on return precautions Follow-up with OB provider Handout given   Caryl Ada, DO [facility], [facility] 03/26/2017, 8:11 PM

## 2017-03-26 NOTE — Discharge Instructions (Signed)

## 2017-03-26 NOTE — MAU Note (Signed)
Pt reports vaginal itching and irritations and burning that started yesterday. Recently changed soap and detergent. Thinks it is related to that. Pt denies vaginal discharge, bleeding or LOF. Pt denies painful contractions. Reports good fetal movement.

## 2017-03-28 LAB — CULTURE, OB URINE

## 2017-04-01 ENCOUNTER — Emergency Department (HOSPITAL_COMMUNITY)
Admission: EM | Admit: 2017-04-01 | Discharge: 2017-04-01 | Disposition: A | Discharge status: Home / Self Care | Payer: Medicaid Other | Attending: Emergency Medicine | Admitting: Emergency Medicine

## 2017-04-01 ENCOUNTER — Encounter (HOSPITAL_COMMUNITY): Payer: Self-pay | Admitting: Emergency Medicine

## 2017-04-01 DIAGNOSIS — Z5321 Procedure and treatment not carried out due to patient leaving prior to being seen by health care provider: Secondary | ICD-10-CM | POA: Diagnosis not present

## 2017-04-01 DIAGNOSIS — Z3493 Encounter for supervision of normal pregnancy, unspecified, third trimester: Secondary | ICD-10-CM | POA: Diagnosis present

## 2017-04-01 DIAGNOSIS — Z5181 Encounter for therapeutic drug level monitoring: Secondary | ICD-10-CM | POA: Diagnosis not present

## 2017-04-01 NOTE — ED Notes (Signed)
Pt called in waiting to go to a room, this RN was informed by the tech that the pt left because she had to go to an appointment.

## 2017-04-01 NOTE — ED Notes (Signed)
Dr. Clarene [facility] made aware that patient is here for lamictal level to be drawn, MD gave verbal order to this RN for lab.

## 2017-04-01 NOTE — ED Triage Notes (Addendum)
Pt reports she is pregnant, due on 10/24, was sent here by her doctor at [facility] to have her lamictal levels evaluated. Pt has no other complaints.

## 2017-04-02 LAB — LAMOTRIGINE LEVEL: Lamotrigine Lvl: 3.6 ug/mL (ref 2.0–20.0)

## 2017-04-08 ENCOUNTER — Encounter (HOSPITAL_COMMUNITY): Payer: Self-pay

## 2017-04-08 ENCOUNTER — Inpatient Hospital Stay (HOSPITAL_COMMUNITY)
Admission: AD | Admit: 2017-04-08 | Discharge: 2017-04-10 | DRG: 807 | Disposition: A | Discharge status: Home / Self Care | Payer: Medicaid Other | Source: Ambulatory Visit | Attending: Obstetrics & Gynecology | Admitting: Obstetrics & Gynecology

## 2017-04-08 ENCOUNTER — Inpatient Hospital Stay (HOSPITAL_COMMUNITY): Payer: Medicaid Other | Admitting: Anesthesiology

## 2017-04-08 DIAGNOSIS — D649 Anemia, unspecified: Secondary | ICD-10-CM | POA: Diagnosis present

## 2017-04-08 DIAGNOSIS — Z3A39 39 weeks gestation of pregnancy: Secondary | ICD-10-CM

## 2017-04-08 DIAGNOSIS — O99214 Obesity complicating childbirth: Secondary | ICD-10-CM | POA: Diagnosis present

## 2017-04-08 DIAGNOSIS — E669 Obesity, unspecified: Secondary | ICD-10-CM | POA: Diagnosis present

## 2017-04-08 DIAGNOSIS — G40909 Epilepsy, unspecified, not intractable, without status epilepticus: Secondary | ICD-10-CM | POA: Diagnosis present

## 2017-04-08 DIAGNOSIS — O9902 Anemia complicating childbirth: Secondary | ICD-10-CM | POA: Diagnosis present

## 2017-04-08 DIAGNOSIS — O99354 Diseases of the nervous system complicating childbirth: Principal | ICD-10-CM | POA: Diagnosis present

## 2017-04-08 DIAGNOSIS — O4693 Antepartum hemorrhage, unspecified, third trimester: Secondary | ICD-10-CM | POA: Diagnosis present

## 2017-04-08 DIAGNOSIS — Z87891 Personal history of nicotine dependence: Secondary | ICD-10-CM | POA: Diagnosis not present

## 2017-04-08 LAB — RPR: RPR: NONREACTIVE

## 2017-04-08 LAB — CBC
HEMATOCRIT: 32.3 % — AB (ref 36.0–46.0)
HEMOGLOBIN: 11.6 g/dL — AB (ref 12.0–15.0)
MCH: 33.3 pg (ref 26.0–34.0)
MCHC: 35.9 g/dL (ref 30.0–36.0)
MCV: 92.8 fL (ref 78.0–100.0)
Platelets: 238 10*3/uL (ref 150–400)
RBC: 3.48 MIL/uL — AB (ref 3.87–5.11)
RDW: 13.5 % (ref 11.5–15.5)
WBC: 7.4 10*3/uL (ref 4.0–10.5)

## 2017-04-08 LAB — RAPID URINE DRUG SCREEN, HOSP PERFORMED
Amphetamines: NOT DETECTED
BARBITURATES: NOT DETECTED
BENZODIAZEPINES: NOT DETECTED
Cocaine: NOT DETECTED
Opiates: NOT DETECTED
Tetrahydrocannabinol: NOT DETECTED

## 2017-04-08 LAB — TYPE AND SCREEN
ABO/RH(D): O POS
ANTIBODY SCREEN: NEGATIVE

## 2017-04-08 LAB — GC/CHLAMYDIA PROBE AMP (~~LOC~~) NOT AT ARMC
CHLAMYDIA, DNA PROBE: NEGATIVE
Neisseria Gonorrhea: NEGATIVE

## 2017-04-08 LAB — T4, FREE: FREE T4: 0.76 ng/dL (ref 0.61–1.12)

## 2017-04-08 LAB — GROUP B STREP BY PCR: GROUP B STREP BY PCR: POSITIVE — AB

## 2017-04-08 MED ORDER — OXYTOCIN 40 UNITS IN LACTATED RINGERS INFUSION - SIMPLE MED
1.0000 m[IU]/min | INTRAVENOUS | Status: DC
  Administered 2017-04-08: 2 m[IU]/min via INTRAVENOUS

## 2017-04-08 MED ORDER — LAMOTRIGINE 200 MG PO TABS
200.0000 mg | ORAL_TABLET | Freq: Two times a day (BID) | ORAL | Status: DC
  Administered 2017-04-08: 200 mg via ORAL
  Filled 2017-04-08 (×4): qty 1

## 2017-04-08 MED ORDER — DEXTROSE 5 % IV SOLN
5.0000 10*6.[IU] | Freq: Once | INTRAVENOUS | Status: AC
  Administered 2017-04-08: 5 10*6.[IU] via INTRAVENOUS
  Filled 2017-04-08: qty 5

## 2017-04-08 MED ORDER — COCONUT OIL OIL: 1.0000 "application " | TOPICAL_OIL | Status: DC | PRN

## 2017-04-08 MED ORDER — PHENYLEPHRINE 40 MCG/ML (10ML) SYRINGE FOR IV PUSH (FOR BLOOD PRESSURE SUPPORT)
80.0000 ug | PREFILLED_SYRINGE | INTRAVENOUS | Status: DC | PRN
Start: 2017-04-08 — End: 2017-04-08
  Filled 2017-04-08: qty 10
  Filled 2017-04-08: qty 5

## 2017-04-08 MED ORDER — DIBUCAINE 1 % RE OINT: 1.0000 "application " | TOPICAL_OINTMENT | RECTAL | Status: DC | PRN

## 2017-04-08 MED ORDER — ONDANSETRON HCL 4 MG/2ML IJ SOLN: 4.0000 mg | INTRAMUSCULAR | Status: DC | PRN

## 2017-04-08 MED ORDER — ZOLPIDEM TARTRATE 5 MG PO TABS: 5.0000 mg | ORAL_TABLET | Freq: Every evening | ORAL | Status: DC | PRN

## 2017-04-08 MED ORDER — LACTATED RINGERS IV SOLN
500.0000 mL | Freq: Once | INTRAVENOUS | Status: AC
  Administered 2017-04-08: 500 mL via INTRAVENOUS

## 2017-04-08 MED ORDER — DIPHENHYDRAMINE HCL 50 MG/ML IJ SOLN: 12.5000 mg | INTRAMUSCULAR | Status: DC | PRN

## 2017-04-08 MED ORDER — PENICILLIN G POT IN DEXTROSE 60000 UNIT/ML IV SOLN
3.0000 10*6.[IU] | INTRAVENOUS | Status: DC
  Administered 2017-04-08: 3 10*6.[IU] via INTRAVENOUS
  Filled 2017-04-08 (×4): qty 50

## 2017-04-08 MED ORDER — SOD CITRATE-CITRIC ACID 500-334 MG/5ML PO SOLN
30.0000 mL | ORAL | Status: DC | PRN
  Administered 2017-04-08: 30 mL via ORAL
  Filled 2017-04-08 (×2): qty 15

## 2017-04-08 MED ORDER — LACTATED RINGERS IV SOLN
500.0000 mL | INTRAVENOUS | Status: DC | PRN
Start: 2017-04-08 — End: 2017-04-08

## 2017-04-08 MED ORDER — EPHEDRINE 5 MG/ML INJ
10.0000 mg | INTRAVENOUS | Status: DC | PRN
  Filled 2017-04-08: qty 2

## 2017-04-08 MED ORDER — NICOTINE 21 MG/24HR TD PT24
21.0000 mg | MEDICATED_PATCH | Freq: Every day | TRANSDERMAL | Status: DC
  Filled 2017-04-08 (×3): qty 1

## 2017-04-08 MED ORDER — IBUPROFEN 600 MG PO TABS
600.0000 mg | ORAL_TABLET | Freq: Four times a day (QID) | ORAL | Status: DC
  Administered 2017-04-08 – 2017-04-10 (×8): 600 mg via ORAL
  Filled 2017-04-08 (×9): qty 1

## 2017-04-08 MED ORDER — SIMETHICONE 80 MG PO CHEW: 80.0000 mg | CHEWABLE_TABLET | ORAL | Status: DC | PRN

## 2017-04-08 MED ORDER — WITCH HAZEL-GLYCERIN EX PADS
1.0000 "application " | MEDICATED_PAD | CUTANEOUS | Status: DC | PRN
  Administered 2017-04-09 (×2): 1 via TOPICAL

## 2017-04-08 MED ORDER — LAMOTRIGINE 100 MG PO TABS
200.0000 mg | ORAL_TABLET | Freq: Two times a day (BID) | ORAL | Status: DC
  Administered 2017-04-08 – 2017-04-10 (×4): 200 mg via ORAL
  Filled 2017-04-08 (×4): qty 2

## 2017-04-08 MED ORDER — FENTANYL 2.5 MCG/ML BUPIVACAINE 1/10 % EPIDURAL INFUSION (WH - ANES)
14.0000 mL/h | INTRAMUSCULAR | Status: DC | PRN
  Administered 2017-04-08: 14 mL/h via EPIDURAL
  Filled 2017-04-08: qty 100

## 2017-04-08 MED ORDER — OXYTOCIN 40 UNITS IN LACTATED RINGERS INFUSION - SIMPLE MED
2.5000 [IU]/h | INTRAVENOUS | Status: DC
  Administered 2017-04-08: 2.5 [IU]/h via INTRAVENOUS
  Filled 2017-04-08: qty 1000

## 2017-04-08 MED ORDER — DIPHENHYDRAMINE HCL 25 MG PO CAPS: 25.0000 mg | ORAL_CAPSULE | Freq: Four times a day (QID) | ORAL | Status: DC | PRN

## 2017-04-08 MED ORDER — LIDOCAINE HCL (PF) 1 % IJ SOLN
INTRAMUSCULAR | Status: DC | PRN
  Administered 2017-04-08 (×2): 6 mL via EPIDURAL

## 2017-04-08 MED ORDER — SODIUM BICARBONATE 8.4 % IV SOLN
INTRAVENOUS | Status: DC | PRN
  Administered 2017-04-08: 10 mL via EPIDURAL

## 2017-04-08 MED ORDER — LIDOCAINE HCL (PF) 1 % IJ SOLN
30.0000 mL | INTRAMUSCULAR | Status: DC | PRN
  Filled 2017-04-08: qty 30

## 2017-04-08 MED ORDER — ONDANSETRON HCL 4 MG/2ML IJ SOLN: 4.0000 mg | Freq: Four times a day (QID) | INTRAMUSCULAR | Status: DC | PRN

## 2017-04-08 MED ORDER — ONDANSETRON HCL 4 MG PO TABS: 4.0000 mg | ORAL_TABLET | ORAL | Status: DC | PRN

## 2017-04-08 MED ORDER — LACTATED RINGERS IV SOLN
INTRAVENOUS | Status: DC
  Administered 2017-04-08 (×3): via INTRAVENOUS

## 2017-04-08 MED ORDER — OXYTOCIN BOLUS FROM INFUSION
500.0000 mL | Freq: Once | INTRAVENOUS | Status: AC
  Administered 2017-04-08: 500 mL via INTRAVENOUS

## 2017-04-08 MED ORDER — OXYCODONE HCL 5 MG PO TABS
5.0000 mg | ORAL_TABLET | Freq: Two times a day (BID) | ORAL | Status: DC | PRN
  Administered 2017-04-08 – 2017-04-09 (×2): 5 mg via ORAL
  Filled 2017-04-08 (×2): qty 1

## 2017-04-08 MED ORDER — SENNOSIDES-DOCUSATE SODIUM 8.6-50 MG PO TABS
2.0000 | ORAL_TABLET | ORAL | Status: DC
  Administered 2017-04-08 – 2017-04-09 (×2): 2 via ORAL
  Filled 2017-04-08: qty 2

## 2017-04-08 MED ORDER — BENZOCAINE-MENTHOL 20-0.5 % EX AERO: 1.0000 "application " | INHALATION_SPRAY | CUTANEOUS | Status: DC | PRN

## 2017-04-08 MED ORDER — TERBUTALINE SULFATE 1 MG/ML IJ SOLN
0.2500 mg | Freq: Once | INTRAMUSCULAR | Status: DC | PRN
Start: 2017-04-08 — End: 2017-04-08
  Filled 2017-04-08: qty 1

## 2017-04-08 MED ORDER — PRENATAL MULTIVITAMIN CH
1.0000 | ORAL_TABLET | Freq: Every day | ORAL | Status: DC
  Administered 2017-04-09 – 2017-04-10 (×2): 1 via ORAL
  Filled 2017-04-08 (×2): qty 1

## 2017-04-08 MED ORDER — PYRIDOXINE HCL 25 MG PO TABS
25.0000 mg | ORAL_TABLET | Freq: Every day | ORAL | Status: DC
  Administered 2017-04-08 – 2017-04-10 (×3): 25 mg via ORAL
  Filled 2017-04-08 (×3): qty 1

## 2017-04-08 MED ORDER — TETANUS-DIPHTH-ACELL PERTUSSIS 5-2.5-18.5 LF-MCG/0.5 IM SUSP: 0.5000 mL | Freq: Once | INTRAMUSCULAR | Status: DC

## 2017-04-08 MED ORDER — PHENYLEPHRINE 40 MCG/ML (10ML) SYRINGE FOR IV PUSH (FOR BLOOD PRESSURE SUPPORT)
80.0000 ug | PREFILLED_SYRINGE | INTRAVENOUS | Status: DC | PRN
  Filled 2017-04-08: qty 5

## 2017-04-08 NOTE — Anesthesia Pain Management Evaluation Note (Signed)
  CRNA Pain Management Visit Note  Patient: Hannah Cook, 36 y.o., female  "Hello I am a member of the anesthesia team at [facility]. We have an anesthesia team available at all times to provide care throughout the hospital, including epidural management and anesthesia for C-section. I don't know your plan for the delivery whether it a natural birth, water birth, IV sedation, nitrous supplementation, doula or epidural, but we want to meet your pain goals."   1.Was your pain managed to your expectations on prior hospitalizations?   Yes   2.What is your expectation for pain management during this hospitalization?     Epidural  3.How can we help you reach that goal? Epidural when in active labor.  Record the patient's initial score and the patient's pain goal.   Pain: 6  Pain Goal: 8 [facility] wants you to be able to say your pain was always managed very well.  Hannah Cook 04/08/2017

## 2017-04-08 NOTE — MAU Note (Signed)
Pt states she woke up a little before 4am and had a large amount of vaginal bleeding. Pt states she had some cramping before the bleeding started, but denies pain at this time. Reports good fetal movement before she went to bed but has not felt anything since the bleeding started. Pt noted to have a towel with a moderate amount of blood in triage.

## 2017-04-08 NOTE — Progress Notes (Signed)
Patient complains of SOB and numbness. Patient's HOB elevated and oxygen placed for patient comfort. Pulse ox reading 100.

## 2017-04-08 NOTE — Anesthesia Procedure Notes (Signed)
Epidural Patient location during procedure: OB Start time: 04/08/2017 10:46 AM End time: 04/08/2017 10:50 AM  Staffing Anesthesiologist: Leilani AbleHATCHETT, Cherrise Occhipinti Performed: anesthesiologist   Preanesthetic Checklist Completed: patient identified, surgical consent, pre-op evaluation, timeout performed, IV checked, risks and benefits discussed and monitors and equipment checked  Epidural Patient position: sitting Prep: site prepped and draped and DuraPrep Patient monitoring: continuous pulse ox and blood pressure Approach: midline Location: L3-L4 Injection technique: LOR air  Needle:  Needle type: Tuohy  Needle gauge: 17 G Needle length: 9 cm and 9 Needle insertion depth: 6 cm Catheter type: closed end flexible Catheter size: 19 Gauge Catheter at skin depth: 11 cm Test dose: negative and Other  Assessment Sensory level: T9 Events: blood not aspirated, injection not painful, no injection resistance, negative IV test and no paresthesia

## 2017-04-08 NOTE — Anesthesia Preprocedure Evaluation (Signed)
Anesthesia Evaluation  Patient identified by MRN, date of birth, ID band Patient awake    Reviewed: Allergy & Precautions, H&P , NPO status , Patient's Chart, lab work & pertinent test results  Airway Mallampati: II  TM Distance: >3 FB Neck ROM: full    Dental no notable dental hx.    Pulmonary former smoker,    Pulmonary exam normal breath sounds clear to auscultation       Cardiovascular negative cardio ROS Normal cardiovascular exam Rhythm:regular Rate:Normal     Neuro/Psych    GI/Hepatic negative GI ROS, Neg liver ROS,   Endo/Other    Renal/GU negative Renal ROS     Musculoskeletal   Abdominal (+) + obese,   Peds  Hematology  (+) anemia ,   Anesthesia Other Findings   Reproductive/Obstetrics (+) Pregnancy                             Anesthesia Physical Anesthesia Plan  ASA: II  Anesthesia Plan: Epidural   Post-op Pain Management:    Induction:   PONV Risk Score and Plan:   Airway Management Planned:   Additional Equipment:   Intra-op Plan:   Post-operative Plan:   Informed Consent: I have reviewed the patients History and Physical, chart, labs and discussed the procedure including the risks, benefits and alternatives for the proposed anesthesia with the patient or authorized representative who has indicated his/her understanding and acceptance.     Plan Discussed with:   Anesthesia Plan Comments:         Anesthesia Quick Evaluation

## 2017-04-08 NOTE — Progress Notes (Signed)
Hannah Cook is a 36 y.o. [phone] at 254w3d admitted for latent labor with vaginal bleeding  Subjective: Patient was resting comfortably in bed with no complaints  Objective: BP 118/71   Pulse (!) 103   Temp 98.6 F (37 C) (Oral)   Resp 16   Ht 5\' 6"  (1.676 m)   Wt 214 lb (97.1 kg)   LMP 07/21/2016 (Approximate)   SpO2 99%   BMI 34.54 kg/m  No intake/output data recorded. No intake/output data recorded.  FHT:  FHR: 135 bpm, variability: minimal ,  accelerations:  Abscent,  decelerations:  Absent UC:   irregular, every 2-5 minutes SVE:   Dilation: 2 Effacement (%): 90 Station: -2 Exam by:: Dr. Nira Retortegele  Labs: Lab Results  Component Value Date   WBC 7.4 04/08/2017   HGB 11.6 (L) 04/08/2017   HCT 32.3 (L) 04/08/2017   MCV 92.8 04/08/2017   PLT 238 04/08/2017    Assessment / Plan: Augmentation of labor, progressing well  Labor: Progressing on Pitocin, will continue to increase then AROM Fetal Wellbeing:  Category I Pain Control:  Epidural I/D:  n/a Anticipated MOD:  NSVD  Hannah Cook 04/08/2017, 11:13 AM

## 2017-04-08 NOTE — H&P (Signed)
LABOR AND DELIVERY ADMISSION HISTORY AND PHYSICAL NOTE  Hannah Cook is a 36 y.o. female [phone] with IUP at [redacted]w[redacted]d by 2nd trimester U/S presenting for contractions and vaginal bleeding. Reports cramping contractions starting last night before she went to bed, then later woke up and noticed vaginal bleeding, like a period. She reports positive fetal movement. She denies leakage of fluid.  Prenatal History/Complications: PNC at [facility] starting at 16 week. Last visit at 31 weeks Pregnancy complications:  - Seizure disorder: on lamotrigine 200 mg BID, followed by Neurology at [facility] - Hx of brain aneurysm w/ SAH s/p coiling in 2002 - has had SVD since then, cleared by MFM to have vaginal delivery - Obesity - Hx of hyperthyroidism, s/p ablation ~5 yr ago per patient - Tobacco smoking: reports quitting during pregnancy - AMA  Past Medical History: Past Medical History:  Diagnosis Date  . Aneurysm (HCC) 2002  . Asthma   . Hypothyroidism   . Panic attack   . 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 Thyroid disease     Past Surgical History: Past Surgical History:  Procedure Laterality Date  . ANEURYSM COILING    . BRAIN SURGERY    . TONSILLECTOMY      Obstetrical History: OB History    Gravida Para Term Preterm AB Living   6 3 3   2 3    SAB TAB Ectopic Multiple Live Births   1 1 0 0 3      Social History: Social History   Social History  . Marital status: Single    Spouse name: N/A  . Number of children: N/A  . Years of education: N/A   Social History Main Topics  . Smoking status: Former Smoker    Packs/day: 0.00    Years: 18.00    Quit date: 11/24/2016  . Smokeless tobacco: Never Used  . Alcohol use No  . Drug use: No  . Sexual activity: Yes    Birth control/ protection: Condom     Comment: With men only   Other Topics Concern  . None   Social History Narrative  . None    Family History: Family History  Problem Relation Age of  Onset  . Thyroid disease Mother   . Aortic aneurysm Mother        heart  . Diabetes Mother   . Hypertension Maternal Aunt   . Hypertension Maternal Uncle   . Hypertension Maternal Grandmother   . Diabetes Maternal Grandmother   . Hypertension Sister   . Diabetes Sister   . Asthma Daughter     Allergies: Allergies  Allergen Reactions  . Hydroxacen [Hydroxyzine] Itching and Nausea And Vomiting  . Vicodin [Hydrocodone-Acetaminophen] Itching, Nausea And Vomiting and Other (See Comments)    headaches    Prescriptions Prior to Admission  Medication Sig Dispense Refill Last Dose  . lamoTRIgine (LAMICTAL) 100 MG tablet Take 200 mg by mouth 2 (two) times daily.  1 04/07/2017 at Unknown time  . nicotine (NICODERM CQ - DOSED IN MG/24 HOURS) 21 mg/24hr patch Place 1 patch (21 mg total) onto the skin daily. 28 patch 0 03/26/2017 at Unknown time  . ondansetron (ZOFRAN ODT) 4 MG disintegrating tablet Take 1 tablet (4 mg total) by mouth every 6 (six) hours as needed for nausea. (Patient not taking: Reported on 03/15/2017) 20 tablet 4 More than a month at Unknown time  . pyridOXINE (VITAMIN B-6) 25 MG tablet Take  1 tablet (25 mg total) by mouth 4 (four) times daily as needed (nausea and vomiting). (Patient not taking: Reported on 03/15/2017) 30 tablet 3 More than a month at Unknown time     Review of Systems  All systems reviewed and negative except as stated in HPI  Physical Exam Blood pressure 126/78, pulse 88, temperature 98 F (36.7 C), temperature source Oral, resp. rate 18, last menstrual period 07/21/2016, SpO2 99 %. General appearance: NAD, appears comfortable Lungs: normal WOB Heart: regular rate and rhythm Abdomen: soft, non-tender; gravid, size appropriate for GA Extremities: No calf swelling or tenderness Presentation: cephalic by SVE Fetal monitoring: baseline rate 140, mod variability, +acel, no decel Uterine activity: ctx q 3-4 min Dilation: 2 Effacement (%): 90 Station:  -2 Exam by:: L.Leftewich-Kirby,CNM   Per Agapito GamesLisa Leftewich-Kirby, CNM, moderate pooling of blood noted on speculum exam  Prenatal labs: ABO, Rh: O/Positive/-- (04/30 0000) Antibody: Negative (04/30 0000) Rubella: Immune (04/30 0000) RPR: Non Reactive (08/15 1011)  HBsAg: Negative (04/30 0000)  HIV: Non-reactive (04/30 0000)  GC/Chlamydia: negative 01/08/17 GBS:   N/A 2 hr GTT: normal Genetic screening:  Low risk NIPS Anatomy US: normal anatomy  Prenatal Transfer Tool  Maternal Diabetes: No Genetic Screening: Normal Maternal Ultrasounds/Referrals: Normal Fetal Ultrasounds or other Referrals:  None Maternal Substance Abuse:  declines Significant Maternal Medications:  Meds include: Other: Lamotrigine Significant Maternal Lab Results: None  Results for orders placed or performed during the hospital encounter of 04/08/17 (from the past 24 hour(s))  CBC   Collection Time: 04/08/17  4:50 AM  Result Value Ref Range   WBC 7.4 4.0 - 10.5 K/uL   RBC 3.48 (L) 3.87 - 5.11 MIL/uL   Hemoglobin 11.6 (L) 12.0 - 15.0 g/dL   HCT 84.632.3 (L) 96.236.0 - 95.246.0 %   MCV 92.8 78.0 - 100.0 fL   MCH 33.3 26.0 - 34.0 pg   MCHC 35.9 30.0 - 36.0 g/dL   RDW 84.113.5 32.411.5 - 40.115.5 %   Platelets 238 150 - 400 K/uL    Patient Active Problem List   Diagnosis Date Noted  . Vaginal bleeding in pregnancy, third trimester 04/08/2017  . Tobacco smoking complicating pregnancy 01/30/2017  . History of intracranial aneurysm s/p coiling 11/05/2016  . History of hypothyroidism 11/01/2016  . Advanced maternal age in multigravida 10/26/2016  . Supervision of high risk pregnancy, antepartum 10/21/2014  . Obesity (BMI 30.0-34.9) 02/16/2014  . Seizure disorder in pregnancy, antepartum (HCC) 05/20/2013    Assessment: Hannah Cook is a 36 y.o. [id number] at 4676w3d here for contractions and vaginal bleeding  #Labor: Appears to be in latent labor. Expectant management for now. #Pain: Per patient's request #FWB: Cat I #ID:  GBS  unknown, no IAP indicated given GA >37. GBS PCR and GC/Chlamydia probe collected #MOF: breast #MOC:Depo  #Seizure disorder: continue home lamotrigine 200 mg BID #Brain aneurism: s/p coiling in 2002. Has had 1 SVD since. Cleared by MFM to have vaginal deliveries.  #Limited PNC: check UDS. SW consult postpartum  Kandra NicolasJulie P Santia Labate 04/08/2017, 5:46 AM

## 2017-04-08 NOTE — Progress Notes (Signed)
1413 Patient requesting to speak with anesthesia. Stating that it hurts being so numb from epidural. Dr. Lilli LightHatchet notified. New orders to turn off epidural at this time. Epidural infusion discontinued.

## 2017-04-08 NOTE — Anesthesia Postprocedure Evaluation (Signed)
Anesthesia Post Note  Patient: Hannah Cook  Procedure(s) Performed: AN AD HOC LABOR EPIDURAL     Patient location during evaluation: Mother Baby Anesthesia Type: Epidural Level of consciousness: awake and alert Pain management: pain level controlled Vital Signs Assessment: post-procedure vital signs reviewed and stable Respiratory status: spontaneous breathing, nonlabored ventilation and respiratory function stable Cardiovascular status: stable Postop Assessment: no headache, no backache and epidural receding Anesthetic complications: no    Last Vitals:  Vitals:   04/08/17 1830 04/08/17 2147  BP: 120/67 123/65  Pulse: 80 86  Resp: 18 18  Temp: 37 C 36.8 C  SpO2:      Last Pain:  Vitals:   04/08/17 2147  TempSrc: Oral  PainSc: 10-Worst pain ever   Pain Goal:                 EchoStarMERRITT,Tenelle Andreason

## 2017-04-09 LAB — T3, FREE: T3, Free: 3.1 pg/mL (ref 2.0–4.4)

## 2017-04-09 MED ORDER — POLYETHYLENE GLYCOL 3350 17 G PO PACK
17.0000 g | PACK | Freq: Every day | ORAL | Status: DC
  Administered 2017-04-09 – 2017-04-10 (×2): 17 g via ORAL
  Filled 2017-04-09 (×2): qty 1

## 2017-04-09 MED ORDER — BISACODYL 10 MG RE SUPP
10.0000 mg | Freq: Once | RECTAL | Status: DC
  Filled 2017-04-09: qty 1

## 2017-04-09 NOTE — Progress Notes (Signed)
POSTPARTUM PROGRESS NOTE  Post Partum Day 1  Subjective:  Hannah Cook is a 36 y.o. [id number] s/p SVD at 984w3d.  No acute events overnight.  Pt denies problems with ambulating, voiding or po intake.  She denies nausea or vomiting.  Pain is moderately controlled.  Lochia Small.   Objective: Blood pressure 123/65, pulse 86, temperature 98.2 F (36.8 C), temperature source Oral, resp. rate 18, height 5\' 6"  (1.676 m), weight 214 lb (97.1 kg), last menstrual period 07/21/2016, SpO2 100 %, unknown if currently breastfeeding.  Physical Exam:  General: alert, cooperative and no distress Chest: no respiratory distress Heart:regular rate, distal pulses intact Abdomen: soft, nontender,  Uterine Fundus: firm, appropriately tender DVT Evaluation: No calf swelling or tenderness Extremities: no edema   Recent Labs  04/08/17 0450  HGB 11.6*  HCT 32.3*    Assessment/Plan: Hannah Cook is a 36 y.o. [id number] s/p SVD at 6084w3d   PPD#1 - Doing well Contraception: Depo Feeding: breast SW consul Dispo: Plan for discharge tomorrow.   LOS: 1 day   Kandra NicolasJulie P DegeleMD 04/09/2017, 8:46 AM

## 2017-04-09 NOTE — Lactation Note (Signed)
This note was copied from a baby's chart. Lactation Consultation Note  Patient Name: Hannah Cook Reason for consult: Initial assessment   Initial consult with mom of 22 hour old infant. Infant with 5 BF for 10-15 minutes, 4 BF attempts, formula x 1 of 8 cc via bottle, 2 voids and 2 stools since birth. Infant weight 7 lb 8.3 oz. LATCH scores 7-8. Infant currently asleep and recently fed a bottle of formula.   Mom reports BF is going well. She reports infant is hurting her with some feedings. Mom reports the RN showed her how to do gently chin tug after latch. Enc mom to call out for assistance as needed. Mom report she gave formula last night after infant fed for an hour and still wanted to feed. Discussed supply and demand, milk coming to volume, LEAD, cluster feeding, and importance of offering breast prior to offering formula. Enc mom to hand express post feeding and to offer infant colostrum via spoon id concerned infant is not getting enough. Mom reports she has been shown how to hand express.   Enc mom to offer breast STS 8-12 x in 24 hours at first feeding cues. Enc mom to use pillow support and head support with feeding. Enc mom to call out for feeding assistance as needed. Discussed with mom that if pain continues with feedings past a few suckles to relatch infant and to call out if not able to get the pain to go away.   Mom is on Lamictal for history of seizures. Mom reports she was told she was told not to BF on Lamictal with her older son, discussed with mom that Lamictal (L2) is considered safe with BF Per Bobbye Mortonhomas Hale and Medication and Mother's Milk 2017. Mom is no longer on Nicoderm patches. Mom reports she has a history of Thyroid disease and took Iodine and it is now normal.   BF resources Handout and LC Brochure given, mom informed of IP/OP services, BF Support Groups and LC phone #. [facility] and does not have a pump at home. Mom reports she  does not have any questions/concerns at this time. Enc mom to call out for feeding assitance as needed.      Maternal Data Formula Feeding for Exclusion: Yes Reason for exclusion: Mother's choice to formula and breast feed on admission Has patient been taught Hand Expression?: Yes Does the patient have breastfeeding experience prior to this delivery?: Yes  Feeding Feeding Type: Breast Fed Length of feed: 10 min  LATCH Score                   Interventions Interventions: Breast feeding basics reviewed;Support pillows;Hand express  Lactation Tools Discussed/Used WIC Program: Yes   Consult Status Consult Status: Follow-up Date: 04/10/17 Follow-up type: In-patient    Hannah Cook Cook, 2:03 PM

## 2017-04-09 NOTE — Progress Notes (Signed)
CSW received consult for limited PNC and FOB incarcerated.  CSW completed chart review and notes that MOB stared PNC at the [facility] at approximately 11.[redacted] weeks pregnant and had more than 3 prenatal visits.  Therefore, this does not qualify for drug screening of infant or an automatic CSW consult.  Please consult CSW by MOB's request, if current concerns arise, or if MOB scores 10 or greater/yes to question 10 on the Edinburgh Postnatal Depression Scale.   

## 2017-04-10 MED ORDER — SENNOSIDES-DOCUSATE SODIUM 8.6-50 MG PO TABS: 2.0000 | ORAL_TABLET | Freq: Every evening | ORAL | 0 refills | Status: AC | PRN

## 2017-04-10 MED ORDER — IBUPROFEN 600 MG PO TABS: 600.0000 mg | ORAL_TABLET | ORAL | 1 refills | Status: DC | PRN

## 2017-04-10 NOTE — Discharge Summary (Signed)
OB Discharge Summary     Patient Name: Hannah Cook DOB: [birth year] MRN: [id number]  Date of admission: 04/08/2017 Delivering MD: Caryl Ada Y   Date of discharge: 04/10/2017  Admitting diagnosis: 39.3 WEEKS BLEEDING HEAVY Intrauterine pregnancy: [redacted]w[redacted]d     Secondary diagnosis:  Active Problems:   Vaginal bleeding in pregnancy, third trimester  Additional problems: Seizure disorder in pregnancy, obesity, high risk pregnancy, advanced maternal age, hx of hypothyroidism, hx of intracranial aneurysm, tobacco use disorder in pregnancy     Discharge diagnosis: Term Pregnancy Delivered and none                                                                                                Post partum procedures:none  Augmentation: Pitocin  Complications: None  Hospital course:  Onset of Labor With Vaginal Delivery     36 y.o. yo [id number] at [redacted]w[redacted]d was admitted in Latent Labor on 04/08/2017. Patient had an uncomplicated labor course as follows:  Membrane Rupture Time/Date: 1:15 PM ,04/08/2017   Intrapartum Procedures: Episiotomy: None [1]                                         Lacerations:  None [1]  Patient had a delivery of a Viable infant. 04/08/2017  Information for the patient's newborn:  Lanyah, Spengler [[id number]]  Delivery Method: Vag-Spont    Pateint had an uncomplicated postpartum course.  She is ambulating, tolerating a regular diet, passing flatus, and urinating well. Patient is discharged home in stable condition on 04/10/17.   Physical exam  Vitals:   04/08/17 2147 04/09/17 0915 04/09/17 2239 04/10/17 0614  BP: 123/65 (!) 106/58 115/63 122/68  Pulse: 86 88 78 80  Resp: 18 16 17 16   Temp: 98.2 F (36.8 C) 98.7 F (37.1 C) 98 F (36.7 C) 98.6 F (37 C)  TempSrc: Oral Oral Oral Oral  SpO2:      Weight:      Height:       General: alert, cooperative and no distress Lochia: appropriate Uterine Fundus: firm Incision: none DVT Evaluation: No  evidence of DVT seen on physical exam. No cords or calf tenderness. Labs: Lab Results  Component Value Date   WBC 7.4 04/08/2017   HGB 11.6 (L) 04/08/2017   HCT 32.3 (L) 04/08/2017   MCV 92.8 04/08/2017   PLT 238 04/08/2017   CMP Latest Ref Rng & Units 08/20/2015  Glucose 65 - 99 mg/dL 90  BUN 6 - 20 mg/dL 8  Creatinine 1.30 - 8.65 mg/dL 7.84  Sodium 696 - 295 mmol/L 138  Potassium 3.5 - 5.1 mmol/L 3.2(L)  Chloride 101 - 111 mmol/L 105  CO2 22 - 32 mmol/L 25  Calcium 8.9 - 10.3 mg/dL 9.2  Total Protein 6.5 - 8.1 g/dL 7.6  Total Bilirubin 0.3 - 1.2 mg/dL 0.8  Alkaline Phos 38 - 126 U/L 86  AST 15 - 41 U/L 22  ALT 14 - 54 U/L 17    Discharge  instruction: per After Visit Summary and "Baby and Me Booklet".  After visit meds:  Allergies as of 04/10/2017      Reactions   Hydroxacen [hydroxyzine] Itching, Nausea And Vomiting   Vicodin [hydrocodone-acetaminophen] Itching, Nausea And Vomiting, Other (See Comments)   headaches      Medication List    TAKE these medications   ibuprofen 600 MG tablet Commonly known as:  ADVIL,MOTRIN Take 1 tablet (600 mg total) by mouth every 4 (four) hours as needed.   lamoTRIgine 100 MG tablet Commonly known as:  LAMICTAL Take 200 mg by mouth 2 (two) times daily.   nicotine 21 mg/24hr patch Commonly known as:  NICODERM CQ - dosed in mg/24 hours Place 1 patch (21 mg total) onto the skin daily.   ondansetron 4 MG disintegrating tablet Commonly known as:  ZOFRAN ODT Take 1 tablet (4 mg total) by mouth every 6 (six) hours as needed for nausea.   pyridOXINE 25 MG tablet Commonly known as:  VITAMIN B-6 Take 1 tablet (25 mg total) by mouth 4 (four) times daily as needed (nausea and vomiting).   senna-docusate 8.6-50 MG tablet Commonly known as:  Senokot-S Take 2 tablets by mouth at bedtime as needed for mild constipation.       Diet: routine diet  Activity: Advance as tolerated. Pelvic rest for 6 weeks.   Outpatient follow up:4  weeks Follow up Appt:No future appointments. Follow up Visit:No Follow-up on file.  Postpartum contraception: Depo Provera  Newborn Data: Live born female  Birth Weight: 7 lb 10.8 oz (3481 g) APGAR: 8, 10  Newborn Delivery   Birth date/time:  04/08/2017 15:47:00 Delivery type:  Vaginal, Spontaneous Delivery      Baby Feeding: Breast Disposition:home with mother   04/10/2017 Arlyce Harmanimothy Sharece Fleischhacker, DO  PGY-1, [facility]

## 2017-04-10 NOTE — Discharge Instructions (Signed)

## 2017-04-25 ENCOUNTER — Encounter: Payer: Self-pay | Admitting: General Practice

## 2017-05-20 ENCOUNTER — Ambulatory Visit: Payer: Medicaid Other | Admitting: Family Medicine

## 2017-07-02 ENCOUNTER — Encounter (HOSPITAL_COMMUNITY): Payer: Self-pay | Admitting: Family Medicine

## 2017-07-02 ENCOUNTER — Ambulatory Visit (HOSPITAL_COMMUNITY)
Admission: EM | Admit: 2017-07-02 | Discharge: 2017-07-02 | Disposition: A | Discharge status: Home / Self Care | Payer: Medicaid Other | Attending: Family Medicine | Admitting: Family Medicine

## 2017-07-02 DIAGNOSIS — Z79899 Other long term (current) drug therapy: Secondary | ICD-10-CM | POA: Insufficient documentation

## 2017-07-02 DIAGNOSIS — Z87891 Personal history of nicotine dependence: Secondary | ICD-10-CM | POA: Diagnosis not present

## 2017-07-02 DIAGNOSIS — E039 Hypothyroidism, unspecified: Secondary | ICD-10-CM | POA: Insufficient documentation

## 2017-07-02 DIAGNOSIS — J45909 Unspecified asthma, uncomplicated: Secondary | ICD-10-CM | POA: Insufficient documentation

## 2017-07-02 DIAGNOSIS — Z6834 Body mass index (BMI) 34.0-34.9, adult: Secondary | ICD-10-CM | POA: Diagnosis not present

## 2017-07-02 DIAGNOSIS — F410 Panic disorder [episodic paroxysmal anxiety] without agoraphobia: Secondary | ICD-10-CM | POA: Insufficient documentation

## 2017-07-02 DIAGNOSIS — L02412 Cutaneous abscess of left axilla: Secondary | ICD-10-CM | POA: Diagnosis not present

## 2017-07-02 DIAGNOSIS — E669 Obesity, unspecified: Secondary | ICD-10-CM | POA: Diagnosis not present

## 2017-07-02 MED ORDER — LIDOCAINE-EPINEPHRINE (PF) 2 %-1:200000 IJ SOLN
INTRAMUSCULAR | Status: AC
  Filled 2017-07-02: qty 20

## 2017-07-02 MED ORDER — CEPHALEXIN 500 MG PO CAPS: 500.0000 mg | ORAL_CAPSULE | Freq: Four times a day (QID) | ORAL | 0 refills | Status: DC

## 2017-07-02 MED ORDER — NAPROXEN 500 MG PO TABS: 500.0000 mg | ORAL_TABLET | Freq: Two times a day (BID) | ORAL | 0 refills | Status: AC

## 2017-07-02 NOTE — ED Triage Notes (Signed)
Pt here for abscess in left axilla x 1 week. Denies drainage.

## 2017-07-02 NOTE — Discharge Instructions (Signed)
Abscess drained.  Start Keflex as directed.  Naproxen for pain.  Keep incision site clean and dry.  Daily dressing.  Monitor for any spreading redness, increased warmth, fever, follow-up for reevaluation.

## 2017-07-02 NOTE — ED Provider Notes (Signed)
[facility]    CSN: [id number] Arrival date & time: 07/02/17  1617     History   Chief Complaint Chief Complaint  Patient presents with  . Abscess    HPI Hannah Cook is a 37 y.o. female.   37 year old female comes in for a 1 week history of left axilla abscess.  States she has multiple small ones, but has never gotten this big.  She does warm compress without relief.  States size is getting larger with surrounding redness.  Denies fever, chills, night sweats.      Past Medical History:  Diagnosis Date  . Aneurysm (HCC) 2002  . Asthma   . Hypothyroidism   . Panic attack   . 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 Thyroid disease     Patient Active Problem List   Diagnosis Date Noted  . Vaginal bleeding in pregnancy, third trimester 04/08/2017  . Tobacco smoking complicating pregnancy 01/30/2017  . History of intracranial aneurysm s/p coiling 11/05/2016  . History of hypothyroidism 11/01/2016  . Advanced maternal age in multigravida 10/26/2016  . Supervision of high risk pregnancy, antepartum 10/21/2014  . Obesity (BMI 30.0-34.9) 02/16/2014  . Seizure disorder in pregnancy, antepartum (HCC) 05/20/2013    Past Surgical History:  Procedure Laterality Date  . ANEURYSM COILING    . BRAIN SURGERY    . TONSILLECTOMY      OB History    Gravida Para Term Preterm AB Living   6 4 4   2 4    SAB TAB Ectopic Multiple Live Births   1 1 0 0 4       Home Medications    Prior to Admission medications   Medication Sig Start Date End Date Taking? Authorizing Provider  cephALEXin (KEFLEX) 500 MG capsule Take 1 capsule (500 mg total) by mouth 4 (four) times daily. 07/02/17   Cathie HoopsYu, Raylyn Carton V, PA-C  ibuprofen (ADVIL,MOTRIN) 600 MG tablet Take 1 tablet (600 mg total) by mouth every 4 (four) hours as needed. 04/10/17   Arlyce HarmanLockamy, Timothy, DO  lamoTRIgine (LAMICTAL) 100 MG tablet Take 200 mg by mouth 2 (two) times daily. 03/11/17   [provider]  naproxen (NAPROSYN) 500 MG tablet Take 1 tablet (500 mg total) by mouth 2 (two) times daily for 10 days. 07/02/17 07/12/17  Cathie HoopsYu, Keishaun Hazel V, PA-C  nicotine (NICODERM CQ - DOSED IN MG/24 HOURS) 21 mg/24hr patch Place 1 patch (21 mg total) onto the skin daily. Patient not taking: Reported on 04/08/2017 01/30/17   Constant, Peggy, MD  ondansetron (ZOFRAN ODT) 4 MG disintegrating tablet Take 1 tablet (4 mg total) by mouth every 6 (six) hours as needed for nausea. Patient not taking: Reported on 03/15/2017 11/01/16   Levie HeritageStinson, Jacob J, DO  pyridOXINE (VITAMIN B-6) 25 MG tablet Take 1 tablet (25 mg total) by mouth 4 (four) times daily as needed (nausea and vomiting). Patient not taking: Reported on 03/15/2017 12/17/16   Levie HeritageStinson, Jacob J, DO    Family History Family History  Problem Relation Age of Onset  . Thyroid disease Mother   . Aortic aneurysm Mother        heart  . Diabetes Mother   . Hypertension Maternal Aunt   . Hypertension Maternal Uncle   . Hypertension Maternal Grandmother   . Diabetes Maternal Grandmother   . Hypertension Sister   . Diabetes Sister   . Asthma Daughter     Social History Social  History   Tobacco Use  . Smoking status: Former Smoker    Packs/day: 0.00    Years: 18.00    Pack years: 0.00    Last attempt to quit: 11/24/2016    Years since quitting: 0.6  . Smokeless tobacco: Never Used  Substance Use Topics  . Alcohol use: No  . Drug use: No     Allergies   Hydroxacen [hydroxyzine] and Vicodin [hydrocodone-acetaminophen]   Review of Systems Review of Systems  Reason unable to perform ROS: See HPI as above.     Physical Exam Triage Vital Signs ED Triage Vitals  Enc Vitals Group     BP 07/02/17 1634 111/76     Pulse Rate 07/02/17 1634 90     Resp 07/02/17 1634 18     Temp 07/02/17 1634 98.5 F (36.9 C)     Temp src --      SpO2 07/02/17 1634 100 %     Weight --      Height --      Head Circumference --      Peak Flow --      Pain  Score 07/02/17 1632 10     Pain Loc --      Pain Edu? --      Excl. in GC? --    No data found.  Updated Vital Signs BP 111/76   Pulse 90   Temp 98.5 F (36.9 C)   Resp 18   LMP 07/02/2017   SpO2 100%   Physical Exam  Constitutional: She is oriented to person, place, and time. She appears well-developed and well-nourished. No distress.  HENT:  Head: Normocephalic and atraumatic.  Eyes: Conjunctivae are normal. Pupils are equal, round, and reactive to light.  Neurological: She is alert and oriented to person, place, and time.  Skin: Skin is warm and dry.  Left axilla with 3cm x 2cm abscess. Surrounding erythema with increased warmth.      UC Treatments / Results  Labs (all labs ordered are listed, but only abnormal results are displayed) Labs Reviewed  AEROBIC CULTURE (SUPERFICIAL SPECIMEN)    EKG  EKG Interpretation None       Radiology No results found.  Procedures Incision and Drainage Date/Time: 07/02/2017 5:15 PM Performed by: Belinda Fisher, PA-C Authorized by: Mardella Layman, MD   Consent:    Consent obtained:  Verbal   Consent given by:  Patient   Risks discussed:  Bleeding, incomplete drainage, infection and pain   Alternatives discussed:  Referral Location:    Type:  Abscess   Size:  2 x 3cm   Location:  Upper extremity   Upper extremity location:  Arm   Arm location:  L upper arm Pre-procedure details:    Skin preparation:  Hibiclens Anesthesia (see MAR for exact dosages):    Anesthesia method:  Local infiltration   Local anesthetic:  Lidocaine 2% WITH epi Procedure type:    Complexity:  Simple Procedure details:    Needle aspiration: no     Incision types:  Single straight   Scalpel blade:  11   Wound management:  Probed and deloculated   Drainage:  Purulent and bloody   Drainage amount:  Copious   Wound treatment:  Wound left open   Packing materials:  None Post-procedure details:    Patient tolerance of procedure:  Tolerated well,  no immediate complications    (including critical care time)  Medications Ordered in UC Medications - No data to  display   Initial Impression / Assessment and Plan / UC Course  I have reviewed the triage vital signs and the nursing notes.  Pertinent labs & imaging results that were available during my care of the patient were reviewed by me and considered in my medical decision making (see chart for details).    Patient tolerated procedure well with no immediate complications.  Start Keflex for surrounding cellulitis.  Naproxen for pain.  Wound care instructions given.  Return precautions given.  Patient expresses understanding and agrees to plan.  Final Clinical Impressions(s) / UC Diagnoses   Final diagnoses:  Abscess of left axilla    ED Discharge Orders        Ordered    cephALEXin (KEFLEX) 500 MG capsule  4 times daily     07/02/17 1712    naproxen (NAPROSYN) 500 MG tablet  2 times daily     07/02/17 1712       Belinda Fisher, PA-C 07/02/17 1717

## 2017-07-04 LAB — AEROBIC CULTURE W GRAM STAIN (SUPERFICIAL SPECIMEN)

## 2017-07-04 LAB — AEROBIC CULTURE  (SUPERFICIAL SPECIMEN)

## 2017-09-18 ENCOUNTER — Emergency Department (HOSPITAL_COMMUNITY)
Admission: EM | Admit: 2017-09-18 | Discharge: 2017-09-18 | Disposition: A | Discharge status: Home / Self Care | Payer: Medicaid Other | Attending: Emergency Medicine | Admitting: Emergency Medicine

## 2017-09-18 ENCOUNTER — Other Ambulatory Visit: Payer: Self-pay

## 2017-09-18 ENCOUNTER — Encounter (HOSPITAL_COMMUNITY): Payer: Self-pay | Admitting: *Deleted

## 2017-09-18 DIAGNOSIS — Z79899 Other long term (current) drug therapy: Secondary | ICD-10-CM | POA: Insufficient documentation

## 2017-09-18 DIAGNOSIS — J029 Acute pharyngitis, unspecified: Secondary | ICD-10-CM | POA: Diagnosis not present

## 2017-09-18 DIAGNOSIS — E039 Hypothyroidism, unspecified: Secondary | ICD-10-CM | POA: Diagnosis not present

## 2017-09-18 DIAGNOSIS — J45909 Unspecified asthma, uncomplicated: Secondary | ICD-10-CM | POA: Insufficient documentation

## 2017-09-18 DIAGNOSIS — Z87891 Personal history of nicotine dependence: Secondary | ICD-10-CM | POA: Insufficient documentation

## 2017-09-18 NOTE — Discharge Instructions (Addendum)

## 2017-09-18 NOTE — ED Notes (Signed)
Pt states she began to have a sore throat and body aches yesterday, as well as a cough. Pt believes she has a sinus infection.

## 2017-09-18 NOTE — ED Provider Notes (Signed)
[facility] Provider [facility]   CSN: [id number] Arrival date & time: 09/18/17  0940     History   Chief Complaint Chief Complaint  Patient presents with  . Sore Throat    HPI Hannah Cook is a 37 y.o. female with a history of asthma, aneurysm s/p coil, who presents today for evaluation of sore throat.  She reports that her children when seen yesterday for similar illness and diagnosed with colds.  She reports that yesterday when her temperature was checked she had a fever of 102, however denies fevers since then and has not had any ibuprofen, tylenol or other antipyretics today.  She denies headache.  Reports cough, nasal congestion, and body aches.  She reports she did get a flu shot. She reports "when I feel like this it is always a sinus infection."    HPI  Past Medical History:  Diagnosis Date  . Aneurysm (HCC) 2002  . Asthma   . Hypothyroidism   . Panic attack   . 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 Thyroid disease     Patient Active Problem List   Diagnosis Date Noted  . Vaginal bleeding in pregnancy, third trimester 04/08/2017  . Tobacco smoking complicating pregnancy 01/30/2017  . History of intracranial aneurysm s/p coiling 11/05/2016  . History of hypothyroidism 11/01/2016  . Advanced maternal age in multigravida 10/26/2016  . Supervision of high risk pregnancy, antepartum 10/21/2014  . Obesity (BMI 30.0-34.9) 02/16/2014  . Seizure disorder in pregnancy, antepartum (HCC) 05/20/2013    Past Surgical History:  Procedure Laterality Date  . ANEURYSM COILING    . BRAIN SURGERY    . TONSILLECTOMY       OB History    Gravida  6   Para  4   Term  4   Preterm      AB  2   Living  4     SAB  1   TAB  1   Ectopic  0   Multiple  0   Live Births  4            Home Medications    Prior to Admission medications   Medication Sig Start Date End Date Taking? Authorizing  Provider  cephALEXin (KEFLEX) 500 MG capsule Take 1 capsule (500 mg total) by mouth 4 (four) times daily. 07/02/17   Cathie HoopsYu, Amy V, PA-C  ibuprofen (ADVIL,MOTRIN) 600 MG tablet Take 1 tablet (600 mg total) by mouth every 4 (four) hours as needed. 04/10/17   Arlyce HarmanLockamy, Timothy, DO  lamoTRIgine (LAMICTAL) 100 MG tablet Take 200 mg by mouth 2 (two) times daily. 03/11/17   [provider]  nicotine (NICODERM CQ - DOSED IN MG/24 HOURS) 21 mg/24hr patch Place 1 patch (21 mg total) onto the skin daily. Patient not taking: Reported on 04/08/2017 01/30/17   Constant, Peggy, MD  ondansetron (ZOFRAN ODT) 4 MG disintegrating tablet Take 1 tablet (4 mg total) by mouth every 6 (six) hours as needed for nausea. Patient not taking: Reported on 03/15/2017 11/01/16   Levie HeritageStinson, Jacob J, DO  pyridOXINE (VITAMIN B-6) 25 MG tablet Take 1 tablet (25 mg total) by mouth 4 (four) times daily as needed (nausea and vomiting). Patient not taking: Reported on 03/15/2017 12/17/16   Levie HeritageStinson, Jacob J, DO  topiramate (TOPAMAX) 25 MG tablet Take 25 mg by mouth 2 (two) times daily.  09/06/11  [provider]  Family History Family History  Problem Relation Age of Onset  . Thyroid disease Mother   . Aortic aneurysm Mother        heart  . Diabetes Mother   . Hypertension Maternal Aunt   . Hypertension Maternal Uncle   . Hypertension Maternal Grandmother   . Diabetes Maternal Grandmother   . Hypertension Sister   . Diabetes Sister   . Asthma Daughter     Social History Social History   Tobacco Use  . Smoking status: Former Smoker    Packs/day: 0.00    Years: 18.00    Pack years: 0.00    Last attempt to quit: 11/24/2016    Years since quitting: 0.8  . Smokeless tobacco: Never Used  Substance Use Topics  . Alcohol use: No  . Drug use: No     Allergies   Hydroxacen [hydroxyzine] and Vicodin [hydrocodone-acetaminophen]   Review of Systems Review of Systems  Constitutional: Positive for fever  (Intermittent). Negative for chills.  HENT: Positive for congestion, ear pain, postnasal drip, rhinorrhea and sore throat. Negative for sinus pressure, sinus pain, trouble swallowing and voice change.   Respiratory: Positive for cough. Negative for chest tightness and shortness of breath.   Cardiovascular: Negative for chest pain and palpitations.  Musculoskeletal: Negative for neck pain and neck stiffness.  Skin: Negative for rash.  Neurological: Negative for headaches.     Physical Exam Updated Vital Signs BP 105/72 (BP Location: Right Arm)   Pulse (!) 104   Temp 98.3 F (36.8 C) (Oral)   Resp 20   SpO2 100%   Physical Exam  Constitutional: She appears well-developed and well-nourished. No distress.  HENT:  Head: Normocephalic and atraumatic.  Right Ear: Tympanic membrane, external ear and ear canal normal. No middle ear effusion.  Left Ear: Tympanic membrane, external ear and ear canal normal.  No middle ear effusion.  Nose: Mucosal edema and rhinorrhea present.  Mouth/Throat: Uvula is midline, oropharynx is clear and moist and mucous membranes are normal. No oropharyngeal exudate, posterior oropharyngeal edema or posterior oropharyngeal erythema. Tonsils are 1+ on the right. Tonsils are 1+ on the left. No tonsillar exudate.  Eyes: Conjunctivae are normal. No scleral icterus.  Neck: Normal range of motion. Neck supple.  Cardiovascular: Normal rate and regular rhythm.  Pulmonary/Chest: Effort normal and breath sounds normal. No respiratory distress. She has no wheezes.  Lymphadenopathy:    She has no cervical adenopathy.  Neurological: She is alert.  Skin: Skin is warm and dry. She is not diaphoretic.  Psychiatric: She has a normal mood and affect. Her behavior is normal.  Nursing note and vitals reviewed.    ED Treatments / Results  Labs (all labs ordered are listed, but only abnormal results are displayed) Labs Reviewed - No data to display  EKG None  Radiology No  results found.  Procedures Procedures (including critical care time)  Medications Ordered in ED Medications - No data to display   Initial Impression / Assessment and Plan / ED Course  I have reviewed the triage vital signs and the nursing notes.  Pertinent labs & imaging results that were available during my care of the patient were reviewed by me and considered in my medical decision making (see chart for details).      Patients symptoms are consistent with URI, likely viral etiology. Discussed that antibiotics are not indicated for viral infections. Pt will be discharged with symptomatic treatment.  We discussed that, while her symptoms are most  likely representative of a URI that many serious illnesses can initially present similar and that she should follow-up with her PCP and monitor herself for worsening symptoms or progression.  She verbalizes understanding and is agreeable with plan. Pt is hemodynamically stable & in NAD prior to dc.   Final Clinical Impressions(s) / ED Diagnoses   Final diagnoses:  Sore throat    ED Discharge Orders    None       Norman Clay 09/18/17 1030    Mabe, Latanya Maudlin, MD 09/18/17 [phone]

## 2017-09-18 NOTE — ED Notes (Signed)
ED Provider at bedside. 

## 2017-09-18 NOTE — ED Triage Notes (Signed)
Pt in c/o sore throat, nasal congestion and intermittent fever, no distress noted

## 2017-10-23 ENCOUNTER — Encounter (HOSPITAL_COMMUNITY): Payer: Self-pay | Admitting: Emergency Medicine

## 2017-10-23 ENCOUNTER — Other Ambulatory Visit: Payer: Self-pay

## 2017-10-23 ENCOUNTER — Emergency Department (HOSPITAL_COMMUNITY)
Admission: EM | Admit: 2017-10-23 | Discharge: 2017-10-23 | Disposition: A | Discharge status: Home / Self Care | Payer: Medicaid Other | Attending: Emergency Medicine | Admitting: Emergency Medicine

## 2017-10-23 DIAGNOSIS — J45909 Unspecified asthma, uncomplicated: Secondary | ICD-10-CM | POA: Insufficient documentation

## 2017-10-23 DIAGNOSIS — E039 Hypothyroidism, unspecified: Secondary | ICD-10-CM | POA: Insufficient documentation

## 2017-10-23 DIAGNOSIS — Z79899 Other long term (current) drug therapy: Secondary | ICD-10-CM | POA: Insufficient documentation

## 2017-10-23 DIAGNOSIS — Z87891 Personal history of nicotine dependence: Secondary | ICD-10-CM | POA: Insufficient documentation

## 2017-10-23 DIAGNOSIS — H5711 Ocular pain, right eye: Secondary | ICD-10-CM | POA: Diagnosis present

## 2017-10-23 DIAGNOSIS — H00022 Hordeolum internum right lower eyelid: Secondary | ICD-10-CM | POA: Insufficient documentation

## 2017-10-23 NOTE — Discharge Instructions (Addendum)
Please read attached information. If you experience any new or worsening signs or symptoms please return to the emergency room for evaluation. Please follow-up with your primary care provider or specialist as discussed.  °

## 2017-10-23 NOTE — ED Triage Notes (Signed)
Pain in right eye, she has swelling around it with a redness in the lower eyelid.  No discharge from the eye.

## 2017-10-23 NOTE — ED Provider Notes (Signed)
[facility] EMERGENCY DEPARTMENT Provider Note   CSN: [id number] Arrival date & time: 10/23/17  2141     History   Chief Complaint Chief Complaint  Patient presents with  . Eye Pain    HPI Hannah Cook is a 37 y.o. female.  HPI   37 year old female presents today with complaints of eye pain.  Patient reports 13-month history of a hard nodule to the right lower eyelid.  She denies any associated swelling or edema to the surrounding soft tissues, denies any redness, discharge, pain in the globe, denies any loss of ocular movements or vision.  No history of the same.  Past Medical History:  Diagnosis Date  . Aneurysm (HCC) 2002  . Asthma   . Hypothyroidism   . Panic attack   . 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 Thyroid disease     Patient Active Problem List   Diagnosis Date Noted  . Vaginal bleeding in pregnancy, third trimester 04/08/2017  . Tobacco smoking complicating pregnancy 01/30/2017  . History of intracranial aneurysm s/p coiling 11/05/2016  . History of hypothyroidism 11/01/2016  . Advanced maternal age in multigravida 10/26/2016  . Supervision of high risk pregnancy, antepartum 10/21/2014  . Obesity (BMI 30.0-34.9) 02/16/2014  . Seizure disorder in pregnancy, antepartum (HCC) 05/20/2013    Past Surgical History:  Procedure Laterality Date  . ANEURYSM COILING    . BRAIN SURGERY    . TONSILLECTOMY       OB History    Gravida  6   Para  4   Term  4   Preterm      AB  2   Living  4     SAB  1   TAB  1   Ectopic  0   Multiple  0   Live Births  4            Home Medications    Prior to Admission medications   Medication Sig Start Date End Date Taking? Authorizing Provider  cephALEXin (KEFLEX) 500 MG capsule Take 1 capsule (500 mg total) by mouth 4 (four) times daily. 07/02/17   Cathie Hoops, Amy V, PA-C  ibuprofen (ADVIL,MOTRIN) 600 MG tablet Take 1 tablet (600 mg total) by mouth every 4  (four) hours as needed. 04/10/17   Arlyce Harman, DO  lamoTRIgine (LAMICTAL) 100 MG tablet Take 200 mg by mouth 2 (two) times daily. 03/11/17   [provider]  nicotine (NICODERM CQ - DOSED IN MG/24 HOURS) 21 mg/24hr patch Place 1 patch (21 mg total) onto the skin daily. Patient not taking: Reported on 04/08/2017 01/30/17   Constant, Peggy, MD  ondansetron (ZOFRAN ODT) 4 MG disintegrating tablet Take 1 tablet (4 mg total) by mouth every 6 (six) hours as needed for nausea. Patient not taking: Reported on 03/15/2017 11/01/16   Levie Heritage, DO  pyridOXINE (VITAMIN B-6) 25 MG tablet Take 1 tablet (25 mg total) by mouth 4 (four) times daily as needed (nausea and vomiting). Patient not taking: Reported on 03/15/2017 12/17/16   Levie Heritage, DO  topiramate (TOPAMAX) 25 MG tablet Take 25 mg by mouth 2 (two) times daily.  09/06/11  [provider]    Family History Family History  Problem Relation Age of Onset  . Thyroid disease Mother   . Aortic aneurysm Mother        heart  . Diabetes Mother   . Hypertension Maternal Aunt   .  Hypertension Maternal Uncle   . Hypertension Maternal Grandmother   . Diabetes Maternal Grandmother   . Hypertension Sister   . Diabetes Sister   . Asthma Daughter     Social History Social History   Tobacco Use  . Smoking status: Former Smoker    Packs/day: 0.00    Years: 18.00    Pack years: 0.00    Last attempt to quit: 11/24/2016    Years since quitting: 0.9  . Smokeless tobacco: Never Used  Substance Use Topics  . Alcohol use: No  . Drug use: No     Allergies   Hydroxacen [hydroxyzine] and Vicodin [hydrocodone-acetaminophen]   Review of Systems Review of Systems  All other systems reviewed and are negative.    Physical Exam Updated Vital Signs BP 110/78 (BP Location: Right Arm)   Pulse 84   Temp 98.4 F (36.9 C) (Oral)   Resp 14   SpO2 100%   Physical Exam  Constitutional: She is oriented to person, place, and  time. She appears well-developed and well-nourished.  HENT:  Head: Normocephalic and atraumatic.  40 only him to the right lower eyelid on the lateral aspect, no discharge, no redness, no fluctuance-globe normal with intact ocular movements, vision normal  Eyes: Pupils are equal, round, and reactive to light. Conjunctivae are normal. Right eye exhibits no discharge. Left eye exhibits no discharge. No scleral icterus.  Neck: Normal range of motion. No JVD present. No tracheal deviation present.  Pulmonary/Chest: Effort normal. No stridor.  Neurological: She is alert and oriented to person, place, and time. Coordination normal.  Psychiatric: She has a normal mood and affect. Her behavior is normal. Judgment and thought content normal.  Nursing note and vitals reviewed.    ED Treatments / Results  Labs (all labs ordered are listed, but only abnormal results are displayed) Labs Reviewed - No data to display  EKG None  Radiology No results found.  Procedures Procedures (including critical care time)  Medications Ordered in ED Medications - No data to display   Initial Impression / Assessment and Plan / ED Course  I have reviewed the triage vital signs and the nursing notes.  Pertinent labs & imaging results that were available during my care of the patient were reviewed by me and considered in my medical decision making (see chart for details).     Patient presents with stye to her right eye.  No comp gating features here today.  I have encouraged her to follow-up with ophthalmology for reassessment and ongoing management as this is reportedly getting larger.,  Patient given strict return precautions, she verbalized understanding and agreement to today's plan.  Patient encouraged to use warm compress.  Final Clinical Impressions(s) / ED Diagnoses   Final diagnoses:  Hordeolum internum of right lower eyelid    ED Discharge Orders    None       Rosalio Loud 10/24/17 1431    Gerhard Munch, MD 10/24/17 2017

## 2017-12-23 ENCOUNTER — Ambulatory Visit (HOSPITAL_COMMUNITY)
Admission: EM | Admit: 2017-12-23 | Discharge: 2017-12-23 | Disposition: A | Discharge status: Home / Self Care | Payer: Medicaid Other | Attending: Family Medicine | Admitting: Family Medicine

## 2017-12-23 ENCOUNTER — Other Ambulatory Visit: Payer: Self-pay

## 2017-12-23 ENCOUNTER — Encounter (HOSPITAL_COMMUNITY): Payer: Self-pay | Admitting: *Deleted

## 2017-12-23 DIAGNOSIS — M545 Low back pain: Secondary | ICD-10-CM

## 2017-12-23 DIAGNOSIS — H00012 Hordeolum externum right lower eyelid: Secondary | ICD-10-CM

## 2017-12-23 DIAGNOSIS — M62830 Muscle spasm of back: Secondary | ICD-10-CM

## 2017-12-23 MED ORDER — KETOROLAC TROMETHAMINE 60 MG/2ML IM SOLN: 60.0000 mg | Freq: Once | INTRAMUSCULAR | Status: DC

## 2017-12-23 MED ORDER — KETOROLAC TROMETHAMINE 60 MG/2ML IM SOLN
INTRAMUSCULAR | Status: AC
  Filled 2017-12-23: qty 2

## 2017-12-23 MED ORDER — METHOCARBAMOL 500 MG PO TABS: 500.0000 mg | ORAL_TABLET | Freq: Three times a day (TID) | ORAL | 0 refills | Status: DC

## 2017-12-23 MED ORDER — CEPHALEXIN 500 MG PO CAPS: 500.0000 mg | ORAL_CAPSULE | Freq: Four times a day (QID) | ORAL | 0 refills | Status: AC

## 2017-12-23 MED ORDER — NAPROXEN 500 MG PO TABS: 500.0000 mg | ORAL_TABLET | Freq: Two times a day (BID) | ORAL | 0 refills | Status: DC
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
## 2017-12-23 NOTE — Discharge Instructions (Signed)
Please use Naprosyn twice daily Robaxin as needed Alternate ice and heat Return if symptoms worsening, develop numbness or tingling, difficulty breathing or pain not improving as expected over the next week to 2 weeks.  Stye  -Warm compresses multiple times a day with massage afterward  - Do not wear contacts or make up  - Keep lid clean, may use baby shampoo  - Keflex  - Return if increasing, redness, pain or swelling, decreased vision.
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
## 2018-02-11 ENCOUNTER — Ambulatory Visit (HOSPITAL_COMMUNITY)
Admission: EM | Admit: 2018-02-11 | Discharge: 2018-02-11 | Disposition: A | Discharge status: Home / Self Care | Payer: Medicaid Other | Attending: Family Medicine | Admitting: Family Medicine

## 2018-02-11 ENCOUNTER — Encounter (HOSPITAL_COMMUNITY): Payer: Self-pay | Admitting: Emergency Medicine

## 2018-02-11 DIAGNOSIS — M546 Pain in thoracic spine: Secondary | ICD-10-CM

## 2018-02-11 MED ORDER — MELOXICAM 7.5 MG PO TABS: 7.5000 mg | ORAL_TABLET | Freq: Every day | ORAL | 0 refills | Status: DC

## 2018-02-11 MED ORDER — CYCLOBENZAPRINE HCL 5 MG PO TABS: 5.0000 mg | ORAL_TABLET | Freq: Two times a day (BID) | ORAL | 0 refills | Status: DC | PRN

## 2018-02-11 MED ORDER — KETOROLAC TROMETHAMINE 30 MG/ML IJ SOLN
INTRAMUSCULAR | Status: AC
  Filled 2018-02-11: qty 1

## 2018-02-11 MED ORDER — KETOROLAC TROMETHAMINE 30 MG/ML IJ SOLN
30.0000 mg | Freq: Once | INTRAMUSCULAR | Status: AC
  Administered 2018-02-11: 30 mg via INTRAMUSCULAR

## 2018-02-11 NOTE — ED Provider Notes (Addendum)
[facility]    CSN: [id number] Arrival date & time: 02/11/18  1248     History   Chief Complaint Chief Complaint  Patient presents with  . Back Pain    HPI Hannah Cook is a 37 y.o. female.   37 year old female comes in for 1 day history of right thoracic back pain. 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 Denies obvious injury/trauma pain worse with movement, and also gets spasms. Has baseline numbness/tingling of her finger due to her aneurysm. Denies worsening symptoms. Denies saddle anesthesia, loss of bladder or bowel control. Has not taken anything for the symptoms.      Past Medical History:  Diagnosis Date  . Aneurysm (HCC) 2002  . Asthma   . Hypothyroidism   . Panic attack   . 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 Thyroid disease     Patient Active Problem List   Diagnosis Date Noted  . Vaginal bleeding in pregnancy, third trimester 04/08/2017  . Tobacco smoking complicating pregnancy 01/30/2017  . History of intracranial aneurysm s/p coiling 11/05/2016  . History of hypothyroidism 11/01/2016  . Advanced maternal age in multigravida 10/26/2016  . Supervision of high risk pregnancy, antepartum 10/21/2014  . Obesity (BMI 30.0-34.9) 02/16/2014  . Seizure disorder in pregnancy, antepartum (HCC) 05/20/2013    Past Surgical History:  Procedure Laterality Date  . ANEURYSM COILING    . BRAIN SURGERY    . TONSILLECTOMY      OB History    Gravida  6   Para  4   Term  4   Preterm      AB  2   Living  4     SAB  1   TAB  1   Ectopic  0   Multiple  0   Live Births  4            Home Medications    Prior to Admission medications   Medication Sig Start Date End Date Taking? Authorizing Provider  cyclobenzaprine (FLEXERIL) 5 MG tablet Take 1 tablet (5 mg total) by mouth 2 (two) times daily as needed for muscle spasms. 02/11/18   Cathie Hoops, Kyle Luppino V, PA-C  lamoTRIgine (LAMICTAL) 100  MG tablet Take 200 mg by mouth 2 (two) times daily. 03/11/17   [provider]  meloxicam (MOBIC) 7.5 MG tablet Take 1 tablet (7.5 mg total) by mouth daily. 02/11/18   Cathie Hoops, Estes Lehner V, PA-C  topiramate (TOPAMAX) 25 MG tablet Take 25 mg by mouth 2 (two) times daily.  09/06/11  [provider]    Family History Family History  Problem Relation Age of Onset  . Thyroid disease Mother   . Aortic aneurysm Mother        heart  . Diabetes Mother   . Hypertension Maternal Aunt   . Hypertension Maternal Uncle   . Hypertension Maternal Grandmother   . Diabetes Maternal Grandmother   . Hypertension Sister   . Diabetes Sister   . Asthma Daughter     Social History Social History   Tobacco Use  . Smoking status: Former Smoker    Packs/day: 0.00    Years: 18.00    Pack years: 0.00    Last attempt to quit: 11/24/2016    Years since quitting: 1.2  . Smokeless tobacco: Never Used  Substance Use Topics  . Alcohol use: No  . Drug use: No  Allergies   Hydroxacen [hydroxyzine] and Vicodin [hydrocodone-acetaminophen]   Review of Systems Review of Systems  Reason unable to perform ROS: See HPI as above.     Physical Exam Triage Vital Signs ED Triage Vitals [02/11/18 1317]  Enc Vitals Group     BP 117/60     Pulse Rate 91     Resp 18     Temp 98.2 F (36.8 C)     Temp Source Oral     SpO2 100 %     Weight      Height      Head Circumference      Peak Flow      Pain Score 8     Pain Loc      Pain Edu?      Excl. in GC?    No data found.  Updated Vital Signs BP 117/60 (BP Location: Left Arm)   Pulse 91   Temp 98.2 F (36.8 C) (Oral)   Resp 18   SpO2 100%   Physical Exam  Constitutional: She is oriented to person, place, and time. She appears well-developed and well-nourished. No distress.  HENT:  Head: Normocephalic and atraumatic.  Eyes: Pupils are equal, round, and reactive to light. Conjunctivae are normal.  Neck: Normal range of motion. Neck  supple.  Cardiovascular: Normal rate, regular rhythm and normal heart sounds. Exam reveals no gallop and no friction rub.  No murmur heard. Pulmonary/Chest: Effort normal and breath sounds normal. No accessory muscle usage or stridor. No respiratory distress. She has no decreased breath sounds. She has no wheezes. She has no rhonchi. She has no rales.  Musculoskeletal:  No tenderness on palpation of the spinous processes. Tenderness to palpation of left thoracic region. Full ROM of shoulder, back, neck. Strength normal and equal bilaterally. Sensation intact and equal bilaterally.  Radial pulses 2+ and equal bilaterally. Capillary refill less than 2 seconds.   Neurological: She is alert and oriented to person, place, and time.  Skin: Skin is warm and dry. She is not diaphoretic.     UC Treatments / Results  Labs (all labs ordered are listed, but only abnormal results are displayed) Labs Reviewed - No data to display  EKG None  Radiology No results found.  Procedures Procedures (including critical care time)  Medications Ordered in UC Medications  ketorolac (TORADOL) 30 MG/ML injection 30 mg (30 mg Intramuscular Given 02/11/18 1352)    Initial Impression / Assessment and Plan / UC Course  I have reviewed the triage vital signs and the nursing notes.  Pertinent labs & imaging results that were available during my care of the patient were reviewed by me and considered in my medical decision making (see chart for details).    Toradol injection in office today. Start NSAID as directed for pain and inflammation. Muscle relaxant as needed. Ice/heat compresses. Discussed with patient strain can take up to 3-4 weeks to resolve, but should be getting better each week. Return precautions given.   Final Clinical Impressions(s) / UC Diagnoses   Final diagnoses:  Acute left-sided thoracic back pain    ED Prescriptions    Medication Sig Dispense Auth. Provider   meloxicam (MOBIC) 7.5  MG tablet Take 1 tablet (7.5 mg total) by mouth daily. 15 tablet Leelynn Whetsel V, PA-C   cyclobenzaprine (FLEXERIL) 5 MG tablet Take 1 tablet (5 mg total) by mouth 2 (two) times daily as needed for muscle spasms. 20 tablet Belinda FisherYu, Jaidy Cottam V, PA-C  Belinda Fisher, PA-C 02/11/18 1357    Belinda Fisher, PA-C 02/11/18 1358

## 2018-02-11 NOTE — ED Triage Notes (Signed)
Pt sts mid to upper back pain with hx of same

## 2018-02-11 NOTE — Discharge Instructions (Signed)
Start Mobic. Do not take ibuprofen (motrin/advil)/ naproxen (aleve) while on mobic.  Flexeril twice a day as needed.; Flexeril can make you drowsy, so do not take if you are going to drive, operate heavy machinery, or make important decisions. Ice/heat compresses as needed. This can take up to 3-4 weeks to completely resolve, but you should be feeling better each week. Follow up here or with PCP if symptoms worsen, changes for reevaluation. If experience numbness/tingling of the inner thighs, loss of bladder or bowel control, go to the emergency department for evaluation.

## 2018-05-10 ENCOUNTER — Encounter (HOSPITAL_COMMUNITY): Payer: Self-pay | Admitting: Emergency Medicine

## 2018-05-10 ENCOUNTER — Ambulatory Visit (HOSPITAL_COMMUNITY)
Admission: EM | Admit: 2018-05-10 | Discharge: 2018-05-10 | Disposition: A | Discharge status: Home / Self Care | Payer: Medicaid Other | Attending: Family Medicine | Admitting: Family Medicine

## 2018-05-10 DIAGNOSIS — L02412 Cutaneous abscess of left axilla: Secondary | ICD-10-CM | POA: Diagnosis not present

## 2018-05-10 DIAGNOSIS — K649 Unspecified hemorrhoids: Secondary | ICD-10-CM

## 2018-05-10 MED ORDER — SULFAMETHOXAZOLE-TRIMETHOPRIM 800-160 MG PO TABS: 1.0000 | ORAL_TABLET | Freq: Two times a day (BID) | ORAL | 0 refills | Status: AC

## 2018-05-10 MED ORDER — PRAMOXINE HCL 1 % RE FOAM: 1.0000 "application " | Freq: Three times a day (TID) | RECTAL | 0 refills | Status: DC | PRN

## 2018-05-10 NOTE — Discharge Instructions (Signed)
You may use over the counter ibuprofen or acetaminophen as needed.  ° °

## 2018-05-10 NOTE — ED Triage Notes (Signed)
Pt sts abscess under left arm and possibly to buttocks area; pt sts hx of same in past

## 2018-05-12 NOTE — ED Provider Notes (Signed)
[facility]   [id number] 05/10/18 Arrival Time: 1645  ASSESSMENT & PLAN:  1. Abscess of left axilla   2. Acute hemorrhoid    Incision and Drainage Procedure Note: L Axilla  Anesthesia: 2% plain lidocaine  Procedure Details  The procedure, risks and complications have been discussed in detail (including, but not limited to pain and bleeding) with the patient.  The skin induration was prepped and draped in the usual fashion. After adequate local anesthesia, I&D with a #11 blade was performed on the left axilla. Purulent drainage: present.  EBL: minimal  Drains: packing placed  Condition: Tolerated procedure well  Complications: none.  Meds ordered this encounter  Medications  . sulfamethoxazole-trimethoprim (BACTRIM DS,SEPTRA DS) 800-160 MG tablet    Sig: Take 1 tablet by mouth 2 (two) times daily for 10 days.    Dispense:  20 tablet    Refill:  0  . pramoxine (PROCTOFOAM) 1 % foam    Sig: Place 1 application rectally 3 (three) times daily as needed for anal itching.    Dispense:  15 g    Refill:  0   Hemorrhoid non-thrombosed. Should resolve over the next few days.  Wound care instructions discussed and given in written format. To return in 48 hours for wound check.  Finish all antibiotics. OTC analgesics as needed.  Reviewed expectations re: course of current medical issues. Questions answered. Outlined signs and symptoms indicating need for more acute intervention. Patient verbalized understanding. After Visit Summary given.   SUBJECTIVE:  Hannah Cook is a 37 y.o. female who presents with a possible abscess of her L axilla. Onset gradual, approximately several days ago. Afebrile. Now more painful. H/O these in the past. No home treatment. No specific aggravating or alleviating factors reported.  Also thinks she has a hemorrhoid. Had them during pregnancy. Sits on the toilet for long periods of time looking at her phone when having a bowel  movement. Occasional straining and hard stools. No rectal bleeding reported. No abdominal pain/n/v. No OTC treatment. Worse with prolonged standing/walking.  ROS: As per HPI.  OBJECTIVE:  Vitals:   05/10/18 1720  BP: 117/62  Pulse: 96  Resp: 18  Temp: 98 F (36.7 C)  TempSrc: Oral  SpO2: 100%     General appearance: alert; no distress Skin: 1.5 cm induration of her L axilla; tender to touch; no active drainage or bleeding; LUE with FROM Abd: soft; non-tender Rectal: non-thrombosed hemorrhoid; approx 0.5 cm in size; no bleeding; tender to touch; no rectal masses felt Psychological: alert and cooperative; normal mood and affect  Allergies  Allergen Reactions  . Hydroxacen [Hydroxyzine] Itching and Nausea And Vomiting  . Vicodin [Hydrocodone-Acetaminophen] Itching, Nausea And Vomiting and Other (See Comments)    headaches    Past Medical History:  Diagnosis Date  . Aneurysm (HCC) 2002  . Asthma   . Hypothyroidism   . Panic attack   . 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 Thyroid disease    Social History   Socioeconomic History  . Marital status: Single    Spouse name: Not on file  . Number of children: Not on file  . Years of education: Not on file  . Highest education level: Not on file  Occupational History  . Not on file  Social Needs  . Financial resource strain: Not on file  . Food insecurity:    Worry: Not on file    Inability: Not on  file  . Transportation needs:    Medical: Not on file    Non-medical: Not on file  Tobacco Use  . Smoking status: Former Smoker    Packs/day: 0.00    Years: 18.00    Pack years: 0.00    Last attempt to quit: 11/24/2016    Years since quitting: 1.4  . Smokeless tobacco: Never Used  Substance and Sexual Activity  . Alcohol use: No  . Drug use: No  . Sexual activity: Yes    Birth control/protection: Condom    Comment: With men only  Lifestyle  . Physical activity:    Days per week: Not  on file    Minutes per session: Not on file  . Stress: Not on file  Relationships  . Social connections:    Talks on phone: Not on file    Gets together: Not on file    Attends religious service: Not on file    Active member of club or organization: Not on file    Attends meetings of clubs or organizations: Not on file    Relationship status: Not on file  Other Topics Concern  . Not on file  Social History Narrative  . Not on file   Family History  Problem Relation Age of Onset  . Thyroid disease Mother   . Aortic aneurysm Mother        heart  . Diabetes Mother   . Hypertension Maternal Aunt   . Hypertension Maternal Uncle   . Hypertension Maternal Grandmother   . Diabetes Maternal Grandmother   . Hypertension Sister   . Diabetes Sister   . Asthma Daughter    Past Surgical History:  Procedure Laterality Date  . ANEURYSM COILING    . BRAIN SURGERY    . Greig Right, MD 05/12/18 [id number]

## 2018-12-12 ENCOUNTER — Emergency Department (HOSPITAL_COMMUNITY)
Admission: EM | Admit: 2018-12-12 | Discharge: 2018-12-13 | Disposition: A | Discharge status: Home / Self Care | Payer: Medicaid Other | Attending: Emergency Medicine | Admitting: Emergency Medicine

## 2018-12-12 ENCOUNTER — Other Ambulatory Visit: Payer: Self-pay

## 2018-12-12 ENCOUNTER — Encounter (HOSPITAL_COMMUNITY): Payer: Self-pay | Admitting: Emergency Medicine

## 2018-12-12 DIAGNOSIS — F419 Anxiety disorder, unspecified: Secondary | ICD-10-CM | POA: Insufficient documentation

## 2018-12-12 DIAGNOSIS — Z5321 Procedure and treatment not carried out due to patient leaving prior to being seen by health care provider: Secondary | ICD-10-CM | POA: Diagnosis not present
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
## 2018-12-13 ENCOUNTER — Other Ambulatory Visit: Payer: Self-pay

## 2018-12-13 NOTE — ED Notes (Signed)
Pt not in room.  Charge RN called pt's number and she said she felt better and decided to leave.  States she believes she was having an anxiety attack.  Encouraged pt to return if feeling bad.

## 2019-04-15 ENCOUNTER — Other Ambulatory Visit: Payer: Self-pay

## 2019-04-15 ENCOUNTER — Inpatient Hospital Stay (HOSPITAL_COMMUNITY)
Admission: AD | Admit: 2019-04-15 | Discharge: 2019-04-15 | Disposition: A | Discharge status: Home / Self Care | Payer: Medicaid Other | Attending: Obstetrics and Gynecology | Admitting: Obstetrics and Gynecology

## 2019-04-15 DIAGNOSIS — Z7689 Persons encountering health services in other specified circumstances: Secondary | ICD-10-CM | POA: Diagnosis present

## 2019-04-15 NOTE — MAU Note (Signed)
Thinks she is having a baby.   +HPT, several of them..  Denies any problems. No bleeding no pain.

## 2019-04-15 NOTE — MAU Provider Note (Signed)
Patient Hannah Cook 38 y.o. [id number] Here for confirmation of pregnancy and [place]. States that she has no signs of miscarriage today but wants to get checked out because she has had two miscarriages in the past; she had no signs with those other miscarriages. She would like an [place] to find out how far along she is. She has had several positive pregnancy tests at home.    I explained to her that we do not offer pregnancy test per request unless there is a concerning reason to do so. I recommended that she start prenatal care and that her provider will order an [place]. Patient stated "What did we come here for" and left with her partner.   Mervyn Skeeters Kooistra 04/15/2019, 3:42 PM

## 2019-05-10 ENCOUNTER — Other Ambulatory Visit: Payer: Self-pay

## 2019-05-10 ENCOUNTER — Inpatient Hospital Stay (HOSPITAL_COMMUNITY)
Admission: AD | Admit: 2019-05-10 | Discharge: 2019-05-10 | Disposition: A | Discharge status: Home / Self Care | Payer: Medicaid Other | Attending: Family Medicine | Admitting: Family Medicine

## 2019-05-10 ENCOUNTER — Inpatient Hospital Stay (HOSPITAL_COMMUNITY): Payer: Medicaid Other

## 2019-05-10 ENCOUNTER — Encounter (HOSPITAL_COMMUNITY): Payer: Self-pay

## 2019-05-10 DIAGNOSIS — R569 Unspecified convulsions: Secondary | ICD-10-CM | POA: Diagnosis not present

## 2019-05-10 DIAGNOSIS — Z79899 Other long term (current) drug therapy: Secondary | ICD-10-CM | POA: Diagnosis not present

## 2019-05-10 DIAGNOSIS — O99331 Smoking (tobacco) complicating pregnancy, first trimester: Secondary | ICD-10-CM | POA: Diagnosis not present

## 2019-05-10 DIAGNOSIS — Z8249 Family history of ischemic heart disease and other diseases of the circulatory system: Secondary | ICD-10-CM | POA: Diagnosis not present

## 2019-05-10 DIAGNOSIS — O468X1 Other antepartum hemorrhage, first trimester: Secondary | ICD-10-CM

## 2019-05-10 DIAGNOSIS — Z8349 Family history of other endocrine, nutritional and metabolic diseases: Secondary | ICD-10-CM | POA: Diagnosis not present

## 2019-05-10 DIAGNOSIS — F17210 Nicotine dependence, cigarettes, uncomplicated: Secondary | ICD-10-CM | POA: Insufficient documentation

## 2019-05-10 DIAGNOSIS — O208 Other hemorrhage in early pregnancy: Secondary | ICD-10-CM | POA: Diagnosis not present

## 2019-05-10 DIAGNOSIS — Z885 Allergy status to narcotic agent status: Secondary | ICD-10-CM | POA: Insufficient documentation

## 2019-05-10 DIAGNOSIS — O418X10 Other specified disorders of amniotic fluid and membranes, first trimester, not applicable or unspecified: Secondary | ICD-10-CM

## 2019-05-10 DIAGNOSIS — O26891 Other specified pregnancy related conditions, first trimester: Secondary | ICD-10-CM | POA: Diagnosis not present

## 2019-05-10 DIAGNOSIS — N939 Abnormal uterine and vaginal bleeding, unspecified: Secondary | ICD-10-CM | POA: Diagnosis present

## 2019-05-10 DIAGNOSIS — Z888 Allergy status to other drugs, medicaments and biological substances status: Secondary | ICD-10-CM | POA: Insufficient documentation

## 2019-05-10 DIAGNOSIS — Z3490 Encounter for supervision of normal pregnancy, unspecified, unspecified trimester: Secondary | ICD-10-CM

## 2019-05-10 DIAGNOSIS — Z833 Family history of diabetes mellitus: Secondary | ICD-10-CM | POA: Diagnosis not present

## 2019-05-10 DIAGNOSIS — Z825 Family history of asthma and other chronic lower respiratory diseases: Secondary | ICD-10-CM | POA: Diagnosis not present

## 2019-05-10 DIAGNOSIS — O209 Hemorrhage in early pregnancy, unspecified: Secondary | ICD-10-CM

## 2019-05-10 DIAGNOSIS — R102 Pelvic and perineal pain: Secondary | ICD-10-CM

## 2019-05-10 DIAGNOSIS — R109 Unspecified abdominal pain: Secondary | ICD-10-CM | POA: Diagnosis not present

## 2019-05-10 DIAGNOSIS — O09521 Supervision of elderly multigravida, first trimester: Secondary | ICD-10-CM | POA: Diagnosis not present

## 2019-05-10 DIAGNOSIS — Z3A09 9 weeks gestation of pregnancy: Secondary | ICD-10-CM | POA: Insufficient documentation

## 2019-05-10 DIAGNOSIS — O26899 Other specified pregnancy related conditions, unspecified trimester: Secondary | ICD-10-CM

## 2019-05-10 LAB — CBC
HCT: 36 % (ref 36.0–46.0)
Hemoglobin: 12.7 g/dL (ref 12.0–15.0)
MCH: 33.2 pg (ref 26.0–34.0)
MCHC: 35.3 g/dL (ref 30.0–36.0)
MCV: 94 fL (ref 80.0–100.0)
Platelets: 286 10*3/uL (ref 150–400)
RBC: 3.83 MIL/uL — ABNORMAL LOW (ref 3.87–5.11)
RDW: 12.4 % (ref 11.5–15.5)
WBC: 5.4 10*3/uL (ref 4.0–10.5)
nRBC: 0 % (ref 0.0–0.2)

## 2019-05-10 LAB — COMPREHENSIVE METABOLIC PANEL
ALT: 13 U/L (ref 0–44)
AST: 18 U/L (ref 15–41)
Albumin: 3.3 g/dL — ABNORMAL LOW (ref 3.5–5.0)
Alkaline Phosphatase: 53 U/L (ref 38–126)
Anion gap: 7 (ref 5–15)
BUN: 8 mg/dL (ref 6–20)
CO2: 21 mmol/L — ABNORMAL LOW (ref 22–32)
Calcium: 8.8 mg/dL — ABNORMAL LOW (ref 8.9–10.3)
Chloride: 107 mmol/L (ref 98–111)
Creatinine, Ser: 0.65 mg/dL (ref 0.44–1.00)
GFR calc Af Amer: >60 mL/min (ref >60)
GFR calc non Af Amer: >60 mL/min (ref >60)
Glucose, Bld: 95 mg/dL (ref 70–99)
Potassium: 3.7 mmol/L (ref 3.5–5.1)
Sodium: 135 mmol/L (ref 135–145)
Total Bilirubin: 0.5 mg/dL (ref 0.3–1.2)
Total Protein: 6.4 g/dL — ABNORMAL LOW (ref 6.5–8.1)

## 2019-05-10 LAB — WET PREP, GENITAL
Sperm: NONE SEEN
Trich, Wet Prep: NONE SEEN
WBC, Wet Prep HPF POC: NONE SEEN
Yeast Wet Prep HPF POC: NONE SEEN

## 2019-05-10 LAB — HCG, QUANTITATIVE, PREGNANCY: hCG, Beta Chain, Quant, S: 94665 m[IU]/mL — ABNORMAL HIGH (ref <5)

## 2019-05-10 LAB — POCT PREGNANCY, URINE: Preg Test, Ur: POSITIVE — AB

## 2019-05-10 LAB — URINALYSIS, ROUTINE W REFLEX MICROSCOPIC
Bilirubin Urine: NEGATIVE
Glucose, UA: NEGATIVE mg/dL
Hgb urine dipstick: NEGATIVE
Ketones, ur: NEGATIVE mg/dL
Leukocytes,Ua: NEGATIVE
Nitrite: NEGATIVE
Protein, ur: NEGATIVE mg/dL
Specific Gravity, Urine: 1.021 (ref 1.005–1.030)
pH: 6 (ref 5.0–8.0)

## 2019-05-10 NOTE — MAU Note (Signed)
Hannah Cook is a 38 y.o. at 100w5d here in MAU reporting: cramping and some bleeding. Cramping started 2-3 days ago and is intermittent. Had some pain this morning but none currently. Had some bleeding 2 days ago but none since then. + UPT at home the beginning of last month.   LMP:  03/08/19 (states periods are irregular and is unsure of exact LMP)  Onset of complaint: ongoing  Pain score: 0/10  Vitals:   05/10/19 1439  BP: (!) 104/55  Pulse: 87  Resp: 18  Temp: 98 F (36.7 C)  SpO2: 100%     Lab orders placed from triage: UA, UPT

## 2019-05-10 NOTE — MAU Provider Note (Signed)
History     CSN: [id number]  Arrival date and time: 05/10/19 1359   First Provider Initiated Contact with Patient 05/10/19 1448      Chief Complaint  Patient presents with  . Abdominal Pain   Ms. Hannah Cook is a 38 y.o. [id number] at [redacted]w[redacted]d who presents to MAU for vaginal bleeding which began 2 days ago. Pt describes bleeding as "first heavy, then spotting." Pt reports bleeding was two days ago and has not had any since.  Passing blood clots? no Blood soaking clothes? no Lightheaded/dizzy? no Significant pelvic pain or cramping? Menstrual-like cramping, not currently present, last occurred this morning Passed any tissue? no Hx ectopic pregnancy? no Hx of PID, GYN surgery? no  Current pregnancy problems? pt has not yet been seen Blood Type? O positive Allergies? Vicodin, hydroxyzine Current medications? lamictal (aneurysm) Current [facility] & next appt? [facility], nurse intake 05/13/2019  Pt denies vaginal discharge/odor/itching. Pt denies N/V, abdominal pain, constipation, diarrhea, or urinary problems. Pt denies fever, chills, fatigue, sweating or changes in appetite. Pt denies SOB or chest pain. Pt denies dizziness, HA, light-headedness, weakness.   OB History    Gravida  7   Para  4   Term  4   Preterm      AB  2   Living  4     SAB  1   TAB  1   Ectopic  0   Multiple  0   Live Births  4           Past Medical History:  Diagnosis Date  . Aneurysm ([place]) 2002  . Asthma   . Hypothyroidism   . Panic attack   . Seizures ([place])    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  . Thyroid disease     Past Surgical History:  Procedure Laterality Date  . ANEURYSM COILING    . BRAIN SURGERY    . TONSILLECTOMY      Family History  Problem Relation Age of Onset  . Thyroid disease Mother   . Aortic aneurysm Mother        heart  . Diabetes Mother   . Hypertension Maternal Aunt   . Hypertension Maternal Uncle   . Hypertension Maternal Grandmother    . Diabetes Maternal Grandmother   . Hypertension Sister   . Diabetes Sister   . Asthma Daughter     Social History   Tobacco Use  . Smoking status: Current Some Day Smoker    Packs/day: 0.00    Types: Cigarettes  . Smokeless tobacco: Never Used  . Tobacco comment: 2 cigarettes  Substance Use Topics  . Alcohol use: No  . Drug use: No    Allergies:  Allergies  Allergen Reactions  . Hydroxacen [Hydroxyzine] Itching and Nausea And Vomiting  . Vicodin [Hydrocodone-Acetaminophen] Itching, Nausea And Vomiting and Other (See Comments)    headaches    Medications Prior to Admission  Medication Sig Dispense Refill Last Dose  . cyclobenzaprine (FLEXERIL) 5 MG tablet Take 1 tablet (5 mg total) by mouth 2 (two) times daily as needed for muscle spasms. (Patient not taking: Reported on 05/10/2018) 20 tablet 0   . lamoTRIgine (LAMICTAL) 100 MG tablet Take 200 mg by mouth 2 (two) times daily.  1   . meloxicam (MOBIC) 7.5 MG tablet Take 1 tablet (7.5 mg total) by mouth daily. 15 tablet 0   . pramoxine (PROCTOFOAM) 1 % foam Place 1 application rectally 3 (three) times daily as  needed for anal itching. 15 g 0     Review of Systems  Constitutional: Negative for chills, diaphoresis, fatigue and fever.  Eyes: Negative for visual disturbance.  Respiratory: Negative for shortness of breath.   Cardiovascular: Negative for chest pain.  Gastrointestinal: Negative for abdominal pain, constipation, diarrhea, nausea and vomiting.  Genitourinary: Positive for pelvic pain and vaginal bleeding. Negative for dysuria, flank pain, frequency, urgency and vaginal discharge.  Neurological: Negative for dizziness, weakness, light-headedness and headaches.   Physical Exam   Blood pressure (!) 104/55, pulse 87, temperature 98 F (36.7 C), temperature source Oral, resp. rate 18, height  (1.676 m), weight 85.5 kg, last menstrual period 03/08/2019, SpO2 100 %, unknown if currently breastfeeding.  Patient  Vitals for the past 24 hrs:  BP Temp Temp src Pulse Resp SpO2 Height Weight  05/10/19 1439 (!) 104/55 98 F (36.7 C) Oral 87 18 100 %  (1.676 m) 85.5 kg   Physical Exam  Constitutional: She is oriented to person, place, and time. She appears well-developed and well-nourished. No distress.  HENT:  Head: Normocephalic and atraumatic.  Respiratory: Effort normal.  GI: Soft. She exhibits no distension and no mass. There is no abdominal tenderness. There is no rebound and no guarding.  Neurological: She is alert and oriented to person, place, and time.  Skin: Skin is warm and dry. She is not diaphoretic.  Psychiatric: She has a normal mood and affect. Her behavior is normal. Judgment and thought content normal.   Results for orders placed or performed during the hospital encounter of 05/10/19 (from the past 24 hour(s))  Pregnancy, urine POC     Status: Abnormal   Collection Time: 05/10/19  2:12 PM  Result Value Ref Range   Preg Test, Ur POSITIVE (A) NEGATIVE  Urinalysis, Routine w reflex microscopic     Status: None   Collection Time: 05/10/19  2:34 PM  Result Value Ref Range   Color, Urine YELLOW YELLOW   APPearance CLEAR CLEAR   Specific Gravity, Urine 1.021 1.005 - 1.030   pH 6.0 5.0 - 8.0   Glucose, UA NEGATIVE NEGATIVE mg/dL   Hgb urine dipstick NEGATIVE NEGATIVE   Bilirubin Urine NEGATIVE NEGATIVE   Ketones, ur NEGATIVE NEGATIVE mg/dL   Protein, ur NEGATIVE NEGATIVE mg/dL   Nitrite NEGATIVE NEGATIVE   Leukocytes,Ua NEGATIVE NEGATIVE  CBC     Status: Abnormal   Collection Time: 05/10/19  2:36 PM  Result Value Ref Range   WBC 5.4 4.0 - 10.5 K/uL   RBC 3.83 (L) 3.87 - 5.11 MIL/uL   Hemoglobin 12.7 12.0 - 15.0 g/dL   HCT 04.5 40.9 - 81.1 %   MCV 94.0 80.0 - 100.0 fL   MCH 33.2 26.0 - 34.0 pg   MCHC 35.3 30.0 - 36.0 g/dL   RDW 91.4 78.2 - 95.6 %   Platelets 286 150 - 400 K/uL   nRBC 0.0 0.0 - 0.2 %  hCG, quantitative, pregnancy     Status: Abnormal   Collection Time:  05/10/19  2:36 PM  Result Value Ref Range   hCG, Beta Chain, Quant, S 94,665 (H) <5 mIU/mL  Comprehensive metabolic panel     Status: Abnormal   Collection Time: 05/10/19  2:36 PM  Result Value Ref Range   Sodium 135 135 - 145 mmol/L   Potassium 3.7 3.5 - 5.1 mmol/L   Chloride 107 98 - 111 mmol/L   CO2 21 (L) 22 - 32 mmol/L   Glucose,  Bld 95 70 - 99 mg/dL   BUN 8 6 - 20 mg/dL   Creatinine, Ser 1.610.65 0.44 - 1.00 mg/dL   Calcium 8.8 (L) 8.9 - 10.3 mg/dL   Total Protein 6.4 (L) 6.5 - 8.1 g/dL   Albumin 3.3 (L) 3.5 - 5.0 g/dL   AST 18 15 - 41 U/L   ALT 13 0 - 44 U/L   Alkaline Phosphatase 53 38 - 126 U/L   Total Bilirubin 0.5 0.3 - 1.2 mg/dL   GFR calc non Af Amer >60 >60 mL/min   GFR calc Af Amer >60 >60 mL/min   Anion gap 7 5 - 15  Wet prep, genital     Status: Abnormal   Collection Time: 05/10/19  3:03 PM   Specimen: PATH Cytology Cervicovaginal Ancillary Only  Result Value Ref Range   Yeast Wet Prep HPF POC NONE SEEN NONE SEEN   Trich, Wet Prep NONE SEEN NONE SEEN   Clue Cells Wet Prep HPF POC PRESENT (A) NONE SEEN   WBC, Wet Prep HPF POC NONE SEEN NONE SEEN   Sperm NONE SEEN    [place] Ob Comp Less 14 Wks  Result Date: 05/10/2019 CLINICAL DATA:  Bleeding EXAM: OBSTETRIC <14 WK ULTRASOUND TECHNIQUE: Transabdominal ultrasound was performed for evaluation of the gestation as well as the maternal uterus and adnexal regions. COMPARISON:  None recent FINDINGS: Intrauterine gestational sac: Single Yolk sac:  Visualized. Embryo:  Visualized. Cardiac Activity: Visualized. Heart Rate: 177 bpm CRL: 28 mm   9 w 4 d                  US EDC: 12/09/2019 Subchorionic hemorrhage: There is a small amount of subchorionic hemorrhage. Maternal uterus/adnexae: No maternal abnormality detected on this exam. IMPRESSION: Single live IUP at 9 weeks and 4 days as detailed above. Electronically Signed   By: Katherine Mantlehristopher  Green M.D.   On: 05/10/2019 16:24    MAU Course  Procedures  MDM -r/o ectopic -UA:  WNL -CBC: WNL, H/H 12.7/36 -CMP: no abnormalities requiring treatment -US: single IUP, 2714w4d, [id number], sm subchorionic hemorrhage -hCG: 40,98194,[id number] -ABO: O Positive -WetPrep: +ClueCells (isolated finding not requiring treatment) -GC/CT collected -pt discharged to home in stable condition  Orders Placed This Encounter  Procedures  . Wet prep, genital    Standing Status:   Standing    Number of Occurrences:   1  . US OB Comp Less 14 Wks    Standing Status:   Standing    Number of Occurrences:   1    Order Specific Question:   Symptom/Reason for Exam    Answer:   Vaginal bleeding in pregnancy [705036]  . CBC    Standing Status:   Standing    Number of Occurrences:   1  . hCG, quantitative, pregnancy    Standing Status:   Standing    Number of Occurrences:   1  . Comprehensive metabolic panel    Standing Status:   Standing    Number of Occurrences:   1  . Urinalysis, Routine w reflex microscopic    Standing Status:   Standing    Number of Occurrences:   1  . Pregnancy, urine POC    Standing Status:   Standing    Number of Occurrences:   1  . Discharge patient    Order Specific Question:   Discharge disposition    Answer:   01-Home or Self Care [1]    Order Specific Question:  Discharge patient date    Answer:   05/10/2019   Assessment and Plan   1. Intrauterine pregnancy   2. Abdominal pain in pregnancy   3. Vaginal bleeding in pregnancy, first trimester   4. Pelvic cramping   5. Subchorionic hemorrhage of placenta in first trimester, single or unspecified fetus   6. [redacted] weeks gestation of pregnancy    Allergies as of 05/10/2019      Reactions   Hydroxacen [hydroxyzine] Itching, Nausea And Vomiting   Vicodin [hydrocodone-acetaminophen] Itching, Nausea And Vomiting, Other (See Comments)   headaches      Medication List    STOP taking these medications   meloxicam 7.5 MG tablet Commonly known as: Mobic     TAKE these medications   cyclobenzaprine 5 MG  tablet Commonly known as: FLEXERIL Take 1 tablet (5 mg total) by mouth 2 (two) times daily as needed for muscle spasms.   lamoTRIgine 100 MG tablet Commonly known as: LAMICTAL Take 200 mg by mouth 2 (two) times daily.   pramoxine 1 % foam Commonly known as: PROCTOFOAM Place 1 application rectally 3 (three) times daily as needed for anal itching.      -will call with culture results, if positive -pt to keep appt 05/13/2019 for NOB intake at [facility] -strict bleeding/pain/return MAU precautions given -pt discharged to home in stable condition  Joni Reining E Rohaan Durnil 05/10/2019, 4:32 PM

## 2019-05-10 NOTE — Discharge Instructions (Signed)
Subchorionic Hematoma ° °A subchorionic hematoma is a gathering of blood between the outer wall of the embryo (chorion) and the inner wall of the womb (uterus). °This condition can cause vaginal bleeding. If they cause little or no vaginal bleeding, early small hematomas usually shrink on their own and do not affect your baby or pregnancy. When bleeding starts later in pregnancy, or if the hematoma is larger or occurs in older pregnant women, the condition may be more serious. Larger hematomas may get bigger, which increases the chances of miscarriage. This condition also increases the risk of: °· Premature separation of the placenta from the uterus. °· Premature (preterm) labor. °· Stillbirth. °What are the causes? °The exact cause of this condition is not known. It occurs when blood is trapped between the placenta and the uterine wall because the placenta has separated from the original site of implantation. °What increases the risk? °You are more likely to develop this condition if: °· You were treated with fertility medicines. °· You conceived through in vitro fertilization (IVF). °What are the signs or symptoms? °Symptoms of this condition include: °· Vaginal spotting or bleeding. °· Contractions of the uterus. These cause abdominal pain. °Sometimes you may have no symptoms and the bleeding may only be seen when ultrasound images are taken (transvaginal ultrasound). °How is this diagnosed? °This condition is diagnosed based on a physical exam. This includes a pelvic exam. You may also have other tests, including: °· Blood tests. °· Urine tests. °· Ultrasound of the abdomen. °How is this treated? °Treatment for this condition can vary. Treatment may include: °· Watchful waiting. You will be monitored closely for any changes in bleeding. During this stage: °? The hematoma may be reabsorbed by the body. °? The hematoma may separate the fluid-filled space containing the embryo (gestational sac) from the wall of the  womb (endometrium). °· Medicines. °· Activity restriction. This may be needed until the bleeding stops. °Follow these instructions at home: °· Stay on bed rest if told to do so by your health care provider. °· Do not lift anything that is heavier than 10 lbs. (4.5 kg) or as told by your health care provider. °· Do not use any products that contain nicotine or tobacco, such as cigarettes and e-cigarettes. If you need help quitting, ask your health care provider. °· Track and write down the number of pads you use each day and how soaked (saturated) they are. °· Do not use tampons. °· Keep all follow-up visits as told by your health care provider. This is important. Your health care provider may ask you to have follow-up blood tests or ultrasound tests or both. °Contact a health care provider if: °· You have any vaginal bleeding. °· You have a fever. °Get help right away if: °· You have severe cramps in your stomach, back, abdomen, or pelvis. °· You pass large clots or tissue. Save any tissue for your health care provider to look at. °· You have more vaginal bleeding, and you faint or become lightheaded or weak. °Summary °· A subchorionic hematoma is a gathering of blood between the outer wall of the placenta and the uterus. °· This condition can cause vaginal bleeding. °· Sometimes you may have no symptoms and the bleeding may only be seen when ultrasound images are taken. °· Treatment may include watchful waiting, medicines, or activity restriction. °This information is not intended to replace advice given to you by your health care provider. Make sure you discuss any questions you   have with your health care provider. Document Released: 09/19/2006 Document Revised: 05/17/2017 Document Reviewed: 07/31/2016 Elsevier Patient Education  2020 [employer]   Abdominal Pain During Pregnancy  Abdominal pain is common during pregnancy, and has many possible causes. Some causes are more serious than others, and  sometimes the cause is not known. Abdominal pain can be a sign that labor is starting. It can also be caused by normal growth and stretching of muscles and ligaments during pregnancy. Always tell your health care provider if you have any abdominal pain. Follow these instructions at home:  Do not have sex or put anything in your vagina until your pain goes away completely.  Get plenty of rest until your pain improves.  Drink enough fluid to keep your urine pale yellow.  Take over-the-counter and prescription medicines only as told by your health care provider.  Keep all follow-up visits as told by your health care provider. This is important. Contact a health care provider if:  Your pain continues or gets worse after resting.  You have lower abdominal pain that: ? Comes and goes at regular intervals. ? Spreads to your back. ? Is similar to menstrual cramps.  You have pain or burning when you urinate. Get help right away if:  You have a fever or chills.  You have vaginal bleeding.  You are leaking fluid from your vagina.  You are passing tissue from your vagina.  You have vomiting or diarrhea that lasts for more than 24 hours.  Your baby is moving less than usual.  You feel very weak or faint.  You have shortness of breath.  You develop severe pain in your upper abdomen. Summary  Abdominal pain is common during pregnancy, and has many possible causes.  If you experience abdominal pain during pregnancy, tell your health care provider right away.  Follow your health care provider's home care instructions and keep all follow-up visits as directed. This information is not intended to replace advice given to you by your health care provider. Make sure you discuss any questions you have with your health care provider. Document Released: 06/04/2005 Document Revised: 09/22/2018 Document Reviewed: 09/06/2016 Elsevier Patient Education  2020 [employer]  Vaginal Bleeding  During Pregnancy, First Trimester  A small amount of bleeding from the vagina (spotting) is relatively common during early pregnancy. It usually stops on its own. Various things may cause bleeding or spotting during early pregnancy. Some bleeding may be related to the pregnancy, and some may not. In many cases, the bleeding is normal and is not a problem. However, bleeding can also be a sign of something serious. Be sure to tell your health care provider about any vaginal bleeding right away. Some possible causes of vaginal bleeding during the first trimester include:  Infection or inflammation of the cervix.  Growths (polyps) on the cervix.  Miscarriage or threatened miscarriage.  Pregnancy tissue developing outside of the uterus (ectopic pregnancy).  A mass of tissue developing in the uterus due to an egg being fertilized incorrectly (molar pregnancy). Follow these instructions at home: Activity  Follow instructions from your health care provider about limiting your activity. Ask what activities are safe for you.  If needed, make plans for someone to help with your regular activities.  Do not have sex or orgasms until your health care provider says that this is safe. General instructions  Take over-the-counter and prescription medicines only as told by your health care provider.  Pay attention to any changes in  your symptoms.  Do not use tampons or douche.  Write down how many pads you use each day, how often you change pads, and how soaked (saturated) they are.  If you pass any tissue from your vagina, save the tissue so you can show it to your health care provider.  Keep all follow-up visits as told by your health care provider. This is important. Contact a health care provider if:  You have vaginal bleeding during any part of your pregnancy.  You have cramps or labor pains.  You have a fever. Get help right away if:  You have severe cramps in your back or  abdomen.  You pass large clots or a large amount of tissue from your vagina.  Your bleeding increases.  You feel light-headed or weak, or you faint.  You have chills.  You are leaking fluid or have a gush of fluid from your vagina. Summary  A small amount of bleeding (spotting) from the vagina is relatively common during early pregnancy.  Various things may cause bleeding or spotting in early pregnancy.  Be sure to tell your health care provider about any vaginal bleeding right away. This information is not intended to replace advice given to you by your health care provider. Make sure you discuss any questions you have with your health care provider. Document Released: 03/14/2005 Document Revised: 09/23/2018 Document Reviewed: 09/06/2016 Elsevier Patient Education  2020 [place] [employer] Pregnancy The first trimester of pregnancy is from week 1 until the end of week 13 (months 1 through 3). A week after a sperm fertilizes an egg, the egg will implant on the wall of the uterus. This embryo will begin to develop into a baby. Genes from you and your partner will form the baby. The female genes will determine whether the baby will be a boy or a girl. At 6-8 weeks, the eyes and face will be formed, and the heartbeat can be seen on ultrasound. At the end of 12 weeks, all the baby's organs will be formed. Now that you are pregnant, you will want to do everything you can to have a healthy baby. Two of the most important things are to get good prenatal care and to follow your health care provider's instructions. Prenatal care is all the medical care you receive before the baby's birth. This care will help prevent, find, and treat any problems during the pregnancy and childbirth. Body changes during your first trimester Your body goes through many changes during pregnancy. The changes vary from woman to woman.  You may gain or lose a couple of pounds at first.  You may feel sick to  your stomach (nauseous) and you may throw up (vomit). If the vomiting is uncontrollable, call your health care provider.  You may tire easily.  You may develop headaches that can be relieved by medicines. All medicines should be approved by your health care provider.  You may urinate more often. Painful urination may mean you have a bladder infection.  You may develop heartburn as a result of your pregnancy.  You may develop constipation because certain hormones are causing the muscles that push stool through your intestines to slow down.  You may develop hemorrhoids or swollen veins (varicose veins).  Your breasts may begin to grow larger and become tender. Your nipples may stick out more, and the tissue that surrounds them (areola) may become darker.  Your gums may bleed and may be sensitive to brushing and flossing.  Dark spots or blotches (chloasma, mask of pregnancy) may develop on your face. This will likely fade after the baby is born.  Your menstrual periods will stop.  You may have a loss of appetite.  You may develop cravings for certain kinds of food.  You may have changes in your emotions from day to day, such as being excited to be pregnant or being concerned that something may go wrong with the pregnancy and baby.  You may have more vivid and strange dreams.  You may have changes in your hair. These can include thickening of your hair, rapid growth, and changes in texture. Some women also have hair loss during or after pregnancy, or hair that feels dry or thin. Your hair will most likely return to normal after your baby is born. What to expect at prenatal visits During a routine prenatal visit:  You will be weighed to make sure you and the baby are growing normally.  Your blood pressure will be taken.  Your abdomen will be measured to track your baby's growth.  The fetal heartbeat will be listened to between weeks 10 and 14 of your pregnancy.  Test results from  any previous visits will be discussed. Your health care provider may ask you:  How you are feeling.  If you are feeling the baby move.  If you have had any abnormal symptoms, such as leaking fluid, bleeding, severe headaches, or abdominal cramping.  If you are using any tobacco products, including cigarettes, chewing tobacco, and electronic cigarettes.  If you have any questions. Other tests that may be performed during your first trimester include:  Blood tests to find your blood type and to check for the presence of any previous infections. The tests will also be used to check for low iron levels (anemia) and protein on red blood cells (Rh antibodies). Depending on your risk factors, or if you previously had diabetes during pregnancy, you may have tests to check for high blood sugar that affects pregnant women (gestational diabetes).  Urine tests to check for infections, diabetes, or protein in the urine.  An ultrasound to confirm the proper growth and development of the baby.  Fetal screens for spinal cord problems (spina bifida) and Down syndrome.  HIV (human immunodeficiency virus) testing. Routine prenatal testing includes screening for HIV, unless you choose not to have this test.  You may need other tests to make sure you and the baby are doing well. Follow these instructions at home: Medicines  Follow your health care provider's instructions regarding medicine use. Specific medicines may be either safe or unsafe to take during pregnancy.  Take a prenatal vitamin that contains at least 600 micrograms (mcg) of folic acid.  If you develop constipation, try taking a stool softener if your health care provider approves. Eating and drinking   Eat a balanced diet that includes fresh fruits and vegetables, whole grains, good sources of protein such as meat, eggs, or tofu, and low-fat dairy. Your health care provider will help you determine the amount of weight gain that is right  for you.  Avoid raw meat and uncooked cheese. These carry germs that can cause birth defects in the baby.  Eating four or five small meals rather than three large meals a day may help relieve nausea and vomiting. If you start to feel nauseous, eating a few soda crackers can be helpful. Drinking liquids between meals, instead of during meals, also seems to help ease nausea and vomiting.  Limit  foods that are high in fat and processed sugars, such as fried and sweet foods.  To prevent constipation: ? Eat foods that are high in fiber, such as fresh fruits and vegetables, whole grains, and beans. ? Drink enough fluid to keep your urine clear or pale yellow. Activity  Exercise only as directed by your health care provider. Most women can continue their usual exercise routine during pregnancy. Try to exercise for 30 minutes at least 5 days a week. Exercising will help you: ? Control your weight. ? Stay in shape. ? Be prepared for labor and delivery.  Experiencing pain or cramping in the lower abdomen or lower back is a good sign that you should stop exercising. Check with your health care provider before continuing with normal exercises.  Try to avoid standing for long periods of time. Move your legs often if you must stand in one place for a long time.  Avoid heavy lifting.  Wear low-heeled shoes and practice good posture.  You may continue to have sex unless your health care provider tells you not to. Relieving pain and discomfort  Wear a good support bra to relieve breast tenderness.  Take warm sitz baths to soothe any pain or discomfort caused by hemorrhoids. Use hemorrhoid cream if your health care provider approves.  Rest with your legs elevated if you have leg cramps or low back pain.  If you develop varicose veins in your legs, wear support hose. Elevate your feet for 15 minutes, 3-4 times a day. Limit salt in your diet. Prenatal care  Schedule your prenatal visits by the  twelfth week of pregnancy. They are usually scheduled monthly at first, then more often in the last 2 months before delivery.  Write down your questions. Take them to your prenatal visits.  Keep all your prenatal visits as told by your health care provider. This is important. Safety  Wear your seat belt at all times when driving.  Make a list of emergency phone numbers, including numbers for family, friends, the hospital, and police and fire departments. General instructions  Ask your health care provider for a referral to a local prenatal education class. Begin classes no later than the beginning of month 6 of your pregnancy.  Ask for help if you have counseling or nutritional needs during pregnancy. Your health care provider can offer advice or refer you to specialists for help with various needs.  Do not use hot tubs, steam rooms, or saunas.  Do not douche or use tampons or scented sanitary pads.  Do not cross your legs for long periods of time.  Avoid cat litter boxes and soil used by cats. These carry germs that can cause birth defects in the baby and possibly loss of the fetus by miscarriage or stillbirth.  Avoid all smoking, herbs, alcohol, and medicines not prescribed by your health care provider. Chemicals in these products affect the formation and growth of the baby.  Do not use any products that contain nicotine or tobacco, such as cigarettes and e-cigarettes. If you need help quitting, ask your health care provider. You may receive counseling support and other resources to help you quit.  Schedule a dentist appointment. At home, brush your teeth with a soft toothbrush and be gentle when you floss. Contact a health care provider if:  You have dizziness.  You have mild pelvic cramps, pelvic pressure, or nagging pain in the abdominal area.  You have persistent nausea, vomiting, or diarrhea.  You have a bad  smelling vaginal discharge.  You have pain when you  urinate.  You notice increased swelling in your face, hands, legs, or ankles.  You are exposed to fifth disease or chickenpox.  You are exposed to [place] measles (rubella) and have never had it. Get help right away if:  You have a fever.  You are leaking fluid from your vagina.  You have spotting or bleeding from your vagina.  You have severe abdominal cramping or pain.  You have rapid weight gain or loss.  You vomit blood or material that looks like coffee grounds.  You develop a severe headache.  You have shortness of breath.  You have any kind of trauma, such as from a fall or a car accident. Summary  The first trimester of pregnancy is from week 1 until the end of week 13 (months 1 through 3).  Your body goes through many changes during pregnancy. The changes vary from woman to woman.  You will have routine prenatal visits. During those visits, your health care provider will examine you, discuss any test results you may have, and talk with you about how you are feeling. This information is not intended to replace advice given to you by your health care provider. Make sure you discuss any questions you have with your health care provider. Document Released: 05/29/2001 Document Revised: 05/17/2017 Document Reviewed: 05/16/2016 Elsevier Patient Education  2020 [employer].

## 2019-05-11 LAB — GC/CHLAMYDIA PROBE AMP (~~LOC~~) NOT AT ARMC
Chlamydia: NEGATIVE
Comment: NEGATIVE
Comment: NORMAL
Neisseria Gonorrhea: NEGATIVE

## 2019-05-13 ENCOUNTER — Ambulatory Visit (INDEPENDENT_AMBULATORY_CARE_PROVIDER_SITE_OTHER): Payer: Medicaid Other | Admitting: *Deleted

## 2019-05-13 ENCOUNTER — Encounter: Payer: Self-pay | Admitting: *Deleted

## 2019-05-13 ENCOUNTER — Other Ambulatory Visit: Payer: Self-pay

## 2019-05-13 DIAGNOSIS — O09529 Supervision of elderly multigravida, unspecified trimester: Secondary | ICD-10-CM | POA: Insufficient documentation

## 2019-05-13 DIAGNOSIS — G40909 Epilepsy, unspecified, not intractable, without status epilepticus: Secondary | ICD-10-CM | POA: Insufficient documentation

## 2019-05-13 DIAGNOSIS — Z8679 Personal history of other diseases of the circulatory system: Secondary | ICD-10-CM

## 2019-05-13 DIAGNOSIS — O0990 Supervision of high risk pregnancy, unspecified, unspecified trimester: Secondary | ICD-10-CM

## 2019-05-13 MED ORDER — FOLIC ACID 1 MG PO TABS: 2.0000 mg | ORAL_TABLET | Freq: Two times a day (BID) | ORAL | 0 refills | Status: DC

## 2019-05-13 MED ORDER — PREPLUS 27-1 MG PO TABS: 1.0000 | ORAL_TABLET | Freq: Every day | ORAL | 6 refills | Status: DC

## 2019-05-13 MED ORDER — BLOOD PRESSURE KIT DEVI: 1.0000 | 0 refills | Status: DC | PRN

## 2019-05-13 NOTE — Progress Notes (Signed)
9:15 addendum I called [place] back and informed her I had more information about signing up for the registry from Dr. Kennon Rounds and I can give her over the phone or send message in [place]. She asked I send in [place]. I explained she will call the number to sign up. She voices understanding.  Linda,RN

## 2019-05-13 NOTE — Progress Notes (Signed)
0824 I called Hannah Cook for her telephone visit and left a message I was calling for her telephone visit and will call again in a few minutes; please be available by phone.  Hannah Liwanag,RN I connected with  Hannah Cook on 05/13/19 at  8:30 AM EST by telephone and verified that I am speaking with the correct person using two identifiers.   I discussed the limitations, risks, security and privacy concerns of performing an evaluation and management service by telephone and the availability of in person appointments. I also discussed with the patient that there may be a patient responsible charge related to this service. The patient expressed understanding and agreed to proceed. Explained I am completing her New OB Intake today. We discussed Her EDD and that it is based on  unsure LMP and confirmed by [place] . I reviewed her allergies, meds, OB History, Medical /Surgical history, and appropriate screenings. She is on lamictal . She states she has been on this for 8 years and during her last 2 children and is followed at [facility]. I discussed with Dr. Kennon Rounds and it is recommended she take extra folic acid and a  prenatal vitamin and be on registry. She states she doesn't take a PNV because she doesn't like them. I explained due to her age and being on the medicine it is recommended she take PNV and extra folic acid. I informed her I can send a different PNV which should be smaller and she can see if she can tolerate it ; if not  May try otc PNV Gummie. I will also send folic acid to her pharmacy.  I explained I will send her the [employer] app- app sent to her while on phone but she did not receive while on phone with me.   I explained we will send a blood pressure cuff to [facility] that will fill that prescription and they  will call her to verify her information. I asked her to bring the blood pressure cuff with her to her first ob appointment so we can show her how to use it. Explained  then we will have her  take her blood pressure weekly and enter into the app. Explained she will have some visits in office and some virtually. I sent her MyChart text to sign up and she downloaded the  MyChart app. Reviewed appointment date/ time with her , our location and to wear mask, no visitors. Explained she will have exam, ob bloodwork, hemoglobin a1C, cbg , genetic testing if desired, pap if needed. I scheduled an [place] at 19 weeks and gave her the appointment. I also offered her genetic counseling which she accepted.  She voices understanding.  Hannah Zarcone,RN 05/13/2019  8:31 AM

## 2019-05-13 NOTE — Progress Notes (Signed)
Patient seen and assessed by nursing staff during this encounter. I have reviewed the chart and agree with the documentation and plan.  Adewale Pucillo, MD 05/13/2019 11:01 AM    

## 2019-05-13 NOTE — Patient Instructions (Signed)

## 2019-06-03 ENCOUNTER — Encounter: Payer: Medicaid Other | Admitting: Obstetrics and Gynecology

## 2019-06-03 ENCOUNTER — Encounter: Payer: Self-pay | Admitting: Obstetrics & Gynecology

## 2019-07-22 ENCOUNTER — Ambulatory Visit (HOSPITAL_COMMUNITY): Payer: Medicaid Other

## 2019-07-22 ENCOUNTER — Ambulatory Visit (HOSPITAL_COMMUNITY): Payer: Medicaid Other | Attending: Obstetrics and Gynecology

## 2019-07-22 ENCOUNTER — Ambulatory Visit (HOSPITAL_COMMUNITY)
Admission: RE | Admit: 2019-07-22 | Payer: Medicaid Other | Source: Ambulatory Visit | Attending: Obstetrics and Gynecology | Admitting: Obstetrics and Gynecology

## 2019-07-24 ENCOUNTER — Encounter: Payer: Medicaid Other | Admitting: Obstetrics and Gynecology

## 2019-07-29 ENCOUNTER — Encounter: Payer: Self-pay | Admitting: Family Medicine

## 2019-07-29 ENCOUNTER — Telehealth: Payer: Self-pay | Admitting: Family Medicine

## 2019-07-29 ENCOUNTER — Encounter: Payer: Medicaid Other | Admitting: Family Medicine

## 2019-07-29 NOTE — Progress Notes (Signed)
Patient did not keep appointment today. She will be called to reschedule.  

## 2019-07-29 NOTE — Telephone Encounter (Signed)
Attempted to contact patient to get her rescheduled for her missed new ob appointment. No answer, left voicemail for patient to give the office a call back. No show letter mailed.  

## 2019-08-18 ENCOUNTER — Telehealth: Payer: Self-pay | Admitting: Family Medicine

## 2019-08-18 NOTE — Telephone Encounter (Signed)
Attempted to reach the patient to get her rescheduled for an appointment. Left her a voicemail message to call the office so we can speak to her about an appointment.

## 2019-10-18 ENCOUNTER — Inpatient Hospital Stay (HOSPITAL_COMMUNITY)
Admission: AD | Admit: 2019-10-18 | Discharge: 2019-10-18 | Disposition: A | Discharge status: Home / Self Care | Payer: Medicaid Other | Source: Ambulatory Visit | Attending: Obstetrics and Gynecology | Admitting: Obstetrics and Gynecology

## 2019-10-18 ENCOUNTER — Other Ambulatory Visit: Payer: Self-pay

## 2019-10-18 ENCOUNTER — Encounter (HOSPITAL_COMMUNITY): Payer: Self-pay | Admitting: Obstetrics and Gynecology

## 2019-10-18 DIAGNOSIS — R109 Unspecified abdominal pain: Secondary | ICD-10-CM | POA: Diagnosis not present

## 2019-10-18 DIAGNOSIS — N939 Abnormal uterine and vaginal bleeding, unspecified: Secondary | ICD-10-CM | POA: Insufficient documentation

## 2019-10-18 LAB — URINALYSIS, ROUTINE W REFLEX MICROSCOPIC
Bacteria, UA: NONE SEEN
Bilirubin Urine: NEGATIVE
Glucose, UA: NEGATIVE mg/dL
Hgb urine dipstick: NEGATIVE
Ketones, ur: NEGATIVE mg/dL
Leukocytes,Ua: NEGATIVE
Nitrite: NEGATIVE
Protein, ur: 30 mg/dL — AB
Specific Gravity, Urine: 1.027 (ref 1.005–1.030)
pH: 7 (ref 5.0–8.0)

## 2019-10-18 LAB — POCT PREGNANCY, URINE: Preg Test, Ur: NEGATIVE

## 2019-10-18 LAB — HCG, QUANTITATIVE, PREGNANCY: hCG, Beta Chain, Quant, S: 1 m[IU]/mL (ref <5)

## 2019-10-18 NOTE — MAU Note (Signed)
Pt reports to MAU stating she is here for vaginal bleeding and abdominal cramping pt states for the last two days she had heavy bleeding but today it is light. Pt reports the pain is a 5/10.pt states that she had 3+ pregnancy test this week.   Patient reports she had a abortion on Feburary 20th pt states she was 21weeks.   LMP was 09/23/19   12/73 93 98.8

## 2019-10-18 NOTE — MAU Note (Signed)
Pending lab results. Pt called from lobby and stated she could not wait because she needed to go to work. Provider notified. Provider will call with results.

## 2019-12-24 ENCOUNTER — Encounter (HOSPITAL_COMMUNITY): Payer: Self-pay

## 2019-12-24 ENCOUNTER — Other Ambulatory Visit: Payer: Self-pay

## 2019-12-24 ENCOUNTER — Ambulatory Visit (HOSPITAL_COMMUNITY)
Admission: EM | Admit: 2019-12-24 | Discharge: 2019-12-24 | Disposition: A | Discharge status: Home / Self Care | Payer: Medicaid Other | Attending: Family Medicine | Admitting: Family Medicine

## 2019-12-24 DIAGNOSIS — N898 Other specified noninflammatory disorders of vagina: Secondary | ICD-10-CM

## 2019-12-24 NOTE — ED Provider Notes (Signed)
[place]    CSN: [id number] Arrival date & time: 12/24/19  1729      History   Chief Complaint Chief Complaint  Patient presents with   Vaginal Discharge    HPI Hannah Cook is a 39 y.o. female.   She is presenting with vaginal discharge.  Her symptoms started a few days ago.  She is having white discharge.  She also has concern for bumps around the labia.  She denies any potential of pregnancy.  No fevers or chills.  No abdominal pain.  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   Thyroid disease     Patient Active Problem List   Diagnosis Date Noted   Supervision of high risk pregnancy, antepartum 05/13/2019   AMA (advanced maternal age) multigravida 35+ 05/13/2019   Seizure disorder during pregnancy ([place]) 05/13/2019   Vaginal bleeding in pregnancy, third trimester 04/08/2017   Tobacco smoking complicating pregnancy 22/07/5425   History of intracranial aneurysm s/p coiling 11/05/2016   Obesity (BMI 30.0-34.9) 02/16/2014   Anxiety 03/10/2012    Past Surgical History:  Procedure Laterality Date   ANEURYSM COILING     BRAIN SURGERY     TONSILLECTOMY      OB History    Gravida  7   Para  4   Term  4   Preterm      AB  3   Living  4     SAB  1   TAB  1   Ectopic  0   Multiple  0   Live Births  4            Home Medications    Prior to Admission medications   Medication Sig Start Date End Date Taking? Authorizing Provider  Blood Pressure Monitoring (BLOOD PRESSURE KIT) DEVI 1 Device by Does not apply route as needed. 05/13/19   Constant, Peggy, MD  folic acid (FOLVITE) 1 MG tablet Take 2 tablets (2 mg total) by mouth 2 (two) times daily. 05/13/19   Donnamae Jude, MD  lamoTRIgine (LAMICTAL) 100 MG tablet Take 200 mg by mouth 2 (two) times daily. 03/11/17   [provider]  phentermine (ADIPEX-P) 37.5 MG tablet Take 37.5 mg by mouth every morning. 12/14/19   [provider]  Vitamin D, Ergocalciferol, (DRISDOL) 1.25 MG (50000 UT) CAPS capsule Take 50,000 Units by mouth once a week. 04/21/19   [provider]  topiramate (TOPAMAX) 25 MG tablet Take 25 mg by mouth 2 (two) times daily.  09/06/11  [provider]    Family History Family History  Problem Relation Age of Onset   Thyroid disease Mother    Aortic aneurysm Mother        heart   Diabetes Mother    Hypertension Maternal Aunt    Hypertension Maternal Uncle    Hypertension Maternal Grandmother    Diabetes Maternal Grandmother    Hypertension Sister    Diabetes Sister    Asthma Daughter     Social History Social History   Tobacco Use   Smoking status: Current Some Day Smoker    Packs/day: 1.00    Types: Cigarettes   Smokeless tobacco: Never Used   Tobacco comment: 2 cigarettes  Vaping Use   Vaping Use: Never used  Substance Use Topics   Alcohol use: Not Currently   Drug use: No     Allergies   Hydroxacen [hydroxyzine] and Vicodin [hydrocodone-acetaminophen]   Review of Systems Review of Systems  See HPI   Physical Exam Triage Vital Signs ED Triage Vitals [12/24/19 1828]  Enc Vitals Group     BP 113/75     Pulse Rate 89     Resp 16     Temp 98.4 F (36.9 C)     Temp Source Oral     SpO2 100 %     Weight 167 lb (75.8 kg)     Height '5\' 6"'  (1.676 m)     Head Circumference      Peak Flow      Pain Score 0     Pain Loc      Pain Edu?      Excl. in [place]?    No data found.  Updated Vital Signs BP 113/75    Pulse 89    Temp 98.4 F (36.9 C) (Oral)    Resp 16    Ht '5\' 6"'  (1.676 m)    Wt 75.8 kg    SpO2 100%    BMI 26.95 kg/m   Visual Acuity Right Eye Distance:   Left Eye Distance:   Bilateral Distance:    Right Eye Near:   Left Eye Near:    Bilateral Near:     Physical Exam Gen: NAD, alert, cooperative  with exam, well-appearing ENT: normal lips, normal nasal mucosa,  Eye: normal EOM, normal conjunctiva and lids GI: no masses or tenderness, no hernia  GU: nodules appreciated around the labia  Skin: no rashes, no areas of induration  Neuro: normal tone, normal sensation to touch Psych:  normal insight, alert and oriented     UC Treatments / Results  Labs (all labs ordered are listed, but only abnormal results are displayed) Labs Reviewed  CERVICOVAGINAL ANCILLARY ONLY    EKG   Radiology No results found.  Procedures Procedures (including critical care time)  Medications Ordered in UC Medications - No data to display  Initial Impression / Assessment and Plan / UC Course  I have reviewed the triage vital signs and the nursing notes.  Pertinent labs & imaging results that were available during my care of the patient were reviewed by me and considered in my medical decision making (see chart for details).     Hannah Cook is a 39 yo F that presented with vaginal discharge.  Cytology swab was obtained.  Nodules appreciated around the labia appear to be related to glands and not a herpes infection.  Provided information for surgery follow-up.  Counseled supportive care.  Given indications to follow-up.   Final Clinical Impressions(s) / UC Diagnoses   Final diagnoses:  Vaginal discharge     Discharge Instructions     We will call with the results and send medicine in as indicated.  Please try to make an appointment with the surgeon  Please follow up if your symptoms fail to improve.     ED Prescriptions    None     PDMP not reviewed this encounter.   Rosemarie Ax, MD 12/24/19 2219

## 2019-12-24 NOTE — ED Triage Notes (Addendum)
Pt c/o thick white discharge and vaginal odorx1 wk. Pt has bumps on vagina she wants looked at.

## 2019-12-24 NOTE — Discharge Instructions (Signed)
We will call with the results and send medicine in as indicated.  Please try to make an appointment with the surgeon  Please follow up if your symptoms fail to improve.

## 2019-12-25 LAB — CERVICOVAGINAL ANCILLARY ONLY
Bacterial Vaginitis (gardnerella): NEGATIVE
Candida Glabrata: NEGATIVE
Candida Vaginitis: NEGATIVE
Chlamydia: NEGATIVE
Comment: NEGATIVE
Comment: NEGATIVE
Comment: NEGATIVE
Comment: NEGATIVE
Comment: NEGATIVE
Comment: NORMAL
Neisseria Gonorrhea: NEGATIVE
Trichomonas: NEGATIVE

## 2020-03-04 ENCOUNTER — Other Ambulatory Visit: Payer: Self-pay

## 2020-03-04 ENCOUNTER — Ambulatory Visit (HOSPITAL_COMMUNITY)
Admission: EM | Admit: 2020-03-04 | Discharge: 2020-03-04 | Disposition: A | Discharge status: Home / Self Care | Payer: Medicaid Other

## 2020-03-05 ENCOUNTER — Other Ambulatory Visit: Payer: Self-pay

## 2020-03-05 ENCOUNTER — Ambulatory Visit (HOSPITAL_COMMUNITY)
Admission: EM | Admit: 2020-03-05 | Discharge: 2020-03-05 | Disposition: A | Discharge status: Home / Self Care | Payer: Medicaid Other | Attending: Emergency Medicine | Admitting: Emergency Medicine

## 2020-03-05 ENCOUNTER — Encounter (HOSPITAL_COMMUNITY): Payer: Self-pay | Admitting: *Deleted

## 2020-03-05 DIAGNOSIS — L02412 Cutaneous abscess of left axilla: Secondary | ICD-10-CM | POA: Diagnosis not present

## 2020-03-05 DIAGNOSIS — Z3202 Encounter for pregnancy test, result negative: Secondary | ICD-10-CM

## 2020-03-05 DIAGNOSIS — N898 Other specified noninflammatory disorders of vagina: Secondary | ICD-10-CM | POA: Diagnosis present

## 2020-03-05 DIAGNOSIS — B9689 Other specified bacterial agents as the cause of diseases classified elsewhere: Secondary | ICD-10-CM | POA: Diagnosis not present

## 2020-03-05 DIAGNOSIS — N760 Acute vaginitis: Secondary | ICD-10-CM | POA: Diagnosis not present

## 2020-03-05 LAB — POCT URINALYSIS DIPSTICK, ED / UC
Bilirubin Urine: NEGATIVE
Glucose, UA: NEGATIVE mg/dL
Hgb urine dipstick: NEGATIVE
Ketones, ur: NEGATIVE mg/dL
Leukocytes,Ua: NEGATIVE
Nitrite: POSITIVE — AB
Protein, ur: NEGATIVE mg/dL
Specific Gravity, Urine: 1.025 (ref 1.005–1.030)
Urobilinogen, UA: 0.2 mg/dL (ref 0.0–1.0)
pH: 6.5 (ref 5.0–8.0)

## 2020-03-05 LAB — POC URINE PREG, ED: Preg Test, Ur: NEGATIVE

## 2020-03-05 MED ORDER — DOXYCYCLINE HYCLATE 100 MG PO CAPS: 100.0000 mg | ORAL_CAPSULE | Freq: Two times a day (BID) | ORAL | 0 refills | Status: AC

## 2020-03-05 MED ORDER — METRONIDAZOLE 500 MG PO TABS: 500.0000 mg | ORAL_TABLET | Freq: Two times a day (BID) | ORAL | 0 refills | Status: AC

## 2020-03-05 NOTE — Discharge Instructions (Addendum)
Abscess Please begin doxycycline for 10 days  Apply warm compresses/hot rags to area with massage to express further drainage especially the first 24-48 hours  Return if symptoms returning or not improving Discharge Begin metronidazole twice daily for 1 week for bacterial vaginosis.   We are testing you for Gonorrhea, Chlamydia, Trichomonas, Yeast and Bacterial Vaginosis. We will call you if anything is positive and let you know if you require any further treatment. Please inform partners of any positive results.   Please return if symptoms not improving with treatment, development of fever, nausea, vomiting, abdominal pain.

## 2020-03-05 NOTE — ED Triage Notes (Signed)
Pt reports hx recurring abscesses; has appt with general surgeon. C/O left axillary abscess x 1 wk with progressive worsening.  Also c/o malodorous vaginal discharge x 1.5 wks.

## 2020-03-05 NOTE — ED Notes (Signed)
Sent patient to bathroom to gather specimens

## 2020-03-06 NOTE — ED Provider Notes (Signed)
[facility]    CSN: [id number] Arrival date & time: 03/05/20  1601      History   Chief Complaint Chief Complaint  Patient presents with   Abscess   Vaginal Discharge    HPI Hannah Cook is a 39 y.o. female presenting today for evaluation of axilla abscess and vaginal discharge.  Patient reports recurrent history of abscesses to axilla and groin.  Has plans to follow-up with surgery in 1 month.  She reports that she has had an abscess to her left axilla which has progressively worsened over the past week.  Is slightly smaller and has been, but has not spontaneously drained.  Has had some discharge which she describes as malodorous.  Denies associated itching or irritation.  Reports symptoms began after using a new toy.  Also reports scented soaps.  Denies pain.  Denies urinary symptoms.  HPI  Past Medical History:  Diagnosis Date   Aneurysm ([place]) 2002   Asthma    Hypothyroidism    Panic attack    Seizures ([place])    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   Thyroid disease     Patient Active Problem List   Diagnosis Date Noted   Supervision of high risk pregnancy, antepartum 05/13/2019   AMA (advanced maternal age) multigravida 35+ 05/13/2019   Seizure disorder during pregnancy ([place]) 05/13/2019   Vaginal bleeding in pregnancy, third trimester 04/08/2017   Tobacco smoking complicating pregnancy 63/33/5456   History of intracranial aneurysm s/p coiling 11/05/2016   Obesity (BMI 30.0-34.9) 02/16/2014   Anxiety 03/10/2012    Past Surgical History:  Procedure Laterality Date   ANEURYSM COILING     BRAIN SURGERY     TONSILLECTOMY      OB History    Gravida  7   Para  4   Term  4   Preterm      AB  3   Living  4     SAB  1   TAB  1   Ectopic  0   Multiple  0   Live Births  4            Home Medications    Prior to Admission medications   Medication Sig Start Date End Date Taking? Authorizing  Provider  lamoTRIgine (LAMICTAL) 100 MG tablet Take 200 mg by mouth 2 (two) times daily. 03/11/17  Yes [provider]  phentermine (ADIPEX-P) 37.5 MG tablet Take 37.5 mg by mouth every morning. 12/14/19  Yes [provider]  Vitamin D, Ergocalciferol, (DRISDOL) 1.25 MG (50000 UT) CAPS capsule Take 50,000 Units by mouth once a week. 04/21/19  Yes [provider]  Blood Pressure Monitoring (BLOOD PRESSURE KIT) DEVI 1 Device by Does not apply route as needed. 05/13/19   Constant, Peggy, MD  doxycycline (VIBRAMYCIN) 100 MG capsule Take 1 capsule (100 mg total) by mouth 2 (two) times daily for 7 days. 03/05/20 03/12/20  Erik Burkett C, PA-C  folic acid (FOLVITE) 1 MG tablet Take 2 tablets (2 mg total) by mouth 2 (two) times daily. 05/13/19   Donnamae Jude, MD  metroNIDAZOLE (FLAGYL) 500 MG tablet Take 1 tablet (500 mg total) by mouth 2 (two) times daily for 7 days. 03/05/20 03/12/20  Kennethia Lynes C, PA-C  topiramate (TOPAMAX) 25 MG tablet Take 25 mg by mouth 2 (two) times daily.  09/06/11  [provider]    Family History Family History  Problem Relation Age of Onset  Thyroid disease Mother    Aortic aneurysm Mother        heart   Diabetes Mother    Aneurysm Mother    Hypertension Maternal Aunt    Hypertension Maternal Uncle    Hypertension Maternal Grandmother    Diabetes Maternal Grandmother    Hypertension Sister    Diabetes Sister    Asthma Daughter     Social History Social History   Tobacco Use   Smoking status: Current Some Day Smoker    Packs/day: 1.00    Types: Cigarettes   Smokeless tobacco: Never Used   Tobacco comment: 2 cigarettes  Vaping Use   Vaping Use: Never used  Substance Use Topics   Alcohol use: Not Currently   Drug use: No     Allergies   Hydroxacen [hydroxyzine] and Vicodin [hydrocodone-acetaminophen]   Review of Systems Review of Systems  Constitutional: Negative for fever.  Respiratory:  Negative for shortness of breath.   Cardiovascular: Negative for chest pain.  Gastrointestinal: Negative for abdominal pain, diarrhea, nausea and vomiting.  Genitourinary: Positive for vaginal discharge. Negative for dysuria, flank pain, genital sores, hematuria, menstrual problem, vaginal bleeding and vaginal pain.  Musculoskeletal: Negative for back pain.  Skin: Positive for wound. Negative for rash.  Neurological: Negative for dizziness, light-headedness and headaches.     Physical Exam Triage Vital Signs ED Triage Vitals  Enc Vitals Group     BP 03/05/20 1806 116/72     Pulse Rate 03/05/20 1806 87     Resp 03/05/20 1806 16     Temp 03/05/20 1806 98.7 F (37.1 C)     Temp Source 03/05/20 1806 Oral     SpO2 03/05/20 1806 99 %     Weight --      Height --      Head Circumference --      Peak Flow --      Pain Score 03/05/20 1807 2     Pain Loc --      Pain Edu? --      Excl. in [place]? --    No data found.  Updated Vital Signs BP 116/72    Pulse 87    Temp 98.7 F (37.1 C) (Oral)    Resp 16    LMP 02/05/2020 (Exact Date)    SpO2 99%    Breastfeeding No   Visual Acuity Right Eye Distance:   Left Eye Distance:   Bilateral Distance:    Right Eye Near:   Left Eye Near:    Bilateral Near:     Physical Exam Vitals and nursing note reviewed.  Constitutional:      Appearance: She is well-developed.     Comments: No acute distress  HENT:     Head: Normocephalic and atraumatic.     Nose: Nose normal.  Eyes:     Conjunctiva/sclera: Conjunctivae normal.  Cardiovascular:     Rate and Rhythm: Normal rate.  Pulmonary:     Effort: Pulmonary effort is normal. No respiratory distress.  Abdominal:     General: There is no distension.  Musculoskeletal:        General: Normal range of motion.     Cervical back: Neck supple.  Skin:    General: Skin is warm and dry.     Comments: Left axilla with fluctuant and erythematous abscess approximately 2 cm x 1.5 cm  Neurological:      Mental Status: She is alert and oriented to person, place, and time.  UC Treatments / Results  Labs (all labs ordered are listed, but only abnormal results are displayed) Labs Reviewed  POCT URINALYSIS DIPSTICK, ED / UC - Abnormal; Notable for the following components:      Result Value   Nitrite POSITIVE (*)    All other components within normal limits  URINE CULTURE  POC URINE PREG, ED  CERVICOVAGINAL ANCILLARY ONLY    EKG   Radiology No results found.  Procedures Incision and Drainage  Date/Time: 03/05/2020 7:14 PM Performed by: Amare Bail, [place] C, PA-C Authorized by: Silvia Hightower, Elesa Hacker, PA-C   Consent:    Consent obtained:  Verbal   Consent given by:  Patient   Risks discussed:  Pain and incomplete drainage   Alternatives discussed:  No treatment Location:    Type:  Abscess   Size:  2   Location: L axilla. Pre-procedure details:    Skin preparation:  Betadine Anesthesia (see MAR for exact dosages):    Anesthesia method:  Local infiltration   Local anesthetic:  Lidocaine 2% w/o epi Procedure type:    Complexity:  Simple Procedure details:    Incision types:  Single straight   Incision depth:  Subcutaneous   Scalpel blade:  11   Wound management:  Probed and deloculated   Drainage:  Purulent   Drainage amount:  Moderate   Wound treatment:  Wound left open   Packing materials:  None Post-procedure details:    Patient tolerance of procedure:  Tolerated well, no immediate complications   (including critical care time)  Medications Ordered in UC Medications - No data to display  Initial Impression / Assessment and Plan / UC Course  I have reviewed the triage vital signs and the nursing notes.  Pertinent labs & imaging results that were available during my care of the patient were reviewed by me and considered in my medical decision making (see chart for details).     1.  Left axilla abscess-I&D performed today, initiating doxycycline, warm  compresses, follow-up with surgery.  2.  Vaginal discharge-suspect BV, empirically treating for this.  Vaginal swab pending; call with results and alter therapy as needed.  Discussed strict return precautions. Patient verbalized understanding and is agreeable with plan.  Final Clinical Impressions(s) / UC Diagnoses   Final diagnoses:  Abscess of left axilla  Vaginal discharge  BV (bacterial vaginosis)     Discharge Instructions     Abscess Please begin doxycycline for 10 days  Apply warm compresses/hot rags to area with massage to express further drainage especially the first 24-48 hours  Return if symptoms returning or not improving Discharge Begin metronidazole twice daily for 1 week for bacterial vaginosis.   We are testing you for Gonorrhea, Chlamydia, Trichomonas, Yeast and Bacterial Vaginosis. We will call you if anything is positive and let you know if you require any further treatment. Please inform partners of any positive results.   Please return if symptoms not improving with treatment, development of fever, nausea, vomiting, abdominal pain.    ED Prescriptions    Medication Sig Dispense Auth. Provider   doxycycline (VIBRAMYCIN) 100 MG capsule Take 1 capsule (100 mg total) by mouth 2 (two) times daily for 7 days. 14 capsule Dayson Aboud C, PA-C   metroNIDAZOLE (FLAGYL) 500 MG tablet Take 1 tablet (500 mg total) by mouth 2 (two) times daily for 7 days. 14 tablet Sophya Vanblarcom, [place] C, PA-C     PDMP not reviewed this encounter.   Jaquita Bessire, [place] C, PA-C 03/06/20 1017

## 2020-03-07 LAB — CERVICOVAGINAL ANCILLARY ONLY
Bacterial Vaginitis (gardnerella): POSITIVE — AB
Candida Glabrata: NEGATIVE
Candida Vaginitis: NEGATIVE
Chlamydia: NEGATIVE
Comment: NEGATIVE
Comment: NEGATIVE
Comment: NEGATIVE
Comment: NEGATIVE
Comment: NEGATIVE
Comment: NORMAL
Neisseria Gonorrhea: NEGATIVE
Trichomonas: NEGATIVE

## 2020-03-09 ENCOUNTER — Telehealth (HOSPITAL_COMMUNITY): Payer: Self-pay | Admitting: Emergency Medicine

## 2020-03-09 LAB — URINE CULTURE: Culture: >=100000 — AB

## 2020-03-09 MED ORDER — NITROFURANTOIN MONOHYD MACRO 100 MG PO CAPS: 100.0000 mg | ORAL_CAPSULE | Freq: Two times a day (BID) | ORAL | 0 refills | Status: DC

## 2020-09-28 ENCOUNTER — Ambulatory Visit (HOSPITAL_COMMUNITY)
Admission: EM | Admit: 2020-09-28 | Discharge: 2020-09-28 | Disposition: A | Discharge status: Home / Self Care | Payer: Medicaid Other | Attending: Family Medicine | Admitting: Family Medicine

## 2020-09-28 ENCOUNTER — Other Ambulatory Visit: Payer: Self-pay

## 2020-09-28 ENCOUNTER — Encounter (HOSPITAL_COMMUNITY): Payer: Self-pay

## 2020-09-28 DIAGNOSIS — B9689 Other specified bacterial agents as the cause of diseases classified elsewhere: Secondary | ICD-10-CM | POA: Diagnosis not present

## 2020-09-28 DIAGNOSIS — Z3201 Encounter for pregnancy test, result positive: Secondary | ICD-10-CM | POA: Diagnosis not present

## 2020-09-28 DIAGNOSIS — N898 Other specified noninflammatory disorders of vagina: Secondary | ICD-10-CM | POA: Diagnosis not present

## 2020-09-28 DIAGNOSIS — S29019A Strain of muscle and tendon of unspecified wall of thorax, initial encounter: Secondary | ICD-10-CM | POA: Insufficient documentation

## 2020-09-28 DIAGNOSIS — N760 Acute vaginitis: Secondary | ICD-10-CM | POA: Diagnosis not present

## 2020-09-28 LAB — POCT URINALYSIS DIPSTICK, ED / UC
Bilirubin Urine: NEGATIVE
Glucose, UA: NEGATIVE mg/dL
Hgb urine dipstick: NEGATIVE
Ketones, ur: NEGATIVE mg/dL
Leukocytes,Ua: NEGATIVE
Nitrite: NEGATIVE
Protein, ur: NEGATIVE mg/dL
Specific Gravity, Urine: 1.025 (ref 1.005–1.030)
Urobilinogen, UA: 0.2 mg/dL (ref 0.0–1.0)
pH: 6 (ref 5.0–8.0)

## 2020-09-28 LAB — POC URINE PREG, ED: Preg Test, Ur: POSITIVE — AB

## 2020-09-28 MED ORDER — METRONIDAZOLE 500 MG PO TABS: 500.0000 mg | ORAL_TABLET | Freq: Two times a day (BID) | ORAL | 0 refills | Status: DC

## 2020-09-28 MED ORDER — METRONIDAZOLE 0.75 % VA GEL: 1.0000 | Freq: Two times a day (BID) | VAGINAL | 0 refills | Status: DC

## 2020-09-28 MED ORDER — NAPROXEN 500 MG PO TABS: 500.0000 mg | ORAL_TABLET | Freq: Two times a day (BID) | ORAL | 0 refills | Status: DC | PRN

## 2020-09-28 NOTE — ED Triage Notes (Signed)
Pt with upper/mid back pain and states having UTI symptoms. However pt denies urinary frequency, denies blood in urine but states has vaginal discharge which is creamy and has an odor.

## 2020-09-28 NOTE — ED Provider Notes (Signed)
[place]    CSN: [id number] Arrival date & time: 09/28/20  1313      History   Chief Complaint Chief Complaint  Patient presents with  . Back Pain  . Vaginal Discharge    HPI Hannah Cook is a 40 y.o. female.   Patient presenting today with several day history of white vaginal discharge, odor, bloating and lower abdomen.  She also states she started this morning with upper bilateral back soreness that seems to be after sleep on the couch with her daughter and then carrying her up the stairs.  She denies dysuria, hematuria, urinary frequency, fever, chills, nausea vomiting diarrhea.  No radiation of pain down arms from upper back pain.  Has not tried anything over-the-counter for either of these symptoms.  Notes a history of BV and UTIs and states this feels similar.  LMP 07/31/2020.     Past Medical History:  Diagnosis Date  . Aneurysm ([place]) 2002  . Asthma   . Hypothyroidism   . Panic attack   . Seizures ([place])    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  . Thyroid disease     Patient Active Problem List   Diagnosis Date Noted  . Supervision of high risk pregnancy, antepartum 05/13/2019  . AMA (advanced maternal age) multigravida 35+ 05/13/2019  . Seizure disorder during pregnancy ([place]) 05/13/2019  . Vaginal bleeding in pregnancy, third trimester 04/08/2017  . Tobacco smoking complicating pregnancy 21/30/8657  . History of intracranial aneurysm s/p coiling 11/05/2016  . Obesity (BMI 30.0-34.9) 02/16/2014  . Anxiety 03/10/2012    Past Surgical History:  Procedure Laterality Date  . ANEURYSM COILING    . BRAIN SURGERY    . TONSILLECTOMY      OB History    Gravida  7   Para  4   Term  4   Preterm      AB  3   Living  4     SAB  1   IAB  1   Ectopic  0   Multiple  0   Live Births  4            Home Medications    Prior to Admission medications   Medication Sig Start Date End Date Taking? Authorizing Provider   lamoTRIgine (LAMICTAL) 100 MG tablet Take 200 mg by mouth 2 (two) times daily. 03/11/17  Yes [provider]  metroNIDAZOLE (METROGEL VAGINAL) 0.75 % vaginal gel Place 1 Applicatorful vaginally 2 (two) times daily. 09/28/20  Yes Volney American, PA-C  Vitamin D, Ergocalciferol, (DRISDOL) 1.25 MG (50000 UT) CAPS capsule Take 50,000 Units by mouth once a week. 04/21/19  Yes [provider]  Blood Pressure Monitoring (BLOOD PRESSURE KIT) DEVI 1 Device by Does not apply route as needed. 05/13/19   Constant, Peggy, MD  folic acid (FOLVITE) 1 MG tablet Take 2 tablets (2 mg total) by mouth 2 (two) times daily. 05/13/19   Donnamae Jude, MD  nitrofurantoin, macrocrystal-monohydrate, (MACROBID) 100 MG capsule Take 1 capsule (100 mg total) by mouth 2 (two) times daily. 03/09/20   Chase Picket, MD  phentermine (ADIPEX-P) 37.5 MG tablet Take 37.5 mg by mouth every morning. Pt states she takes on as needed basis 12/14/19   [provider]  topiramate (TOPAMAX) 25 MG tablet Take 25 mg by mouth 2 (two) times daily.  09/06/11  [provider]    Family History Family History  Problem Relation Age of Onset  .  Thyroid disease Mother   . Aortic aneurysm Mother        heart  . Diabetes Mother   . Aneurysm Mother   . Hypertension Maternal Aunt   . Hypertension Maternal Uncle   . Hypertension Maternal Grandmother   . Diabetes Maternal Grandmother   . Hypertension Sister   . Diabetes Sister   . Asthma Daughter     Social History Social History   Tobacco Use  . Smoking status: Former Smoker    Packs/day: 1.00    Types: Cigarettes  . Smokeless tobacco: Never Used  . Tobacco comment: 2 cigarettes  Vaping Use  . Vaping Use: Never used  Substance Use Topics  . Alcohol use: Not Currently  . Drug use: No     Allergies   Hydroxacen [hydroxyzine] and Vicodin [hydrocodone-acetaminophen]   Review of Systems Review of Systems Per HPI  Physical Exam Triage  Vital Signs ED Triage Vitals  Enc Vitals Group     BP 09/28/20 1427 102/69     Pulse Rate 09/28/20 1427 93     Resp 09/28/20 1427 18     Temp 09/28/20 1427 99.1 F (37.3 C)     Temp src --      SpO2 09/28/20 1427 95 %     Weight --      Height --      Head Circumference --      Peak Flow --      Pain Score 09/28/20 1422 10     Pain Loc --      Pain Edu? --      Excl. in [place]? --    No data found.  Updated Vital Signs BP 102/69   Pulse 93   Temp 99.1 F (37.3 C)   Resp 18   LMP 07/31/2020   SpO2 95%   Visual Acuity Right Eye Distance:   Left Eye Distance:   Bilateral Distance:    Right Eye Near:   Left Eye Near:    Bilateral Near:     Physical Exam Vitals and nursing note reviewed.  Constitutional:      Appearance: Normal appearance. She is not ill-appearing.  HENT:     Head: Atraumatic.     Nose: Nose normal.     Mouth/Throat:     Mouth: Mucous membranes are moist.     Pharynx: Oropharynx is clear. No posterior oropharyngeal erythema.  Eyes:     Extraocular Movements: Extraocular movements intact.     Conjunctiva/sclera: Conjunctivae normal.  Cardiovascular:     Rate and Rhythm: Normal rate and regular rhythm.     Heart sounds: Normal heart sounds.  Pulmonary:     Effort: Pulmonary effort is normal.     Breath sounds: Normal breath sounds.  Abdominal:     General: Bowel sounds are normal. There is no distension.     Palpations: Abdomen is soft.     Tenderness: There is no abdominal tenderness. There is no right CVA tenderness, left CVA tenderness or guarding.  Genitourinary:    Comments: GU exam deferred, self swab performed Musculoskeletal:        General: Tenderness present. No swelling. Normal range of motion.     Cervical back: Normal range of motion and neck supple.     Comments: Bilateral thoracic musculature tender to palpation  Skin:    General: Skin is warm and dry.     Findings: No bruising or erythema.  Neurological:     Mental Status:  She is alert and oriented to person, place, and time.  Psychiatric:        Mood and Affect: Mood normal.        Thought Content: Thought content normal.        Judgment: Judgment normal.      UC Treatments / Results  Labs (all labs ordered are listed, but only abnormal results are displayed) Labs Reviewed  POC URINE PREG, ED - Abnormal; Notable for the following components:      Result Value   Preg Test, Ur POSITIVE (*)    All other components within normal limits  POCT URINALYSIS DIPSTICK, ED / UC  CERVICOVAGINAL ANCILLARY ONLY    EKG   Radiology No results found.  Procedures Procedures (including critical care time)  Medications Ordered in UC Medications - No data to display  Initial Impression / Assessment and Plan / UC Course  I have reviewed the triage vital signs and the nursing notes.  Pertinent labs & imaging results that were available during my care of the patient were reviewed by me and considered in my medical decision making (see chart for details).     Vital signs and exam reassuring today, urine pregnancy positive, urinalysis benign.  Vaginal swab pending.  Discussed positive pregnancy test, which was a shock to her.  Do suspect a BV infection given her symptoms and will start MetroGel while vaginal swab pending.  Follow-up with OB/GYN for recheck and initial OB visit. Tylenol as needed and heat, massage for her upper back soreness.  Final Clinical Impressions(s) / UC Diagnoses   Final diagnoses:  Vaginal discharge  Acute vaginitis  Thoracic myofascial strain, initial encounter  Positive pregnancy test   Discharge Instructions   None    ED Prescriptions    Medication Sig Dispense Auth. Provider   naproxen (NAPROSYN) 500 MG tablet  (Status: Discontinued) Take 1 tablet (500 mg total) by mouth 2 (two) times daily as needed. 30 tablet Volney American, [place]   metroNIDAZOLE (FLAGYL) 500 MG tablet  (Status: Discontinued) Take 1 tablet (500 mg  total) by mouth 2 (two) times daily. 14 tablet Volney American, [place]   metroNIDAZOLE (METROGEL VAGINAL) 0.75 % vaginal gel Place 1 Applicatorful vaginally 2 (two) times daily. 140 g Volney American, [place]     PDMP not reviewed this encounter.   Volney American, [place] 09/28/20 [phone]

## 2020-09-29 ENCOUNTER — Encounter (HOSPITAL_COMMUNITY): Payer: Self-pay

## 2020-09-29 ENCOUNTER — Inpatient Hospital Stay (HOSPITAL_COMMUNITY)
Admission: AD | Admit: 2020-09-29 | Discharge: 2020-09-29 | Disposition: A | Discharge status: Home / Self Care | Payer: Medicaid Other | Attending: Obstetrics & Gynecology | Admitting: Obstetrics & Gynecology

## 2020-09-29 ENCOUNTER — Other Ambulatory Visit: Payer: Self-pay

## 2020-09-29 ENCOUNTER — Ambulatory Visit (HOSPITAL_COMMUNITY)
Admission: EM | Admit: 2020-09-29 | Discharge: 2020-09-29 | Disposition: A | Discharge status: Home / Self Care | Payer: Medicaid Other

## 2020-09-29 ENCOUNTER — Encounter (HOSPITAL_COMMUNITY): Payer: Self-pay | Admitting: Obstetrics & Gynecology

## 2020-09-29 ENCOUNTER — Inpatient Hospital Stay (HOSPITAL_COMMUNITY): Payer: Medicaid Other

## 2020-09-29 DIAGNOSIS — O26891 Other specified pregnancy related conditions, first trimester: Secondary | ICD-10-CM | POA: Insufficient documentation

## 2020-09-29 DIAGNOSIS — H05232 Hemorrhage of left orbit: Secondary | ICD-10-CM

## 2020-09-29 DIAGNOSIS — O9A211 Injury, poisoning and certain other consequences of external causes complicating pregnancy, first trimester: Secondary | ICD-10-CM

## 2020-09-29 DIAGNOSIS — R109 Unspecified abdominal pain: Secondary | ICD-10-CM | POA: Insufficient documentation

## 2020-09-29 DIAGNOSIS — Z3A01 Less than 8 weeks gestation of pregnancy: Secondary | ICD-10-CM | POA: Diagnosis not present

## 2020-09-29 DIAGNOSIS — S161XXA Strain of muscle, fascia and tendon at neck level, initial encounter: Secondary | ICD-10-CM

## 2020-09-29 DIAGNOSIS — M25561 Pain in right knee: Secondary | ICD-10-CM | POA: Diagnosis not present

## 2020-09-29 DIAGNOSIS — O9A219 Injury, poisoning and certain other consequences of external causes complicating pregnancy, unspecified trimester: Secondary | ICD-10-CM

## 2020-09-29 DIAGNOSIS — O3680X0 Pregnancy with inconclusive fetal viability, not applicable or unspecified: Secondary | ICD-10-CM

## 2020-09-29 DIAGNOSIS — Z87891 Personal history of nicotine dependence: Secondary | ICD-10-CM | POA: Diagnosis not present

## 2020-09-29 DIAGNOSIS — O26899 Other specified pregnancy related conditions, unspecified trimester: Secondary | ICD-10-CM

## 2020-09-29 LAB — URINALYSIS, ROUTINE W REFLEX MICROSCOPIC
Bilirubin Urine: NEGATIVE
Glucose, UA: NEGATIVE mg/dL
Hgb urine dipstick: NEGATIVE
Ketones, ur: 5 mg/dL — AB
Leukocytes,Ua: NEGATIVE
Nitrite: NEGATIVE
Protein, ur: NEGATIVE mg/dL
Specific Gravity, Urine: 1.017 (ref 1.005–1.030)
pH: 6 (ref 5.0–8.0)

## 2020-09-29 LAB — COMPREHENSIVE METABOLIC PANEL
ALT: 19 U/L (ref 0–44)
AST: 21 U/L (ref 15–41)
Albumin: 3.6 g/dL (ref 3.5–5.0)
Alkaline Phosphatase: 71 U/L (ref 38–126)
Anion gap: 7 (ref 5–15)
BUN: 9 mg/dL (ref 6–20)
CO2: 25 mmol/L (ref 22–32)
Calcium: 9 mg/dL (ref 8.9–10.3)
Chloride: 106 mmol/L (ref 98–111)
Creatinine, Ser: 0.69 mg/dL (ref 0.44–1.00)
GFR, Estimated: >60 mL/min (ref >60)
Glucose, Bld: 91 mg/dL (ref 70–99)
Potassium: 3.9 mmol/L (ref 3.5–5.1)
Sodium: 138 mmol/L (ref 135–145)
Total Bilirubin: 0.6 mg/dL (ref 0.3–1.2)
Total Protein: 6.7 g/dL (ref 6.5–8.1)

## 2020-09-29 LAB — CBC
HCT: 40.2 % (ref 36.0–46.0)
Hemoglobin: 14 g/dL (ref 12.0–15.0)
MCH: 32.9 pg (ref 26.0–34.0)
MCHC: 34.8 g/dL (ref 30.0–36.0)
MCV: 94.6 fL (ref 80.0–100.0)
Platelets: 308 10*3/uL (ref 150–400)
RBC: 4.25 MIL/uL (ref 3.87–5.11)
RDW: 12.9 % (ref 11.5–15.5)
WBC: 5.3 10*3/uL (ref 4.0–10.5)
nRBC: 0 % (ref 0.0–0.2)

## 2020-09-29 LAB — TYPE AND SCREEN
ABO/RH(D): O POS
Antibody Screen: NEGATIVE

## 2020-09-29 LAB — CERVICOVAGINAL ANCILLARY ONLY
Bacterial Vaginitis (gardnerella): POSITIVE — AB
Candida Glabrata: NEGATIVE
Candida Vaginitis: NEGATIVE
Chlamydia: NEGATIVE
Comment: NEGATIVE
Comment: NEGATIVE
Comment: NEGATIVE
Comment: NEGATIVE
Comment: NEGATIVE
Comment: NORMAL
Neisseria Gonorrhea: NEGATIVE
Trichomonas: NEGATIVE

## 2020-09-29 LAB — HCG, QUANTITATIVE, PREGNANCY: hCG, Beta Chain, Quant, S: 657 m[IU]/mL — ABNORMAL HIGH (ref <5)
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
## 2020-09-29 NOTE — ED Triage Notes (Signed)
Pt learned yesterday that she was pregnant
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
## 2020-09-29 NOTE — Discharge Instructions (Signed)
Ectopic Pregnancy  An ectopic pregnancy happens when a fertilized egg grows outside of the womb (uterus). Fertilized means that sperm entered the egg. The egg cannot stay alive outside of the womb. What are the causes? The most common cause is damage to a fallopian tube. This damage stops the egg from getting to the womb. Instead, the egg stays in the tube. Sometimes, an ectopic pregnancy happens in other parts of the body. What increases the risk?  Getting treatment before to help you have a baby.  A past pregnancy outside of the womb.  A past surgery to have your tubes tied.  Getting pregnant while using a device in the womb to avoid getting pregnant.  Taking birth control pills before the age of 69.  Smoking or drinking alcohol.  Having a mother who took a medicine called DES many years ago. What are the signs or symptoms? Common symptoms of this condition include:  Missing a menstrual period.  Feeling like you may vomit.  Tiredness.  Breast pain.  Other signs that you are pregnant. Other symptoms may include:  Pain during sex.  Bleeding from the vagina.  Belly pain.  A fast heartbeat, low blood pressure, and sweating.  Pain or extra pressure while pooping (having a bowel movement). If your tube tears or bursts:  You may have sudden and very bad pain in your belly.  You may feel dizzy, weak, or light-headed.  You may faint.  You may have pain in your shoulder or neck. A torn or burst tube is an emergency. It can be life-threatening. How is this treated? This condition may be treated with:  Medicine. This may be given if: ? The pregnancy is found early and you are not bleeding. ? The tube has not torn or burst.  Surgery. This may be done to: ? Take out the pregnancy tissue. ? Stop bleeding. ? Take out part or all of the tube. ? Take out the womb. This is rare.  Blood tests.  Careful watching. Follow these instructions at home: Medicines  Take  over-the-counter and prescription medicines only as told by your doctor.  If told, take steps to prevent problems with pooping (constipation). You may need to: ? Drink enough fluid to keep your pee (urine) pale yellow. ? Take medicines. You will be told what medicines to take. ? Eat foods that are high in fiber. These include beans, whole grains, and fresh fruits and vegetables. ? Limit foods that are high in fat and sugar. These include fried or sweet foods.  Ask your doctor if you should avoid driving or using machines while you are taking your medicine. General instructions  Rest or limit your activities, if told to do this.  Do not have sex for 6 weeks or as told by your doctor.  Do not put tampons, vaginal cleaning wash (douche), or other things in your vagina. Do not use these things for 6 weeks or until your doctor says it is safe to use them.  Do not lift anything that is heavier than 10 lb (4.5 kg), or the limit that you are told.  Return to your normal activities when your doctor says that it is safe.  Keep all follow-up visits. Contact a doctor if:  You have a fever or chills.  You feel like you may vomit and you vomit. Get help right away if:  Your pain gets worse or is not helped by medicine.  You feel dizzy or weak.  You feel  light-headed.  You faint.  You have sudden and very bad pain in your belly.  You have very bad pain in your shoulder or neck. Summary  An ectopic pregnancy happens when a fertilized egg grows outside the womb.  This is an emergency.  The most common cause is damage to one of the fallopian tubes.  This condition may be treated with medicine, surgery, blood tests, or careful watching. This information is not intended to replace advice given to you by your health care provider. Make sure you discuss any questions you have with your health care provider. Document Revised: 09/25/2019 Document Reviewed: 09/15/2019 Elsevier Patient  Education  2021 Elsevier Inc.  

## 2020-10-04 ENCOUNTER — Other Ambulatory Visit: Payer: Self-pay

## 2020-10-04 ENCOUNTER — Inpatient Hospital Stay (HOSPITAL_COMMUNITY)
Admission: AD | Admit: 2020-10-04 | Discharge: 2020-10-04 | Disposition: A | Discharge status: Home / Self Care | Payer: Medicaid Other | Attending: Obstetrics and Gynecology | Admitting: Obstetrics and Gynecology

## 2020-10-04 DIAGNOSIS — I671 Cerebral aneurysm, nonruptured: Secondary | ICD-10-CM

## 2020-10-04 DIAGNOSIS — O26891 Other specified pregnancy related conditions, first trimester: Secondary | ICD-10-CM | POA: Insufficient documentation

## 2020-10-04 DIAGNOSIS — O3680X0 Pregnancy with inconclusive fetal viability, not applicable or unspecified: Secondary | ICD-10-CM

## 2020-10-04 DIAGNOSIS — Z3A01 Less than 8 weeks gestation of pregnancy: Secondary | ICD-10-CM | POA: Insufficient documentation

## 2020-10-04 DIAGNOSIS — O99350 Diseases of the nervous system complicating pregnancy, unspecified trimester: Secondary | ICD-10-CM | POA: Diagnosis not present

## 2020-10-04 DIAGNOSIS — G40909 Epilepsy, unspecified, not intractable, without status epilepticus: Secondary | ICD-10-CM | POA: Diagnosis not present

## 2020-10-04 DIAGNOSIS — O09529 Supervision of elderly multigravida, unspecified trimester: Secondary | ICD-10-CM

## 2020-10-04 DIAGNOSIS — O99419 Diseases of the circulatory system complicating pregnancy, unspecified trimester: Secondary | ICD-10-CM

## 2020-10-04 LAB — HCG, QUANTITATIVE, PREGNANCY: hCG, Beta Chain, Quant, S: 8155 m[IU]/mL — ABNORMAL HIGH (ref <5)

## 2020-10-04 NOTE — Discharge Instructions (Signed)
Abdominal Pain During Pregnancy Belly (abdominal) pain is common during pregnancy. There are many possible causes. Some causes are more serious than others. Sometimes the cause is not known. Always tell your doctor if you have belly pain. Follow these instructions at home:  Do not have sex or put anything in your vagina until your pain goes away completely.  Get plenty of rest until your pain gets better.  Drink enough fluid to keep your pee (urine) pale yellow.  Take over-the-counter and prescription medicines only as told by your doctor.  Keep all follow-up visits.   Contact a doctor if:  You keep having pain after resting.  Your pain gets worse after resting.  You have lower belly pain that: ? Comes and goes at regular times. ? Spreads to your back. ? Feels like menstrual cramps.  You have pain or burning when you pee (urinate). Get help right away if:  You have a fever or chills.  You feel like it is hard to breathe.  You have bleeding from your vagina.  You are leaking fluid or tissue from your vagina.  You vomit for more than 24 hours.  You have watery poop (diarrhea) for more than 24 hours.  Your baby is moving less than usual.  You feel very weak or faint.  You have very bad pain in your upper belly. Summary  Belly pain is common during pregnancy. There are many possible causes.  If you have belly pain during pregnancy, tell your doctor right away.  Keep all follow-up visits. This information is not intended to replace advice given to you by your health care provider. Make sure you discuss any questions you have with your health care provider. Document Revised: 02/16/2020 Document Reviewed: 02/16/2020 Elsevier Patient Education  2021 Elsevier Inc.  

## 2020-10-04 NOTE — MAU Note (Signed)
Hannah Cook is a 40 y.o. at [redacted]w[redacted]d here in MAU reporting: here for follow up hcg. Was supposed to come over the weekend but was unable to. Denies pain or bleeding.  Onset of complaint: ongoing  Pain score: 0/10  Vitals:   10/04/20 1246  BP: 110/71  Pulse: 89  Resp: 16  Temp: 98.5 F (36.9 C)  SpO2: 98%     Lab orders placed from triage: hcg
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
## 2020-10-12 ENCOUNTER — Ambulatory Visit: Admission: RE | Admit: 2020-10-12 | Payer: Medicaid Other | Source: Ambulatory Visit

## 2020-10-14 ENCOUNTER — Other Ambulatory Visit: Payer: Self-pay

## 2020-10-14 ENCOUNTER — Encounter (HOSPITAL_COMMUNITY): Payer: Self-pay

## 2020-10-14 ENCOUNTER — Ambulatory Visit (HOSPITAL_COMMUNITY)
Admission: EM | Admit: 2020-10-14 | Discharge: 2020-10-14 | Disposition: A | Discharge status: Home / Self Care | Payer: Medicaid Other | Attending: Emergency Medicine | Admitting: Emergency Medicine

## 2020-10-14 DIAGNOSIS — N898 Other specified noninflammatory disorders of vagina: Secondary | ICD-10-CM | POA: Insufficient documentation

## 2020-10-14 DIAGNOSIS — Z3A00 Weeks of gestation of pregnancy not specified: Secondary | ICD-10-CM

## 2020-10-14 DIAGNOSIS — O26891 Other specified pregnancy related conditions, first trimester: Secondary | ICD-10-CM | POA: Insufficient documentation

## 2020-10-14 NOTE — ED Triage Notes (Signed)
Patient presents to Urgent Care for follow-up. She reports being seen on 04/14 treated for BV. She states now she is experiencing continued discharge with a strong odor.   Denies fever, abdominal pain, or urinary symptoms.

## 2020-10-14 NOTE — ED Provider Notes (Signed)
[facility]    CSN: [id number] Arrival date & time: 10/14/20  0801      History   Chief Complaint Chief Complaint  Patient presents with  . Vaginitis    HPI Hannah Cook is a 40 y.o. female.   Hannah Cook presents with complaints of vaginal discharge which started around 4/13. She was seen here and tested positive for BV, also noted to be pregnant. She has since been to MAU and had postiive serial hcg's, and is to go in the next few days for repeat ultrasound. She is in her first trimester per her [place] on 4/14, she reports abnormal periods, however. No vaginal bleeding. No pelvic pain. No urinary symptoms. Vaginal discharge with some odor. No new sexual encounters since last visit. No vaginal itching. She complete course of metrogel which was prescribed 4/13.    ROS per HPI, negative if not otherwise mentioned.      Past Medical History:  Diagnosis Date  . Aneurysm ([place]) 2002  . Asthma   . Hypothyroidism   . Panic attack   . Seizures ([place])    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  . Thyroid disease     Patient Active Problem List   Diagnosis Date Noted  . Supervision of high risk pregnancy, antepartum 05/13/2019  . AMA (advanced maternal age) multigravida 35+ 05/13/2019  . Seizure disorder during pregnancy ([place]) 05/13/2019  . Vaginal bleeding in pregnancy, third trimester 04/08/2017  . Tobacco smoking complicating pregnancy 12/12/9483  . History of intracranial aneurysm s/p coiling 11/05/2016  . Obesity (BMI 30.0-34.9) 02/16/2014  . Anxiety 03/10/2012    Past Surgical History:  Procedure Laterality Date  . ANEURYSM COILING    . BRAIN SURGERY    . TONSILLECTOMY      OB History    Gravida  9   Para  4   Term  4   Preterm      AB  4   Living  4     SAB  1   IAB  2   Ectopic  0   Multiple  0   Live Births  4            Home Medications    Prior to Admission medications   Medication Sig Start Date End Date  Taking? Authorizing Provider  Blood Pressure Monitoring (BLOOD PRESSURE KIT) DEVI 1 Device by Does not apply route as needed. 05/13/19   Constant, Peggy, MD  lamoTRIgine (LAMICTAL) 100 MG tablet Take 200 mg by mouth 2 (two) times daily. 03/11/17   [provider]  metroNIDAZOLE (METROGEL VAGINAL) 0.75 % vaginal gel Place 1 Applicatorful vaginally 2 (two) times daily. 09/28/20   Volney American, PA-C  phentermine (ADIPEX-P) 37.5 MG tablet Take 37.5 mg by mouth every morning. Pt states she takes on as needed basis 12/14/19 09/29/20  [provider]  topiramate (TOPAMAX) 25 MG tablet Take 25 mg by mouth 2 (two) times daily.  09/06/11  [provider]    Family History Family History  Problem Relation Age of Onset  . Thyroid disease Mother   . Aortic aneurysm Mother        heart  . Aneurysm Mother   . Hypertension Maternal Aunt   . Hypertension Maternal Uncle   . Hypertension Maternal Grandmother   . Diabetes Maternal Grandmother   . Hypertension Sister   . Diabetes Sister   . Asthma Daughter     Social History Social  History   Tobacco Use  . Smoking status: Former Smoker    Packs/day: 1.00    Types: Cigarettes  . Smokeless tobacco: Never Used  . Tobacco comment: 2 cigarettes  Vaping Use  . Vaping Use: Never used  Substance Use Topics  . Alcohol use: Not Currently  . Drug use: No     Allergies   Hydroxacen [hydroxyzine] and Vicodin [hydrocodone-acetaminophen]   Review of Systems Review of Systems   Physical Exam Triage Vital Signs ED Triage Vitals  Enc Vitals Group     BP 10/14/20 0818 107/73     Pulse Rate 10/14/20 0818 80     Resp 10/14/20 0818 16     Temp 10/14/20 0818 98.7 F (37.1 C)     Temp Source 10/14/20 0818 Oral     SpO2 10/14/20 0818 98 %     Weight --      Height --      Head Circumference --      Peak Flow --      Pain Score 10/14/20 0816 0     Pain Loc --      Pain Edu? --      Excl. in [place]? --    No data  found.  Updated Vital Signs BP 107/73 (BP Location: Right Arm)   Pulse 80   Temp 98.7 F (37.1 C) (Oral)   Resp 16   LMP 07/31/2020   SpO2 98%   Visual Acuity Right Eye Distance:   Left Eye Distance:   Bilateral Distance:    Right Eye Near:   Left Eye Near:    Bilateral Near:     Physical Exam Constitutional:      General: She is not in acute distress.    Appearance: She is well-developed.  Cardiovascular:     Rate and Rhythm: Normal rate.  Pulmonary:     Effort: Pulmonary effort is normal.  Abdominal:     Palpations: Abdomen is not rigid.     Tenderness: There is no abdominal tenderness. There is no guarding or rebound.  Genitourinary:    Comments: Denies sores, lesions, vaginal bleeding; no pelvic pain; gu exam deferred at this time, vaginal self swab collected.   Skin:    General: Skin is warm and dry.  Neurological:     Mental Status: She is alert and oriented to person, place, and time.      UC Treatments / Results  Labs (all labs ordered are listed, but only abnormal results are displayed) Labs Reviewed  CERVICOVAGINAL ANCILLARY ONLY    EKG   Radiology No results found.  Procedures Procedures (including critical care time)  Medications Ordered in UC Medications - No data to display  Initial Impression / Assessment and Plan / UC Course  I have reviewed the triage vital signs and the nursing notes.  Pertinent labs & imaging results that were available during my care of the patient were reviewed by me and considered in my medical decision making (see chart for details).     Vaginal discharge, early pregnancy. Recent treatment with metro gel. Will await final cytology to initiate treatment. Return precautions provided. Encouraged establish with OB. Patient verbalized understanding and agreeable to plan.   Final Clinical Impressions(s) / UC Diagnoses   Final diagnoses:  Vaginal discharge during pregnancy in first trimester     Discharge  Instructions     We will notify of you any positive findings or if any changes to treatment are needed. If  normal or otherwise without concern to your results, we will not call you. Please log on to your MyChart to review your results if interested in so.   If you develop abdominal or pelvic pain, or vaginal bleeding please go to the women's hospital.  Please call to establish with an OB.    ED Prescriptions    None     PDMP not reviewed this encounter.   Zigmund Gottron, NP 10/14/20 [phone]

## 2020-10-14 NOTE — Discharge Instructions (Signed)
We will notify of you any positive findings or if any changes to treatment are needed. If normal or otherwise without concern to your results, we will not call you. Please log on to your MyChart to review your results if interested in so.   If you develop abdominal or pelvic pain, or vaginal bleeding please go to the women's hospital.  Please call to establish with an OB.

## 2020-10-17 LAB — CERVICOVAGINAL ANCILLARY ONLY
Bacterial Vaginitis (gardnerella): NEGATIVE
Candida Glabrata: NEGATIVE
Candida Vaginitis: NEGATIVE
Chlamydia: NEGATIVE
Comment: NEGATIVE
Comment: NEGATIVE
Comment: NEGATIVE
Comment: NEGATIVE
Comment: NEGATIVE
Comment: NORMAL
Neisseria Gonorrhea: NEGATIVE
Trichomonas: NEGATIVE

## 2020-10-31 DIAGNOSIS — G4489 Other headache syndrome: Secondary | ICD-10-CM | POA: Insufficient documentation

## 2020-11-02 ENCOUNTER — Inpatient Hospital Stay (HOSPITAL_COMMUNITY)
Admission: AD | Admit: 2020-11-02 | Discharge: 2020-11-02 | Disposition: A | Discharge status: Home / Self Care | Payer: Medicaid Other | Attending: Obstetrics & Gynecology | Admitting: Obstetrics & Gynecology

## 2020-11-02 ENCOUNTER — Encounter (HOSPITAL_COMMUNITY): Payer: Self-pay | Admitting: Obstetrics & Gynecology

## 2020-11-02 ENCOUNTER — Other Ambulatory Visit: Payer: Self-pay

## 2020-11-02 ENCOUNTER — Inpatient Hospital Stay (HOSPITAL_COMMUNITY): Payer: Medicaid Other

## 2020-11-02 DIAGNOSIS — O26899 Other specified pregnancy related conditions, unspecified trimester: Secondary | ICD-10-CM

## 2020-11-02 DIAGNOSIS — Z3A09 9 weeks gestation of pregnancy: Secondary | ICD-10-CM | POA: Diagnosis not present

## 2020-11-02 DIAGNOSIS — Z87891 Personal history of nicotine dependence: Secondary | ICD-10-CM | POA: Diagnosis not present

## 2020-11-02 DIAGNOSIS — R109 Unspecified abdominal pain: Secondary | ICD-10-CM | POA: Insufficient documentation

## 2020-11-02 DIAGNOSIS — O09521 Supervision of elderly multigravida, first trimester: Secondary | ICD-10-CM | POA: Diagnosis not present

## 2020-11-02 DIAGNOSIS — Z3491 Encounter for supervision of normal pregnancy, unspecified, first trimester: Secondary | ICD-10-CM

## 2020-11-02 DIAGNOSIS — O26891 Other specified pregnancy related conditions, first trimester: Secondary | ICD-10-CM | POA: Insufficient documentation

## 2020-11-02 LAB — URINALYSIS, ROUTINE W REFLEX MICROSCOPIC
Bilirubin Urine: NEGATIVE
Glucose, UA: NEGATIVE mg/dL
Hgb urine dipstick: NEGATIVE
Ketones, ur: NEGATIVE mg/dL
Leukocytes,Ua: NEGATIVE
Nitrite: NEGATIVE
Protein, ur: NEGATIVE mg/dL
Specific Gravity, Urine: 1.016 (ref 1.005–1.030)
pH: 6 (ref 5.0–8.0)

## 2020-11-02 NOTE — Discharge Instructions (Signed)
Return to care   If you have heavier bleeding that soaks through more that 2 pads per hour for an hour or more  If you bleed so much that you feel like you might pass out or you do pass out  If you have significant abdominal pain that is not improved with Tylenol       Safe Medications in Pregnancy   Acne: Benzoyl Peroxide Salicylic Acid  Backache/Headache: Tylenol: 2 regular strength every 4 hours OR              2 Extra strength every 6 hours  Colds/Coughs/Allergies: Benadryl (alcohol free) 25 mg every 6 hours as needed Breath right strips Claritin Cepacol throat lozenges Chloraseptic throat spray Cold-Eeze- up to three times per day Cough drops, alcohol free Flonase (by prescription only) Guaifenesin Mucinex Robitussin DM (plain only, alcohol free) Saline nasal spray/drops Sudafed (pseudoephedrine) & Actifed ** use only after [redacted] weeks gestation and if you do not have high blood pressure Tylenol Vicks Vaporub Zinc lozenges Zyrtec   Constipation: Colace Ducolax suppositories Fleet enema Glycerin suppositories Metamucil Milk of magnesia Miralax Senokot Smooth move tea  Diarrhea: Kaopectate Imodium A-D  *NO pepto Bismol  Hemorrhoids: Anusol Anusol HC Preparation H Tucks  Indigestion: Tums Maalox Mylanta Zantac  Pepcid  Insomnia: Benadryl (alcohol free) 25mg every 6 hours as needed Tylenol PM Unisom, no Gelcaps  Leg Cramps: Tums MagGel  Nausea/Vomiting:  Bonine Dramamine Emetrol Ginger extract Sea bands Meclizine  Nausea medication to take during pregnancy:  Unisom (doxylamine succinate 25 mg tablets) Take one tablet daily at bedtime. If symptoms are not adequately controlled, the dose can be increased to a maximum recommended dose of two tablets daily (1/2 tablet in the morning, 1/2 tablet mid-afternoon and one at bedtime). Vitamin B6 100mg tablets. Take one tablet twice a day (up to 200 mg per day).  Skin Rashes: Aveeno  products Benadryl cream or 25mg every 6 hours as needed Calamine Lotion 1% cortisone cream  Yeast infection: Gyne-lotrimin 7 Monistat 7  Gum/tooth pain: Anbesol  **If taking multiple medications, please check labels to avoid duplicating the same active ingredients **take medication as directed on the label ** Do not exceed 4000 mg of tylenol in 24 hours **Do not take medications that contain aspirin or ibuprofen     [place] [facility] Providers          [facility]  [address], [place], [place] [address]  [phone]  [facility]  [address], [place], [place] [address]  [phone]  [facility]  [address], [place], [place] [address]  [phone]  [facility] [place]  [address], [place], [place] [address]  [phone]  [facility]  [address]First floor), [place], [place] [address]  [phone]  [facility] [address], [place], [place] [address] [phone]  [facility]  [address], [place], [place] [address]  [phone]  [facility]  [address], [place], [place] [address]  [phone]  [facility]  [address], [place], [place] [address]  [phone]  [facility]  [address], [place], [place] [address]  [phone]  [facility]  [address], [place], [place] [address]  [phone]   [facility]    [address], [place], [place] [address]  [phone]  [facility]   [address], [place], [place] [address]  [phone]  [facility]  [address], [place], [place] [address]   [phone]  [facility]  [address], [place], [place] [address]  [phone]            

## 2020-11-02 NOTE — MAU Provider Note (Signed)
History     CSN: [id number]  Arrival date and time: 11/02/20 4166   Event Date/Time   First Provider Initiated Contact with Patient 11/02/20 [phone]      Chief Complaint  Patient presents with  . Abdominal Pain   HPI Hannah Cook is a 40 y.o. [id number] at [redacted]w[redacted]d who presents with abdominal cramping. Symptoms started last night. Reports lower mid to right abdominal cramping that is intermittent. Rates pain 6/10. Hasn't treated symptoms. Nothing makes better or worse. Denies fever, n/v/d, dysuria, vaginal bleeding, or vaginal discharge.   OB History    Gravida  9   Para  4   Term  4   Preterm      AB  4   Living  4     SAB  1   IAB  2   Ectopic  0   Multiple  0   Live Births  4           Past Medical History:  Diagnosis Date  . Aneurysm (HCC) 2002  . Asthma   . Hypothyroidism   . Panic attack   . 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 Thyroid disease     Past Surgical History:  Procedure Laterality Date  . ANEURYSM COILING    . BRAIN SURGERY    . TONSILLECTOMY      Family History  Problem Relation Age of Onset  . Thyroid disease Mother   . Aortic aneurysm Mother        heart  . Aneurysm Mother   . Hypertension Maternal Aunt   . Hypertension Maternal Uncle   . Hypertension Maternal Grandmother   . Diabetes Maternal Grandmother   . Hypertension Sister   . Diabetes Sister   . Asthma Daughter     Social History   Tobacco Use  . Smoking status: Former Smoker    Packs/day: 1.00    Types: Cigarettes  . Smokeless tobacco: Never Used  . Tobacco comment: 2 cigarettes  Vaping Use  . Vaping Use: Never used  Substance Use Topics  . Alcohol use: Not Currently  . Drug use: No    Allergies:  Allergies  Allergen Reactions  . Hydroxacen [Hydroxyzine] Itching and Nausea And Vomiting  . Vicodin [Hydrocodone-Acetaminophen] Itching, Nausea And Vomiting and Other (See Comments)    headaches    No medications prior  to admission.    Review of Systems  Constitutional: Negative.   Gastrointestinal: Positive for abdominal pain. Negative for diarrhea, nausea and vomiting.  Genitourinary: Negative.    Physical Exam   Blood pressure 105/63, pulse 81, resp. rate 18, height 5\' 6"  (1.676 m), weight 86.5 kg, last menstrual period 07/31/2020, SpO2 99 %.  Physical Exam Vitals and nursing note reviewed.  Constitutional:      General: She is not in acute distress.    Appearance: She is well-developed.  HENT:     Head: Normocephalic and atraumatic.  Pulmonary:     Effort: Pulmonary effort is normal. No respiratory distress.  Abdominal:     General: There is no distension.     Palpations: Abdomen is soft.     Tenderness: There is no abdominal tenderness. There is no guarding.  Skin:    General: Skin is warm and dry.  Neurological:     Mental Status: She is alert.  Psychiatric:        Mood and Affect: Mood normal.  Behavior: Behavior normal.     MAU Course  Procedures Results for orders placed or performed during the hospital encounter of 11/02/20 (from the past 24 hour(s))  Urinalysis, Routine w reflex microscopic Urine, Clean Catch     Status: None   Collection Time: 11/02/20  9:36 AM  Result Value Ref Range   Color, Urine YELLOW YELLOW   APPearance CLEAR CLEAR   Specific Gravity, Urine 1.016 1.005 - 1.030   pH 6.0 5.0 - 8.0   Glucose, UA NEGATIVE NEGATIVE mg/dL   Hgb urine dipstick NEGATIVE NEGATIVE   Bilirubin Urine NEGATIVE NEGATIVE   Ketones, ur NEGATIVE NEGATIVE mg/dL   Protein, ur NEGATIVE NEGATIVE mg/dL   Nitrite NEGATIVE NEGATIVE   Leukocytes,Ua NEGATIVE NEGATIVE    US OB Comp Less 14 Wks  Result Date: 11/02/2020 CLINICAL DATA:  Lower abdominal cramping. EXAM: OBSTETRIC <14 WK ULTRASOUND TECHNIQUE: Transabdominal ultrasound was performed for evaluation of the gestation as well as the maternal uterus and adnexal regions. COMPARISON:  September 29, 2020. FINDINGS: Intrauterine  gestational sac: Single Yolk sac:  Visualized. Embryo:  Visualized. Cardiac Activity: Visualized. Heart Rate: 180 bpm CRL:   25.7 mm   9 w 2 d                  [facility]: June 05, 2021. Subchorionic hemorrhage:  None visualized. Maternal uterus/adnexae: Ovaries are not visualized. No free fluid is noted. IMPRESSION: Single live intrauterine gestation of 9 weeks 2 days. Electronically Signed   By: Lupita Raider M.D.   On: 11/02/2020 10:26    MDM Patient had pregnancy of unknown location diagnosed last month & did not keep follow up - presents today with new onset abdominal pain. Declines medication as she reportedly does not like to take meds.  U/a today is negative. Ultrasound shows live IUP measuring [redacted]w[redacted]d (EDD updated) with no adnexal masses or free fluid. She has a benign abdominal exam & vital signs are normal.  Vaginal swabs were negative 2 weeks ago & she declines need for retesting.   Pain down to 3/10 without intervention & patient is reassured by today's results.   Assessment and Plan   1. Abdominal cramping affecting pregnancy   2. Normal IUP (intrauterine pregnancy) on prenatal ultrasound, first trimester   3. [redacted] weeks gestation of pregnancy    -taken tylenol prn pain -reviewed reasons to return to MAU -start prenatal care asap - given list of ob providers  Judeth Horn 11/02/2020, 10:50 AM

## 2020-11-02 NOTE — MAU Note (Signed)
Presents with c/o lower abdominal cramping that began last night.  Denies VB.

## 2021-01-31 ENCOUNTER — Emergency Department (HOSPITAL_COMMUNITY)
Admission: EM | Admit: 2021-01-31 | Discharge: 2021-01-31 | Disposition: A | Discharge status: Home / Self Care | Payer: Medicaid Other | Source: Home / Self Care | Attending: Emergency Medicine | Admitting: Emergency Medicine

## 2021-01-31 ENCOUNTER — Emergency Department (HOSPITAL_COMMUNITY)
Admission: EM | Admit: 2021-01-31 | Discharge: 2021-01-31 | Disposition: A | Discharge status: Home / Self Care | Payer: Medicaid Other | Attending: Student | Admitting: Student

## 2021-01-31 ENCOUNTER — Encounter (HOSPITAL_COMMUNITY): Payer: Self-pay

## 2021-01-31 ENCOUNTER — Encounter (HOSPITAL_COMMUNITY): Payer: Self-pay | Admitting: Emergency Medicine

## 2021-01-31 ENCOUNTER — Emergency Department (HOSPITAL_COMMUNITY): Payer: Medicaid Other

## 2021-01-31 ENCOUNTER — Other Ambulatory Visit: Payer: Self-pay

## 2021-01-31 ENCOUNTER — Emergency Department (HOSPITAL_COMMUNITY)
Admission: EM | Admit: 2021-01-31 | Discharge: 2021-01-31 | Disposition: A | Discharge status: Home / Self Care | Payer: Medicaid Other | Source: Home / Self Care | Attending: Student | Admitting: Student

## 2021-01-31 DIAGNOSIS — Z87891 Personal history of nicotine dependence: Secondary | ICD-10-CM | POA: Insufficient documentation

## 2021-01-31 DIAGNOSIS — J45909 Unspecified asthma, uncomplicated: Secondary | ICD-10-CM | POA: Insufficient documentation

## 2021-01-31 DIAGNOSIS — N9489 Other specified conditions associated with female genital organs and menstrual cycle: Secondary | ICD-10-CM | POA: Insufficient documentation

## 2021-01-31 DIAGNOSIS — E039 Hypothyroidism, unspecified: Secondary | ICD-10-CM | POA: Insufficient documentation

## 2021-01-31 DIAGNOSIS — R300 Dysuria: Secondary | ICD-10-CM | POA: Insufficient documentation

## 2021-01-31 DIAGNOSIS — Z79899 Other long term (current) drug therapy: Secondary | ICD-10-CM | POA: Insufficient documentation

## 2021-01-31 DIAGNOSIS — R42 Dizziness and giddiness: Secondary | ICD-10-CM | POA: Insufficient documentation

## 2021-01-31 DIAGNOSIS — R519 Headache, unspecified: Secondary | ICD-10-CM | POA: Diagnosis not present

## 2021-01-31 DIAGNOSIS — N390 Urinary tract infection, site not specified: Secondary | ICD-10-CM

## 2021-01-31 LAB — COMPREHENSIVE METABOLIC PANEL
ALT: 16 U/L (ref 0–44)
AST: 24 U/L (ref 15–41)
Albumin: 3.8 g/dL (ref 3.5–5.0)
Alkaline Phosphatase: 87 U/L (ref 38–126)
Anion gap: 8 (ref 5–15)
BUN: 12 mg/dL (ref 6–20)
CO2: 24 mmol/L (ref 22–32)
Calcium: 8.9 mg/dL (ref 8.9–10.3)
Chloride: 106 mmol/L (ref 98–111)
Creatinine, Ser: 0.9 mg/dL (ref 0.44–1.00)
GFR, Estimated: >60 mL/min (ref >60)
Glucose, Bld: 111 mg/dL — ABNORMAL HIGH (ref 70–99)
Potassium: 3.5 mmol/L (ref 3.5–5.1)
Sodium: 138 mmol/L (ref 135–145)
Total Bilirubin: 0.6 mg/dL (ref 0.3–1.2)
Total Protein: 7 g/dL (ref 6.5–8.1)

## 2021-01-31 LAB — CBC WITH DIFFERENTIAL/PLATELET
Abs Immature Granulocytes: 0.01 10*3/uL (ref 0.00–0.07)
Basophils Absolute: 0 10*3/uL (ref 0.0–0.1)
Basophils Relative: 1 %
Eosinophils Absolute: 0 10*3/uL (ref 0.0–0.5)
Eosinophils Relative: 1 %
HCT: 41.8 % (ref 36.0–46.0)
Hemoglobin: 14.3 g/dL (ref 12.0–15.0)
Immature Granulocytes: 0 %
Lymphocytes Relative: 50 %
Lymphs Abs: 3 10*3/uL (ref 0.7–4.0)
MCH: 32.9 pg (ref 26.0–34.0)
MCHC: 34.2 g/dL (ref 30.0–36.0)
MCV: 96.3 fL (ref 80.0–100.0)
Monocytes Absolute: 0.4 10*3/uL (ref 0.1–1.0)
Monocytes Relative: 7 %
Neutro Abs: 2.4 10*3/uL (ref 1.7–7.7)
Neutrophils Relative %: 41 %
Platelets: 277 10*3/uL (ref 150–400)
RBC: 4.34 MIL/uL (ref 3.87–5.11)
RDW: 12.7 % (ref 11.5–15.5)
WBC: 5.8 10*3/uL (ref 4.0–10.5)
nRBC: 0 % (ref 0.0–0.2)

## 2021-01-31 LAB — I-STAT CHEM 8, ED
BUN: 9 mg/dL (ref 6–20)
Calcium, Ion: 1.22 mmol/L (ref 1.15–1.40)
Chloride: 105 mmol/L (ref 98–111)
Creatinine, Ser: 0.8 mg/dL (ref 0.44–1.00)
Glucose, Bld: 78 mg/dL (ref 70–99)
HCT: 43 % (ref 36.0–46.0)
Hemoglobin: 14.6 g/dL (ref 12.0–15.0)
Potassium: 3.7 mmol/L (ref 3.5–5.1)
Sodium: 141 mmol/L (ref 135–145)
TCO2: 26 mmol/L (ref 22–32)

## 2021-01-31 LAB — I-STAT BETA HCG BLOOD, ED (MC, WL, AP ONLY)
I-stat hCG, quantitative: <5 m[IU]/mL (ref <5)
I-stat hCG, quantitative: <5 m[IU]/mL (ref <5)

## 2021-01-31 LAB — URINALYSIS, ROUTINE W REFLEX MICROSCOPIC
Bilirubin Urine: NEGATIVE
Glucose, UA: NEGATIVE mg/dL
Ketones, ur: 5 mg/dL — AB
Nitrite: POSITIVE — AB
Protein, ur: NEGATIVE mg/dL
Specific Gravity, Urine: 1.029 (ref 1.005–1.030)
pH: 5 (ref 5.0–8.0)

## 2021-01-31 MED ORDER — CEFADROXIL 500 MG PO CAPS: 500.0000 mg | ORAL_CAPSULE | Freq: Two times a day (BID) | ORAL | 0 refills | Status: AC

## 2021-01-31 MED ORDER — PROCHLORPERAZINE EDISYLATE 10 MG/2ML IJ SOLN
10.0000 mg | Freq: Once | INTRAMUSCULAR | Status: AC
  Administered 2021-01-31: 10 mg via INTRAVENOUS
  Filled 2021-01-31: qty 2

## 2021-01-31 MED ORDER — MECLIZINE HCL 25 MG PO TABS: 25.0000 mg | ORAL_TABLET | Freq: Three times a day (TID) | ORAL | 0 refills | Status: DC | PRN

## 2021-01-31 MED ORDER — MECLIZINE HCL 25 MG PO TABS
25.0000 mg | ORAL_TABLET | Freq: Once | ORAL | Status: AC
  Administered 2021-01-31: 25 mg via ORAL
  Filled 2021-01-31: qty 1

## 2021-01-31 MED ORDER — MECLIZINE HCL 25 MG PO TABS: 25.0000 mg | ORAL_TABLET | Freq: Once | ORAL | Status: DC

## 2021-01-31 MED ORDER — IOHEXOL 350 MG/ML SOLN
100.0000 mL | Freq: Once | INTRAVENOUS | Status: AC | PRN
  Administered 2021-01-31: 75 mL via INTRAVENOUS

## 2021-01-31 MED ORDER — LACTATED RINGERS IV BOLUS
1000.0000 mL | Freq: Once | INTRAVENOUS | Status: AC
  Administered 2021-01-31: 1000 mL via INTRAVENOUS

## 2021-01-31 MED ORDER — DIPHENHYDRAMINE HCL 50 MG/ML IJ SOLN
25.0000 mg | Freq: Once | INTRAMUSCULAR | Status: AC
  Administered 2021-01-31: 25 mg via INTRAVENOUS
  Filled 2021-01-31: qty 1

## 2021-01-31 MED ORDER — LORAZEPAM 1 MG PO TABS
1.0000 mg | ORAL_TABLET | Freq: Once | ORAL | Status: AC
  Administered 2021-01-31: 1 mg via ORAL
  Filled 2021-01-31: qty 1

## 2021-01-31 NOTE — ED Provider Notes (Signed)
Patient signed to me by prior provider pending results of CT angio head which was negative.  Patient complaining of some dizziness but this seems to be consistent with peripheral vertigo.  Given Antivert with good response.  She felt stable to go home   Lorre Nick, MD 01/31/21 2031

## 2021-01-31 NOTE — ED Triage Notes (Signed)
Pt c/o dizziness, was just discharged for same. States not feeling better.

## 2021-01-31 NOTE — ED Provider Notes (Signed)
Emergency Medicine Provider Triage Evaluation Note  Hannah Cook , a 40 y.o. female  was evaluated in triage.  Pt complains of dizziness.  Patient reports constant dizziness since noon accompanied by headache.  She was having dizziness yesterday yesterday, and had a panic attack about her symptoms.  She took one of her home Xanax and not only did the chest pain, shortness of breath associated with her panic attack resolved, but his dizziness also resolved till this afternoon.  She has also noticed that she has had a change in her gait with the symptoms.  She reports that yesterday, she had a syncopal episode in her yard.  She denies numbness, weakness, visual changes, slurred speech.  She reports that she has also been taking diet pills for several months.  She had an abortion within the last 3 months.   She has a history of an aneurysm that was repaired with coiling in 2002.    Review of Systems  Positive: Dizziness, headache, gait change, syncope Negative: Vomiting, visual changes, numbness, weakness, fever, chills  Physical Exam  BP 135/83 (BP Location: Right Arm)   Pulse 88   Temp 97.9 F (36.6 C)   Resp 16   LMP 07/31/2020   SpO2 100%  Gen:   Awake, no distress   Resp:  Normal effort  MSK:   Moves extremities without difficulty  Other:  Sensations intact and equal to the bilateral upper and lower extremities.  Cranial nerves II through XII are grossly intact.  Medical Decision Making  Medically screening exam initiated at 3:07 AM.  Appropriate orders placed.  Hannah Cook was informed that the remainder of the evaluation will be completed by another provider, this initial triage assessment does not replace that evaluation, and the importance of remaining in the ED until their evaluation is complete.  Labs and imaging have been ordered.  She will require further work-up and evaluation in the emergency department.   Barkley Boards, PA-C 01/31/21 6160    Glynn Octave,  MD 01/31/21 [phone]

## 2021-01-31 NOTE — ED Provider Notes (Signed)
[facility] DEPT Provider Note   CSN: [id number] Arrival date & time: 01/31/21  1791     History Chief Complaint  Patient presents with   Dizziness    Hannah Cook is a 40 y.o. female with PMH hypothyroidism, anxiety with panic attacks, history of left-sided aneurysmal coiling approximately 15 years ago who presents to the emergency department for evaluation of dizziness, lightheadedness and headache.  She states that her pain began with a tooth ache on Saturday, for which she took 2 Percocet.  She then had a panic attack and took a Xanax.  She then proceeded to consume a large quantity of alcohol socially.  She states that her headache began the following morning with a associated dizziness.  She states that her headache has persisted since then and has improved in severity but she has intermittent dizziness and lightheadedness.  She came to the emergency department last night but left due to wait and her work-up was performed in the lobby at that time.  At that time with unremarkable chemistry, CBC unremarkable, CT head unremarkable.  She presents today with persistent mild left-sided headache and complaints of dysuria.  She believes she has urinary tract infection as well.  Denies numbness, tingling, weakness of the extremities.  Denies blurry vision, chest pain, shortness of breath, abdominal pain, nausea, vomiting or any other systemic symptoms.   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   Thyroid disease     Patient Active Problem List   Diagnosis Date Noted   Supervision of high risk pregnancy, antepartum 05/13/2019   AMA (advanced maternal age) multigravida 35+ 05/13/2019    Seizure disorder during pregnancy ([place]) 05/13/2019   Vaginal bleeding in pregnancy, third trimester 04/08/2017   Tobacco smoking complicating pregnancy 50/56/9794   History of intracranial aneurysm s/p coiling 11/05/2016   Obesity (BMI 30.0-34.9) 02/16/2014   Anxiety 03/10/2012    Past Surgical History:  Procedure Laterality Date   ANEURYSM COILING     BRAIN SURGERY     TONSILLECTOMY       OB History     Gravida  9   Para  4   Term  4   Preterm      AB  4   Living  4      SAB  1   IAB  2   Ectopic  0   Multiple  0   Live Births  4           Family History  Problem Relation Age of Onset   Thyroid disease Mother    Aortic aneurysm Mother        heart   Aneurysm Mother    Hypertension Maternal Aunt    Hypertension Maternal Uncle    Hypertension Maternal Grandmother    Diabetes Maternal Grandmother    Hypertension Sister    Diabetes Sister    Asthma Daughter     Social History   Tobacco Use   Smoking status: Former    Packs/day: 1.00    Types: Cigarettes   Smokeless tobacco: Never   Tobacco comments:    2 cigarettes  Vaping Use  Vaping Use: Never used  Substance Use Topics   Alcohol use: Not Currently   Drug use: No    Home Medications Prior to Admission medications   Medication Sig Start Date End Date Taking? Authorizing Provider  Blood Pressure Monitoring (BLOOD PRESSURE KIT) DEVI 1 Device by Does not apply route as needed. 05/13/19   Constant, Peggy, MD  lamoTRIgine (LAMICTAL) 100 MG tablet Take 200 mg by mouth 2 (two) times daily. 03/11/17   [provider]  TOBRADEX ophthalmic ointment SMARTSIG:0.5 Inch(es) In Eye(s) Twice Daily 08/04/20   [provider]  Vitamin D, Ergocalciferol, (DRISDOL) 1.25 MG (50000 UNIT) CAPS capsule Take 1 capsule by mouth once a week. 10/22/20   [provider]  phentermine (ADIPEX-P) 37.5 MG tablet Take 37.5 mg by mouth every morning. Pt states she takes on as needed basis 12/14/19  09/29/20  [provider]  topiramate (TOPAMAX) 25 MG tablet Take 25 mg by mouth 2 (two) times daily.  09/06/11  [provider]    Allergies    Hydroxacen [hydroxyzine] and Vicodin [hydrocodone-acetaminophen]  Review of Systems   Review of Systems  Constitutional:  Negative for chills and fever.  HENT:  Negative for ear pain and sore throat.   Eyes:  Negative for pain and visual disturbance.  Respiratory:  Negative for cough and shortness of breath.   Cardiovascular:  Negative for chest pain and palpitations.  Gastrointestinal:  Negative for abdominal pain and vomiting.  Genitourinary:  Positive for dysuria. Negative for hematuria.  Musculoskeletal:  Negative for arthralgias and back pain.  Skin:  Negative for color change and rash.  Neurological:  Positive for dizziness and headaches. Negative for seizures and syncope.  All other systems reviewed and are negative.  Physical Exam Updated Vital Signs BP 119/81 (BP Location: Right Arm)   Pulse 81   Temp 97.7 F (36.5 C) (Oral)   Resp 17   LMP 07/31/2020   SpO2 100%   Breastfeeding Unknown   Physical Exam Vitals and nursing note reviewed.  Constitutional:      General: She is not in acute distress.    Appearance: She is well-developed.  HENT:     Head: Normocephalic and atraumatic.  Eyes:     Conjunctiva/sclera: Conjunctivae normal.  Cardiovascular:     Rate and Rhythm: Normal rate and regular rhythm.     Heart sounds: No murmur heard. Pulmonary:     Effort: Pulmonary effort is normal. No respiratory distress.     Breath sounds: Normal breath sounds.  Abdominal:     Palpations: Abdomen is soft.     Tenderness: There is no abdominal tenderness.  Musculoskeletal:     Cervical back: Neck supple.  Skin:    General: Skin is warm and dry.  Neurological:     Mental Status: She is alert. Mental status is at baseline.     Cranial Nerves: No cranial nerve deficit.     Sensory: No sensory deficit.      Motor: No weakness.    ED Results / Procedures / Treatments   Labs (all labs ordered are listed, but only abnormal results are displayed) Labs Reviewed - No data to display  EKG None  Radiology CT HEAD WO CONTRAST (5MM)  Result Date: 01/31/2021 CLINICAL DATA:  Dizziness, headache EXAM: CT HEAD WITHOUT CONTRAST TECHNIQUE: Contiguous axial images were obtained from the base of the skull through the vertex without intravenous contrast. COMPARISON:  01/16/2014 FINDINGS: Brain: No evidence of acute infarction, hemorrhage, hydrocephalus,  extra-axial collection or mass lesion/mass effect. Encephalomalacic changes in the left parietal lobe. Encephalomalacic changes in the medial left frontal lobe (series 5/image 36). Vascular: No hyperdense vessel or unexpected calcification. Skull: Prior left frontoparietal craniotomy. Negative for fracture or focal lesion. Sinuses/Orbits: The visualized paranasal sinuses are essentially clear. The mastoid air cells are unopacified. Other: None. IMPRESSION: Encephalomalacic/postsurgical changes in the left parietal region. No evidence of acute intracranial abnormality. Electronically Signed   By: Julian Hy M.D.   On: 01/31/2021 03:41    Procedures Procedures   Medications Ordered in ED Medications - No data to display  ED Course  I have reviewed the triage vital signs and the nursing notes.  Pertinent labs & imaging results that were available during my care of the patient were reviewed by me and considered in my medical decision making (see chart for details).    MDM Rules/Calculators/A&P                           Patient seen the emergency department for evaluation of headache and dysuria.  Physical exam is largely unremarkable with an unremarkable neurologic exam with no focal motor or sensory deficits.  No cranial nerve deficits.  She was given a headache cocktail which improved both her dizziness and her headache.  Urinalysis suggestive of  cystitis and patient was discharged on [place].  Low suspicion for recurrence of aneurysm as cause for headache as patient's headache is not associated with any neurologic abnormalities, and CT head with no evidence of subarachnoid or ruptured aneurysm.  Patient's headache began after consumption of a large quantity of alcohol and likely precipitated by dehydration and post consumption effects.  I discussed this with the patient at length and using shared decision-making, we agreed on discharge today with strict return precautions of which she voiced understanding. Final Clinical Impression(s) / ED Diagnoses Final diagnoses:  None    Rx / DC Orders ED Discharge Orders     None        Gerson Fauth, MD 01/31/21 1110

## 2021-01-31 NOTE — ED Triage Notes (Signed)
Pt verbalized constant dizziness and headache since noon on Monday.  Pt fell Sunday and reports LOC.  Gait is unsteady and she reports that she now has a Chart[employer] certified [employer].  A/O X4 and no additional neuro deficits.

## 2021-01-31 NOTE — ED Provider Notes (Signed)
[facility] DEPT Provider Note   CSN: [id number] Arrival date & time: 01/31/21  1148     History Chief Complaint  Patient presents with   Dizziness    Hannah Cook is a 40 y.o. female with PMH hypothyroidism, anxiety with panic attacks, history of left-sided aneurysmal coiling approximately 15 years ago who presents to the emergency department for evaluation of dizziness, lightheadedness and headache.  She states that her pain began with a tooth ache on Saturday, for which she took 2 Percocet.  She then had a panic attack and took a Xanax.  She then proceeded to consume a large quantity of alcohol socially.  She states that her headache began the following morning with a associated dizziness.  She states that her headache has persisted since then and has improved in severity but she has intermittent dizziness and lightheadedness.  She came to the emergency department last night but left due to wait and her work-up was performed in the lobby at that time.  At that time with unremarkable chemistry, CBC unremarkable, CT head unremarkable.  She presents today with persistent mild left-sided headache and complaints of dysuria.  She believes she has urinary tract infection as well.  Denies numbness, tingling, weakness of the extremities.  Denies blurry vision, chest pain, shortness of breath, abdominal pain, nausea, vomiting or any other systemic symptoms.  I saw this patient earlier in the day and the patient received a headache cocktail and fluid resuscitation with improvement of symptoms and ultimately was discharged from emergency department.  However, upon returning to the patient's car, she turned her head to the left and had an acute onsets of vertiginous symptoms bringing her back to the emergency department.  On reevaluation, patient endorses persistent vertigo and nausea.  Denies chest pain, shortness of breath, abdominal pain, nausea, vomiting, numbness,  tingling, weakness or other systemic symptoms.  She states her headache is completely resolved.  In regards to her vertigo, she states that it is exacerbated with moving her head side to side and stops when she keeps her head still.   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   Thyroid disease     Patient Active Problem List   Diagnosis Date Noted   Supervision of high risk pregnancy, antepartum 05/13/2019   AMA (advanced maternal age) multigravida 35+ 05/13/2019   Seizure disorder during pregnancy ([place]) 05/13/2019   Vaginal bleeding in pregnancy, third trimester 04/08/2017   Tobacco smoking complicating pregnancy 62/08/5595   History of intracranial aneurysm s/p coiling 11/05/2016   Obesity (BMI 30.0-34.9) 02/16/2014   Anxiety 03/10/2012    Past Surgical History:  Procedure Laterality Date   ANEURYSM COILING     BRAIN SURGERY     TONSILLECTOMY       OB History     Gravida  9   Para  4   Term  4   Preterm      AB  4   Living  4      SAB  1   IAB  2   Ectopic  0   Multiple  0   Live Births  4  Family History  Problem Relation Age of Onset   Thyroid disease Mother    Aortic aneurysm Mother        heart   Aneurysm Mother    Hypertension Maternal Aunt    Hypertension Maternal Uncle    Hypertension Maternal Grandmother    Diabetes Maternal Grandmother    Hypertension Sister    Diabetes Sister    Asthma Daughter     Social History   Tobacco Use   Smoking status: Former    Packs/day: 1.00    Types: Cigarettes   Smokeless tobacco: Never   Tobacco comments:    2 cigarettes  Vaping Use   Vaping Use: Never used  Substance Use Topics   Alcohol use: Not Currently   Drug use: No    Home  Medications Prior to Admission medications   Medication Sig Start Date End Date Taking? Authorizing Provider  Blood Pressure Monitoring (BLOOD PRESSURE KIT) DEVI 1 Device by Does not apply route as needed. 05/13/19   Constant, Peggy, MD  cefadroxil (DURICEF) 500 MG capsule Take 1 capsule (500 mg total) by mouth 2 (two) times daily for 5 days. 01/31/21 02/05/21  Floetta Brickey, MD  lamoTRIgine (LAMICTAL) 100 MG tablet Take 200 mg by mouth 2 (two) times daily. 03/11/17   [provider]  Vitamin D, Ergocalciferol, (DRISDOL) 1.25 MG (50000 UNIT) CAPS capsule Take 1 capsule by mouth once a week. 10/22/20   [provider]  phentermine (ADIPEX-P) 37.5 MG tablet Take 37.5 mg by mouth every morning. Pt states she takes on as needed basis 12/14/19 09/29/20  [provider]  topiramate (TOPAMAX) 25 MG tablet Take 25 mg by mouth 2 (two) times daily.  09/06/11  [provider]    Allergies    Hydroxacen [hydroxyzine] and Vicodin [hydrocodone-acetaminophen]  Review of Systems   Review of Systems  Constitutional:  Negative for chills and fever.  HENT:  Negative for ear pain and sore throat.   Eyes:  Negative for pain and visual disturbance.  Respiratory:  Negative for cough and shortness of breath.   Cardiovascular:  Negative for chest pain and palpitations.  Gastrointestinal:  Negative for abdominal pain and vomiting.  Genitourinary:  Negative for dysuria and hematuria.  Musculoskeletal:  Negative for arthralgias and back pain.  Skin:  Negative for color change and rash.  Neurological:  Positive for dizziness. Negative for seizures and syncope.  All other systems reviewed and are negative.  Physical Exam Updated Vital Signs BP 135/89 (BP Location: Left Arm)   Pulse 73   Temp 98.5 F (36.9 C) (Oral)   Resp 17   LMP 07/31/2020   SpO2 100%   Physical Exam Vitals and nursing note reviewed.  Constitutional:      General: She is not in acute distress.     Appearance: She is well-developed.  HENT:     Head: Normocephalic and atraumatic.  Eyes:     Conjunctiva/sclera: Conjunctivae normal.  Cardiovascular:     Rate and Rhythm: Normal rate and regular rhythm.     Heart sounds: No murmur heard. Pulmonary:     Effort: Pulmonary effort is normal. No respiratory distress.     Breath sounds: Normal breath sounds.  Abdominal:     Palpations: Abdomen is soft.     Tenderness: There is no abdominal tenderness.  Musculoskeletal:     Cervical back: Neck supple.  Skin:    General: Skin is warm and dry.  Neurological:     Mental  Status: She is alert.    ED Results / Procedures / Treatments   Labs (all labs ordered are listed, but only abnormal results are displayed) Labs Reviewed - No data to display  EKG None  Radiology CT HEAD WO CONTRAST (5MM)  Result Date: 01/31/2021 CLINICAL DATA:  Dizziness, headache EXAM: CT HEAD WITHOUT CONTRAST TECHNIQUE: Contiguous axial images were obtained from the base of the skull through the vertex without intravenous contrast. COMPARISON:  01/16/2014 FINDINGS: Brain: No evidence of acute infarction, hemorrhage, hydrocephalus, extra-axial collection or mass lesion/mass effect. Encephalomalacic changes in the left parietal lobe. Encephalomalacic changes in the medial left frontal lobe (series 5/image 36). Vascular: No hyperdense vessel or unexpected calcification. Skull: Prior left frontoparietal craniotomy. Negative for fracture or focal lesion. Sinuses/Orbits: The visualized paranasal sinuses are essentially clear. The mastoid air cells are unopacified. Other: None. IMPRESSION: Encephalomalacic/postsurgical changes in the left parietal region. No evidence of acute intracranial abnormality. Electronically Signed   By: Julian Hy M.D.   On: 01/31/2021 03:41    Procedures Procedures   Medications Ordered in ED Medications - No data to display  ED Course  I have reviewed the triage vital signs and the  nursing notes.  Pertinent labs & imaging results that were available during my care of the patient were reviewed by me and considered in my medical decision making (see chart for details).    MDM Rules/Calculators/A&P                           Patient seen in the emergency department for evaluation of dizziness.  Physical exam reveals vertigo worsened with Dix-Hallpike, no numbness, tingling, weakness or other neurologic findings.  Patient was given Ativan for her dizziness as I was concerned that her previous headache cocktail containing 25 mg of Benadryl would react poorly with the anticholinergic properties of Antivert.  I-STAT is currently pending and patient will require CT angio brain and neck to assess for aneurysmal or carotid pathology in the setting of her persistent vertigo.  Patient then signed out to oncoming provider.  Please see provider signout note for continuation of work-up.  Final Clinical Impression(s) / ED Diagnoses Final diagnoses:  None    Rx / DC Orders ED Discharge Orders     None        Katha Kuehne, Debe Coder, MD 01/31/21 [phone]

## 2021-01-31 NOTE — ED Triage Notes (Signed)
Pt arrived via walk in, c/o dizziness and headache on and off since Saturday. Was seen at [facility] and urgent care but left due to wait.

## 2021-03-20 ENCOUNTER — Encounter (HOSPITAL_COMMUNITY): Payer: Self-pay | Admitting: Radiology

## 2021-03-25 ENCOUNTER — Other Ambulatory Visit: Payer: Self-pay

## 2021-03-25 ENCOUNTER — Encounter (HOSPITAL_COMMUNITY): Payer: Self-pay

## 2021-03-25 ENCOUNTER — Ambulatory Visit (HOSPITAL_COMMUNITY)
Admission: EM | Admit: 2021-03-25 | Discharge: 2021-03-25 | Disposition: A | Discharge status: Home / Self Care | Payer: Medicaid Other | Attending: Physician Assistant | Admitting: Physician Assistant

## 2021-03-25 DIAGNOSIS — J069 Acute upper respiratory infection, unspecified: Secondary | ICD-10-CM | POA: Insufficient documentation

## 2021-03-25 DIAGNOSIS — R8290 Unspecified abnormal findings in urine: Secondary | ICD-10-CM | POA: Diagnosis not present

## 2021-03-25 LAB — CBC WITH DIFFERENTIAL/PLATELET
Abs Immature Granulocytes: 0 10*3/uL (ref 0.00–0.07)
Basophils Absolute: 0.1 10*3/uL (ref 0.0–0.1)
Basophils Relative: 3 %
Eosinophils Absolute: 0 10*3/uL (ref 0.0–0.5)
Eosinophils Relative: 0 %
HCT: 41.2 % (ref 36.0–46.0)
Hemoglobin: 14.3 g/dL (ref 12.0–15.0)
Lymphocytes Relative: 38 %
Lymphs Abs: 1.2 10*3/uL (ref 0.7–4.0)
MCH: 32.6 pg (ref 26.0–34.0)
MCHC: 34.7 g/dL (ref 30.0–36.0)
MCV: 93.8 fL (ref 80.0–100.0)
Monocytes Absolute: 0.1 10*3/uL (ref 0.1–1.0)
Monocytes Relative: 4 %
Neutro Abs: 1.8 10*3/uL (ref 1.7–7.7)
Neutrophils Relative %: 55 %
Platelets: 201 10*3/uL (ref 150–400)
RBC: 4.39 MIL/uL (ref 3.87–5.11)
RDW: 12.8 % (ref 11.5–15.5)
WBC: 3.2 10*3/uL — ABNORMAL LOW (ref 4.0–10.5)
nRBC: 0 % (ref 0.0–0.2)
nRBC: 0 /100 WBC

## 2021-03-25 LAB — POCT URINALYSIS DIPSTICK, ED / UC
Glucose, UA: NEGATIVE mg/dL
Ketones, ur: 80 mg/dL — AB
Nitrite: NEGATIVE
Protein, ur: >=300 mg/dL — AB
Specific Gravity, Urine: >=1.03 (ref 1.005–1.030)
Urobilinogen, UA: 0.2 mg/dL (ref 0.0–1.0)
pH: 6 (ref 5.0–8.0)

## 2021-03-25 LAB — COMPREHENSIVE METABOLIC PANEL
ALT: 17 U/L (ref 0–44)
AST: 28 U/L (ref 15–41)
Albumin: 3.7 g/dL (ref 3.5–5.0)
Alkaline Phosphatase: 66 U/L (ref 38–126)
Anion gap: 8 (ref 5–15)
BUN: <5 mg/dL — ABNORMAL LOW (ref 6–20)
CO2: 24 mmol/L (ref 22–32)
Calcium: 8.6 mg/dL — ABNORMAL LOW (ref 8.9–10.3)
Chloride: 104 mmol/L (ref 98–111)
Creatinine, Ser: 0.76 mg/dL (ref 0.44–1.00)
GFR, Estimated: >60 mL/min (ref >60)
Glucose, Bld: 84 mg/dL (ref 70–99)
Potassium: 3.3 mmol/L — ABNORMAL LOW (ref 3.5–5.1)
Sodium: 136 mmol/L (ref 135–145)
Total Bilirubin: 0.4 mg/dL (ref 0.3–1.2)
Total Protein: 7.2 g/dL (ref 6.5–8.1)

## 2021-03-25 MED ORDER — SULFAMETHOXAZOLE-TRIMETHOPRIM 800-160 MG PO TABS: 1.0000 | ORAL_TABLET | Freq: Two times a day (BID) | ORAL | 0 refills | Status: AC

## 2021-03-25 NOTE — ED Provider Notes (Signed)
[place]    CSN: [id number] Arrival date & time: 03/25/21  1029      History   Chief Complaint Chief Complaint  Patient presents with   Cough   Generalized Body Aches    HPI Hannah Cook is a 40 y.o. female.   Patient here today for evaluation of nasal congestion and cough that has been ongoing for 4 days. She denies any fever. She does report that her urine has been malodorous and she is concerned about possible UTI as well. She denies any dysuria. She has not had any abd pain, nausea or vomiting. She denies vaginal discharge. She has tried OTC meds including tylenol and ibuprofen without significant relief.   The history is provided by the patient.  Cough Associated symptoms: myalgias   Associated symptoms: no chills, no ear pain, no eye discharge, no fever and no sore throat    Past Medical History:  Diagnosis Date   Aneurysm ([place]) 2002   Asthma    Hypothyroidism    Panic attack    Seizures ([place])    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   Thyroid disease     Patient Active Problem List   Diagnosis Date Noted   Supervision of high risk pregnancy, antepartum 05/13/2019   AMA (advanced maternal age) multigravida 35+ 05/13/2019   Seizure disorder during pregnancy ([place]) 05/13/2019   Vaginal bleeding in pregnancy, third trimester 04/08/2017   Tobacco smoking complicating pregnancy 66/11/3014   History of intracranial aneurysm s/p coiling 11/05/2016   Obesity (BMI 30.0-34.9) 02/16/2014   Anxiety 03/10/2012    Past Surgical History:  Procedure Laterality Date   ANEURYSM COILING     BRAIN SURGERY     TONSILLECTOMY      OB History     Gravida  9   Para  4   Term  4   Preterm      AB  4   Living  4      SAB  1   IAB  2   Ectopic  0   Multiple  0   Live Births  4            Home Medications    Prior to Admission medications   Medication Sig Start Date End Date Taking? Authorizing Provider   sulfamethoxazole-trimethoprim (BACTRIM DS) 800-160 MG tablet Take 1 tablet by mouth 2 (two) times daily for 7 days. 03/25/21 04/01/21 Yes Francene Finders, PA-C  Blood Pressure Monitoring (BLOOD PRESSURE KIT) DEVI 1 Device by Does not apply route as needed. 05/13/19   Constant, Peggy, MD  lamoTRIgine (LAMICTAL) 100 MG tablet Take 200 mg by mouth 2 (two) times daily. 03/11/17   [provider]  meclizine (ANTIVERT) 25 MG tablet Take 1 tablet (25 mg total) by mouth 3 (three) times daily as needed for dizziness. 01/31/21   Lacretia Leigh, MD  Vitamin D, Ergocalciferol, (DRISDOL) 1.25 MG (50000 UNIT) CAPS capsule Take 1 capsule by mouth once a week. 10/22/20   [provider]  phentermine (ADIPEX-P) 37.5 MG tablet Take 37.5 mg by mouth every morning. Pt states she takes on as needed basis 12/14/19 09/29/20  [provider]  topiramate (TOPAMAX) 25 MG tablet Take 25 mg by mouth 2 (two) times daily.  09/06/11  [provider]    Family History Family History  Problem Relation Age of Onset   Thyroid disease Mother    Aortic aneurysm Mother  heart   Aneurysm Mother    Hypertension Maternal Aunt    Hypertension Maternal Uncle    Hypertension Maternal Grandmother    Diabetes Maternal Grandmother    Hypertension Sister    Diabetes Sister    Asthma Daughter     Social History Social History   Tobacco Use   Smoking status: Former    Packs/day: 1.00    Types: Cigarettes   Smokeless tobacco: Never   Tobacco comments:    2 cigarettes  Vaping Use   Vaping Use: Never used  Substance Use Topics   Alcohol use: Not Currently   Drug use: No     Allergies   Hydroxacen [hydroxyzine] and Vicodin [hydrocodone-acetaminophen]   Review of Systems Review of Systems  Constitutional:  Negative for chills and fever.  HENT:  Positive for congestion. Negative for ear pain and sore throat.   Eyes:  Negative for discharge and redness.  Respiratory:  Positive for  cough.   Gastrointestinal:  Negative for abdominal pain, nausea and vomiting.  Genitourinary:  Negative for dysuria, frequency, urgency and vaginal discharge.  Musculoskeletal:  Positive for myalgias.    Physical Exam Triage Vital Signs ED Triage Vitals  Enc Vitals Group     BP 03/25/21 1155 106/67     Pulse Rate 03/25/21 1155 99     Resp 03/25/21 1155 19     Temp 03/25/21 1155 98.9 F (37.2 C)     Temp Source 03/25/21 1155 Oral     SpO2 03/25/21 1155 100 %     Weight --      Height --      Head Circumference --      Peak Flow --      Pain Score 03/25/21 1153 0     Pain Loc --      Pain Edu? --      Excl. in [place]? --    No data found.  Updated Vital Signs BP 106/67 (BP Location: Left Arm)   Pulse 99   Temp 98.9 F (37.2 C) (Oral)   Resp 19   LMP 07/31/2020   SpO2 100%     Physical Exam Vitals and nursing note reviewed.  Constitutional:      General: She is not in acute distress.    Appearance: Normal appearance. She is not ill-appearing.  HENT:     Head: Normocephalic and atraumatic.     Right Ear: Tympanic membrane normal.     Left Ear: Tympanic membrane normal.     Nose: Congestion present.     Mouth/Throat:     Mouth: Mucous membranes are moist.     Pharynx: No oropharyngeal exudate or posterior oropharyngeal erythema.  Eyes:     Conjunctiva/sclera: Conjunctivae normal.  Cardiovascular:     Rate and Rhythm: Normal rate and regular rhythm.     Heart sounds: Normal heart sounds. No murmur heard. Pulmonary:     Effort: Pulmonary effort is normal. No respiratory distress.     Breath sounds: Normal breath sounds. No wheezing, rhonchi or rales.  Skin:    General: Skin is warm and dry.  Neurological:     Mental Status: She is alert.  Psychiatric:        Mood and Affect: Mood normal.        Thought Content: Thought content normal.     UC Treatments / Results  Labs (all labs ordered are listed, but only abnormal results are displayed) Labs Reviewed   POCT URINALYSIS DIPSTICK,  ED / UC - Abnormal; Notable for the following components:      Result Value   Bilirubin Urine SMALL (*)    Ketones, ur 80 (*)    Hgb urine dipstick SMALL (*)    Protein, ur >=300 (*)    Leukocytes,Ua TRACE (*)    All other components within normal limits  URINE CULTURE  COMPREHENSIVE METABOLIC PANEL  CBC WITH DIFFERENTIAL/PLATELET    EKG   Radiology No results found.  Procedures Procedures (including critical care time)  Medications Ordered in UC Medications - No data to display  Initial Impression / Assessment and Plan / UC Course  I have reviewed the triage vital signs and the nursing notes.  Pertinent labs & imaging results that were available during my care of the patient were reviewed by me and considered in my medical decision making (see chart for details).   UA with positive LE- will treat to cover possible UTI with bactrim and urine culture ordered. Discussed that bactrim would also cover sinus infection if bacterial etc. Recommended CMP and CBC as well given protein in urine, patient is agreeable with this. Encouraged follow up if no improvement with treatment or with any further concerns.   Final Clinical Impressions(s) / UC Diagnoses   Final diagnoses:  Malodorous urine  Viral upper respiratory tract infection     Discharge Instructions      Take antibiotic as prescribed. We will notify of results on MyChart or by phone with any concerning findings.      ED Prescriptions     Medication Sig Dispense Auth. Provider   sulfamethoxazole-trimethoprim (BACTRIM DS) 800-160 MG tablet Take 1 tablet by mouth 2 (two) times daily for 7 days. 14 tablet Francene Finders, PA-C      PDMP not reviewed this encounter.   Francene Finders, PA-C 03/25/21 1251

## 2021-03-25 NOTE — Discharge Instructions (Addendum)
Take antibiotic as prescribed. We will notify of results on MyChart or by phone with any concerning findings.

## 2021-03-25 NOTE — ED Triage Notes (Signed)
Pt presents with a cough, runny nose ad body aches X 4 days.  States she has vaginal odor.

## 2021-03-26 LAB — URINE CULTURE: Culture: <10000 — AB

## 2021-04-27 ENCOUNTER — Other Ambulatory Visit: Payer: Self-pay | Admitting: Internal Medicine

## 2021-04-28 LAB — COMPLETE METABOLIC PANEL WITH GFR
AG Ratio: 1.7 (calc) (ref 1.0–2.5)
ALT: 10 U/L (ref 6–29)
AST: 16 U/L (ref 10–30)
Albumin: 4.5 g/dL (ref 3.6–5.1)
Alkaline phosphatase (APISO): 79 U/L (ref 31–125)
BUN: 12 mg/dL (ref 7–25)
CO2: 19 mmol/L — ABNORMAL LOW (ref 20–32)
Calcium: 9.2 mg/dL (ref 8.6–10.2)
Chloride: 104 mmol/L (ref 98–110)
Creat: 0.71 mg/dL (ref 0.50–0.99)
Globulin: 2.7 g/dL (calc) (ref 1.9–3.7)
Glucose, Bld: 64 mg/dL — ABNORMAL LOW (ref 65–99)
Potassium: 3.9 mmol/L (ref 3.5–5.3)
Sodium: 137 mmol/L (ref 135–146)
Total Bilirubin: 0.6 mg/dL (ref 0.2–1.2)
Total Protein: 7.2 g/dL (ref 6.1–8.1)
eGFR: 110 mL/min/{1.73_m2} (ref >=60)

## 2021-04-28 LAB — CBC
HCT: 43.2 % (ref 35.0–45.0)
Hemoglobin: 15.1 g/dL (ref 11.7–15.5)
MCH: 33.6 pg — ABNORMAL HIGH (ref 27.0–33.0)
MCHC: 35 g/dL (ref 32.0–36.0)
MCV: 96 fL (ref 80.0–100.0)
MPV: 9.3 fL (ref 7.5–12.5)
Platelets: 314 10*3/uL (ref 140–400)
RBC: 4.5 10*6/uL (ref 3.80–5.10)
RDW: 13.5 % (ref 11.0–15.0)
WBC: 5.9 10*3/uL (ref 3.8–10.8)

## 2021-04-28 LAB — LIPID PANEL
Cholesterol: 155 mg/dL (ref <200)
HDL: 62 mg/dL (ref >=50)
LDL Cholesterol (Calc): 74 mg/dL (calc)
Non-HDL Cholesterol (Calc): 93 mg/dL (calc) (ref <130)
Total CHOL/HDL Ratio: 2.5 (calc) (ref <5.0)
Triglycerides: 109 mg/dL (ref <150)

## 2021-04-28 LAB — T3: T3, Total: 116 ng/dL (ref 76–181)

## 2021-04-28 LAB — T4, FREE: Free T4: 1.2 ng/dL (ref 0.8–1.8)

## 2021-04-28 LAB — TSH: TSH: 0.68 mIU/L

## 2021-04-28 LAB — T3 UPTAKE: T3 Uptake: 30 % (ref 22–35)

## 2021-04-28 LAB — VITAMIN D 25 HYDROXY (VIT D DEFICIENCY, FRACTURES): Vit D, 25-Hydroxy: 54 ng/mL (ref 30–100)

## 2021-07-03 IMAGING — US US OB COMP LESS 14 WK
1 series · 15 of 28 positions shown · non-contrast
Comparison: September 29, 2020.

CLINICAL DATA: Lower abdominal cramping.

EXAM:
OBSTETRIC <14 WK ULTRASOUND
TECHNIQUE: Transabdominal ultrasound was performed for evaluation of the
gestation as well as the maternal uterus and adnexal regions.

[Series 1: us ob comp less 14 wk · 32 acquisitions, 15 frames shown]
[im 1/32]
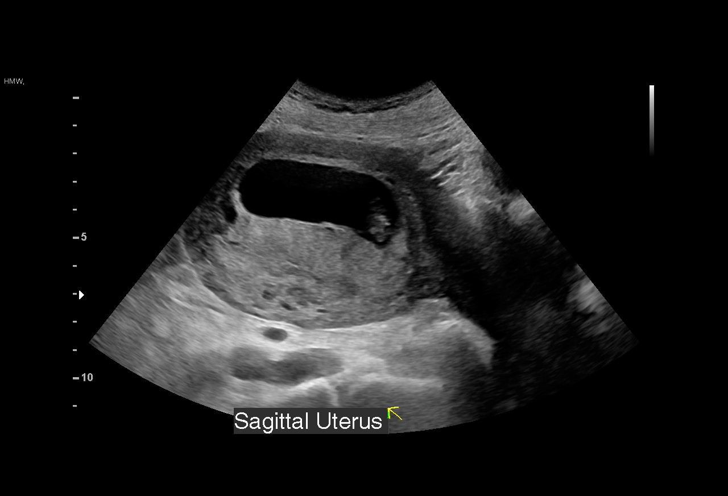
[im 3/32]
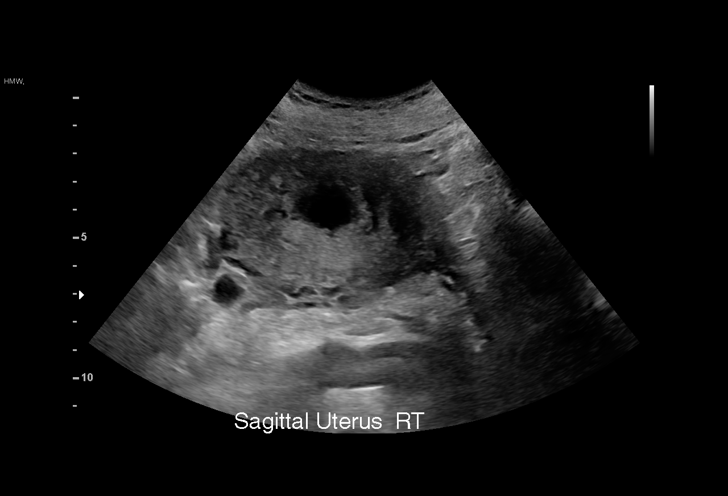
[im 5/32]
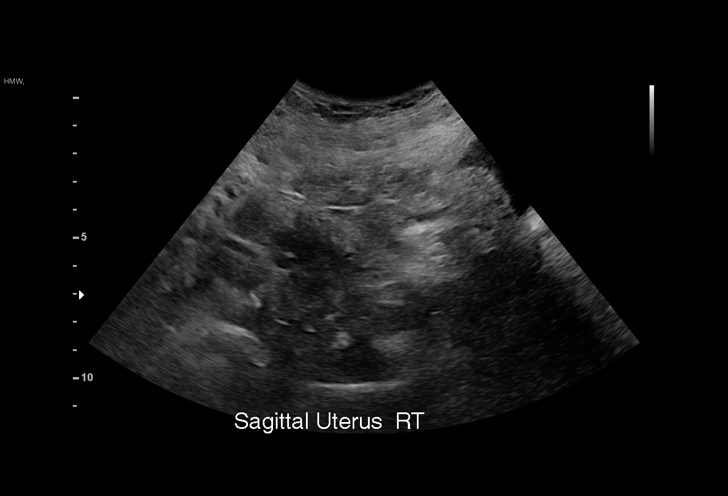
[im 7/32]
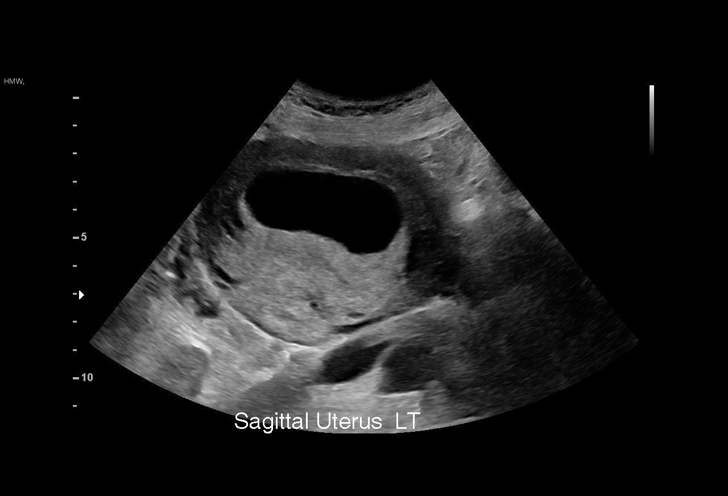
[im 10/32]
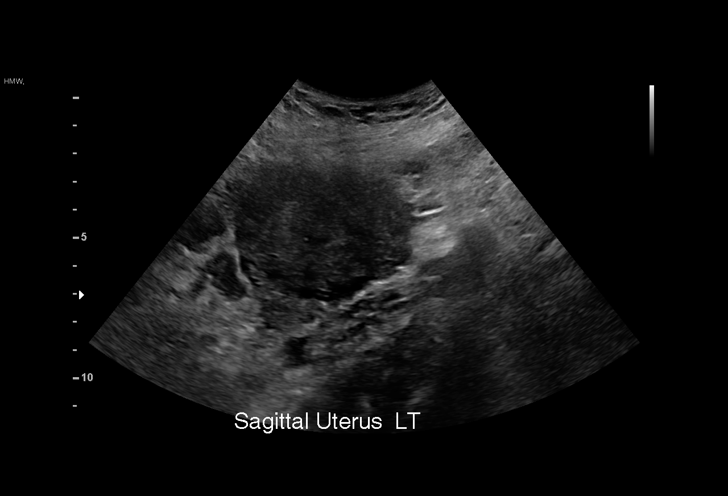
[im 12/32]
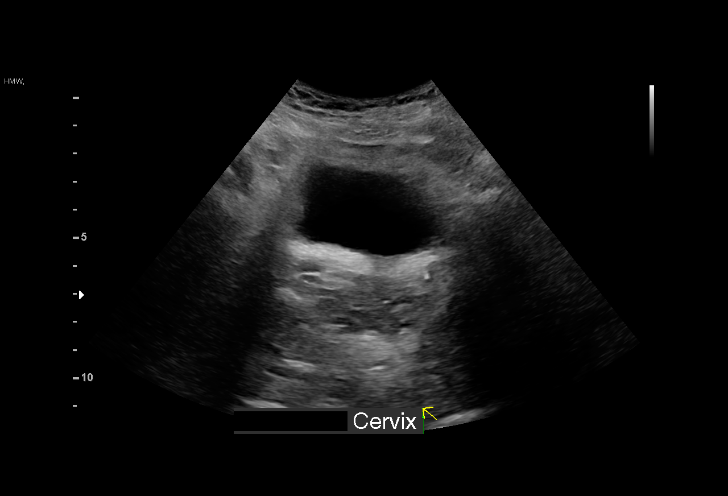
[im 14/32]
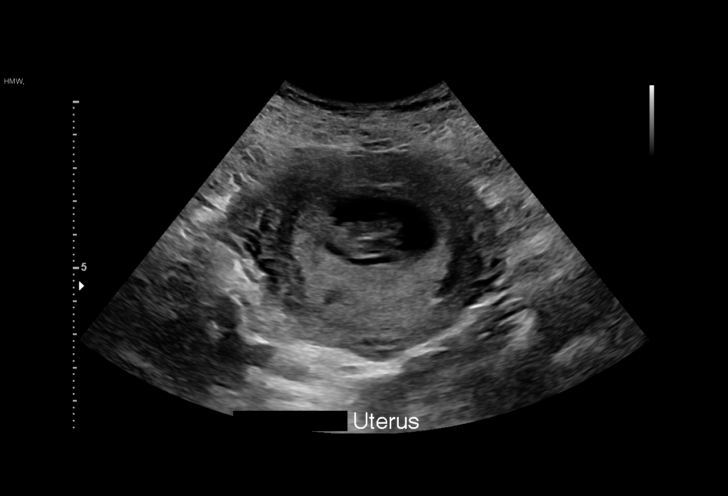
[im 17/32]
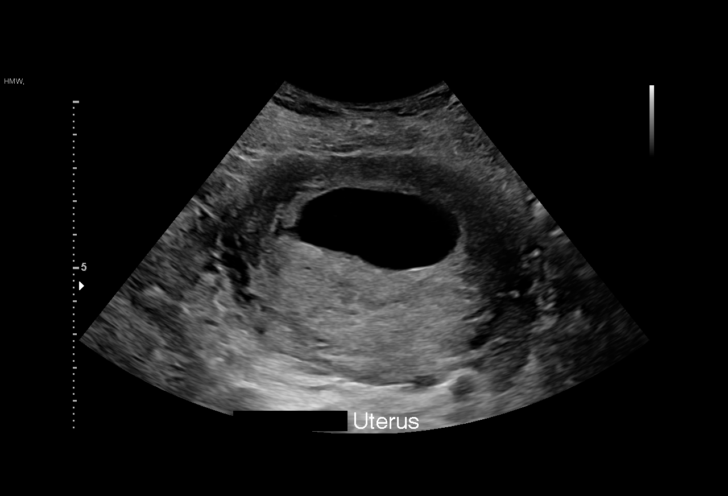
[im 18/32]
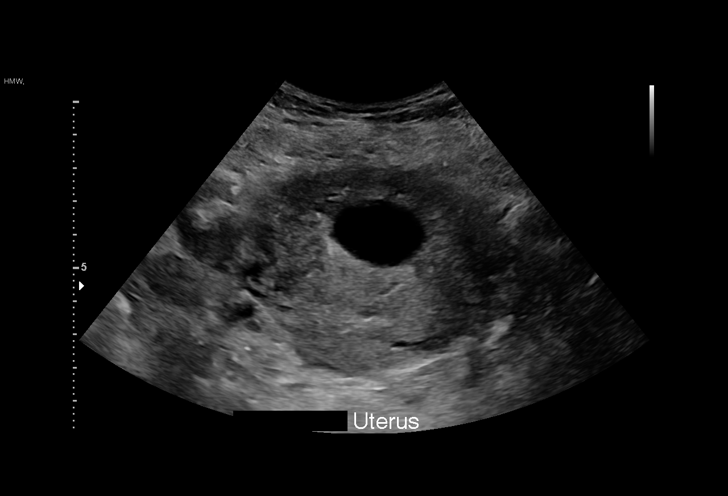
[im 20/32]
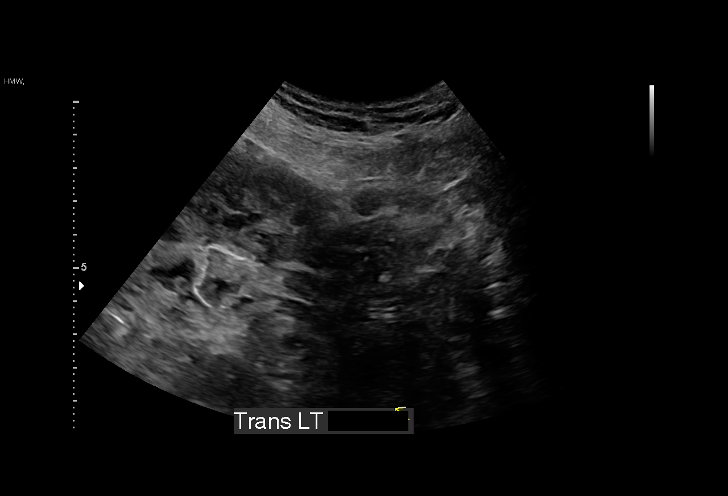
[im 22/32]
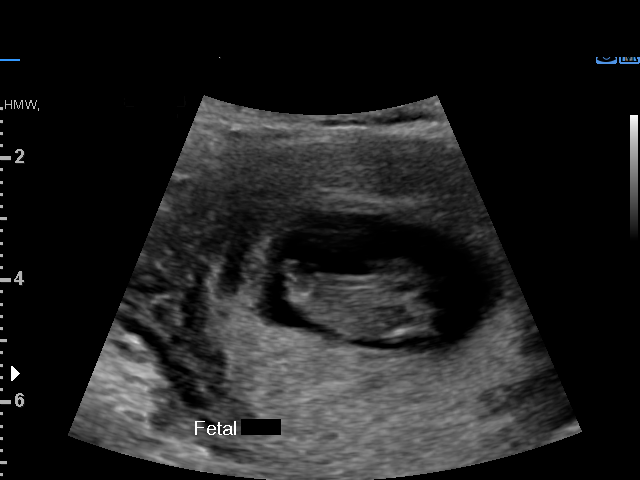
[im 25/32]
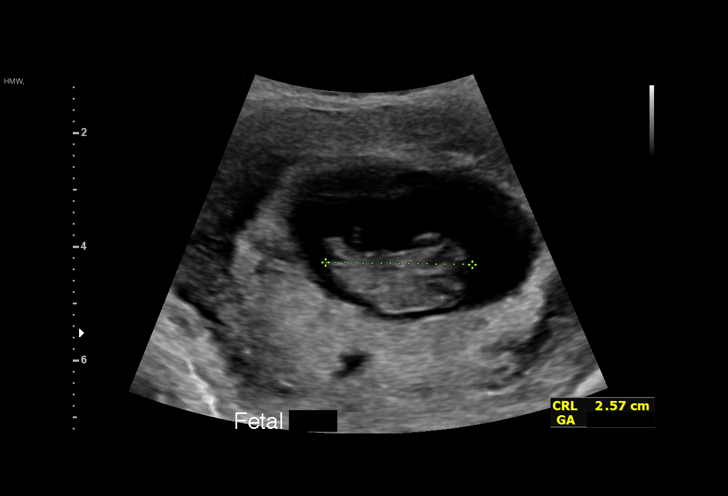
[im 27/32]
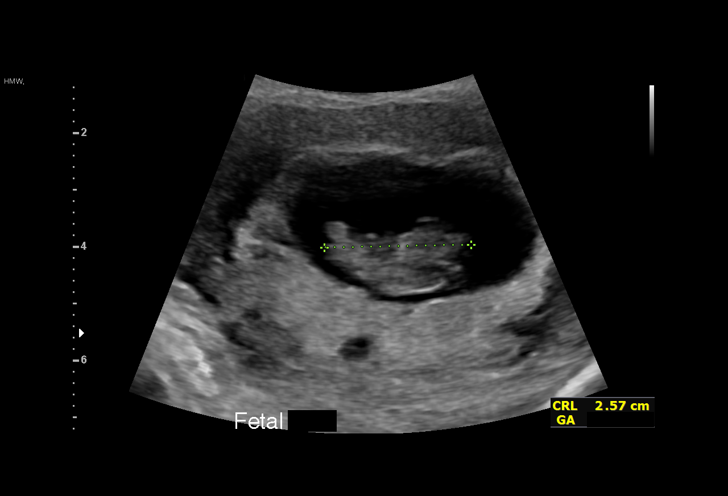
[im 29/32]
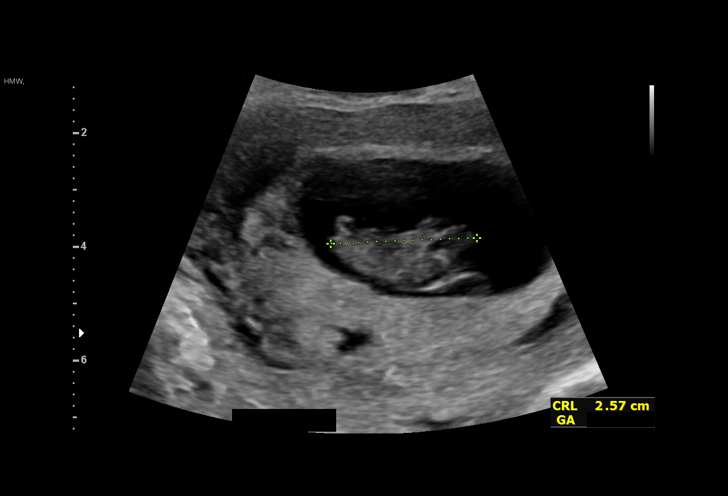
[im 32/32]
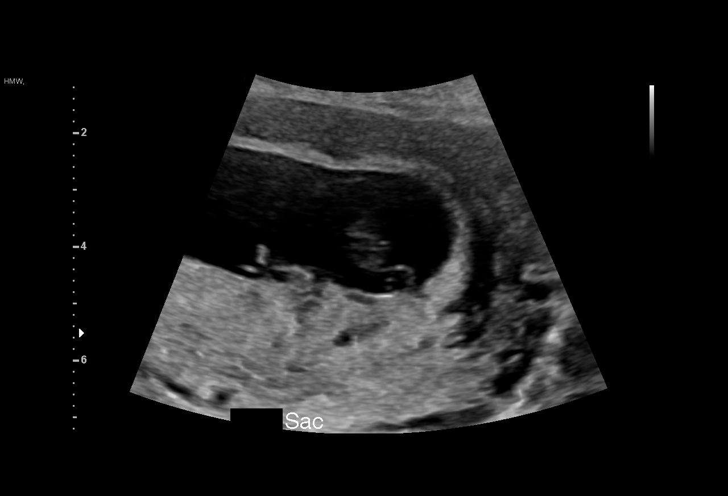

[15 of 28 positions shown; findings below may reference images not displayed]

FINDINGS: Intrauterine gestational sac: Single

Yolk sac:  Visualized.

Embryo:  Visualized.

Cardiac Activity: Visualized.

Heart Rate: 180 bpm

CRL:   25.7 mm   9 w 2 d                  US EDC: June 05, 2021.

Subchorionic hemorrhage:  None visualized.

Maternal uterus/adnexae: Ovaries are not visualized. No free fluid
is noted.
IMPRESSION: Single live intrauterine gestation of 9 weeks 2 days.

## 2021-09-27 ENCOUNTER — Telehealth (HOSPITAL_COMMUNITY): Payer: Self-pay

## 2021-09-27 ENCOUNTER — Ambulatory Visit (HOSPITAL_COMMUNITY)
Admission: EM | Admit: 2021-09-27 | Discharge: 2021-09-27 | Disposition: A | Discharge status: Home / Self Care | Payer: Medicaid Other | Attending: Physician Assistant | Admitting: Physician Assistant

## 2021-09-27 ENCOUNTER — Encounter (HOSPITAL_COMMUNITY): Payer: Self-pay

## 2021-09-27 DIAGNOSIS — G5603 Carpal tunnel syndrome, bilateral upper limbs: Secondary | ICD-10-CM | POA: Diagnosis not present

## 2021-09-27 MED ORDER — PREDNISONE 10 MG (21) PO TBPK: ORAL_TABLET | ORAL | 0 refills | Status: DC

## 2021-09-27 MED ORDER — METHYLPREDNISOLONE ACETATE 80 MG/ML IJ SUSP: 60.0000 mg | Freq: Once | INTRAMUSCULAR | Status: DC

## 2021-09-27 NOTE — Discharge Instructions (Addendum)
I believe that you have a flare of carpal tunnel.  Please use braces particularly at night until symptoms resolve.  We gave you an injection of steroids today.  You should not take NSAIDs including aspirin, ibuprofen/Advil, naproxen/Aleve with this medication as it can cause stomach bleeding.  You can use Tylenol for breakthrough pain.  If your symptoms persist you may need to follow-up with orthopedic to consider surgery or other interventions.  Please call them to schedule an appointment.  If you have any worsening symptoms including severe pain or weakness in your hands you should be seen immediately. ?

## 2021-09-27 NOTE — ED Provider Notes (Addendum)
[facility]    CSN: [id number] Arrival date & time: 09/27/21  1914      History   Chief Complaint Chief Complaint  Patient presents with   Hand Pain    HPI Hannah Cook is a 41 y.o. female.   Patient presents today with a 2-day history of increasing pain in both hands but worse on the left.  She has a history of carpal tunnel related to pregnancies but symptoms have been adequately controlled until the past few days.  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  Pain is localized to bilateral wrist with radiation into first 2 fingers of both hands but worse on the left, described as shooting with periodic numbness/tingling sensation, no aggravating relieving factors identified.  She has not tried any over-the-counter medication for symptom management.  She has not seen orthopedic in the past or had any carpal tunnel release surgery.  She does not currently have a wrist brace.  She is having difficulty with daily activities as a result of symptoms.   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   Thyroid disease     Patient Active Problem List   Diagnosis Date Noted   Supervision of high risk pregnancy, antepartum 05/13/2019   AMA (advanced maternal age) multigravida 35+ 05/13/2019   Seizure disorder during pregnancy (HCC) 05/13/2019   Vaginal bleeding in pregnancy, third trimester 04/08/2017   Tobacco smoking complicating pregnancy 01/30/2017   History of intracranial aneurysm s/p coiling 11/05/2016   Obesity (BMI 30.0-34.9) 02/16/2014   Anxiety 03/10/2012    Past Surgical History:  Procedure Laterality Date   ANEURYSM COILING     BRAIN SURGERY     TONSILLECTOMY      OB History     Gravida  9    Para  4   Term  4   Preterm      AB  4   Living  4      SAB  1   IAB  2   Ectopic  0   Multiple  0   Live Births  4            Home Medications    Prior to Admission medications   Medication Sig Start Date End Date Taking? Authorizing Provider  predniSONE (STERAPRED UNI-PAK 21 TAB) 10 MG (21) TBPK tablet As directed 09/27/21  Yes Jenne Sellinger K, PA-C  Blood Pressure Monitoring (BLOOD PRESSURE KIT) DEVI 1 Device by Does not apply route as needed. 05/13/19   Constant, Peggy, MD  lamoTRIgine (LAMICTAL) 100 MG tablet Take 200 mg by mouth 2 (two) times daily. 03/11/17   [provider]  meclizine (ANTIVERT) 25 MG tablet Take 1 tablet (25 mg total) by mouth 3 (three) times daily as needed for dizziness. 01/31/21   Lorre Nick, MD  Vitamin D, Ergocalciferol, (DRISDOL) 1.25 MG (50000 UNIT) CAPS capsule Take 1 capsule by mouth once a week. 10/22/20   [provider]  phentermine (ADIPEX-P) 37.5 MG tablet Take 37.5 mg by mouth every morning. Pt states she takes on as needed basis 12/14/19 09/29/20  [provider]  topiramate (TOPAMAX) 25 MG tablet Take  25 mg by mouth 2 (two) times daily.  09/06/11  [provider]    Family History Family History  Problem Relation Age of Onset   Thyroid disease Mother    Aortic aneurysm Mother        heart   Aneurysm Mother    Hypertension Maternal Aunt    Hypertension Maternal Uncle    Hypertension Maternal Grandmother    Diabetes Maternal Grandmother    Hypertension Sister    Diabetes Sister    Asthma Daughter     Social History Social History   Tobacco Use   Smoking status: Former    Packs/day: 1.00    Types: Cigarettes   Smokeless tobacco: Never   Tobacco comments:    2 cigarettes  Vaping Use   Vaping Use: Never used  Substance Use Topics   Alcohol use: Not Currently   Drug use: No     Allergies   Hydroxacen [hydroxyzine] and Vicodin [hydrocodone-acetaminophen]   Review of  Systems Review of Systems  Constitutional:  Positive for activity change. Negative for appetite change, fatigue and fever.  Musculoskeletal:  Positive for arthralgias and myalgias.  Neurological:  Negative for dizziness, weakness, light-headedness, numbness and headaches.    Physical Exam Triage Vital Signs ED Triage Vitals [09/27/21 2003]  Enc Vitals Group     BP 106/73     Pulse Rate 87     Resp 18     Temp 98.6 F (37 C)     Temp Source Oral     SpO2 100 %     Weight      Height      Head Circumference      Peak Flow      Pain Score      Pain Loc      Pain Edu?      Excl. in GC?    No data found.  Updated Vital Signs BP 106/73 (BP Location: Right Arm)   Pulse 87   Temp 98.6 F (37 C) (Oral)   Resp 18   LMP 09/27/2021 (Exact Date)   SpO2 100%   Visual Acuity Right Eye Distance:   Left Eye Distance:   Bilateral Distance:    Right Eye Near:   Left Eye Near:    Bilateral Near:     Physical Exam Vitals reviewed.  Constitutional:      General: She is awake. She is not in acute distress.    Appearance: Normal appearance. She is well-developed. She is not ill-appearing.     Comments: Very pleasant female appears stated age in no acute distress sitting comfortably in exam room  HENT:     Head: Normocephalic and atraumatic.  Cardiovascular:     Rate and Rhythm: Normal rate and regular rhythm.     Pulses:          Radial pulses are 2+ on the right side and 2+ on the left side.     Heart sounds: Normal heart sounds, S1 normal and S2 normal. No murmur heard. Pulmonary:     Effort: Pulmonary effort is normal.     Breath sounds: Normal breath sounds. No wheezing, rhonchi or rales.     Comments: Clear to auscultation bilaterally Musculoskeletal:     Right hand: Tenderness present. No swelling or bony tenderness. Normal range of motion. Normal strength. There is no disruption of two-point discrimination.     Left hand: Tenderness present. No swelling or bony  tenderness. Normal range of motion.  Normal strength. There is no disruption of two-point discrimination.     Comments: Hands: Tenderness palpation over volar wrist.  No deformity noted.  Normal pincer grip strength.  Hand neurovascularly intact.  Positive Tinel and Phalen's on the left.  Psychiatric:        Behavior: Behavior is cooperative.     UC Treatments / Results  Labs (all labs ordered are listed, but only abnormal results are displayed) Labs Reviewed - No data to display  EKG   Radiology No results found.  Procedures Procedures (including critical care time)  Medications Ordered in UC Medications - No data to display   Initial Impression / Assessment and Plan / UC Course  I have reviewed the triage vital signs and the nursing notes.  Pertinent labs & imaging results that were available during my care of the patient were reviewed by me and considered in my medical decision making (see chart for details).     Suspect carpal tunnel as etiology of symptoms.  Patient was started on prednisone taper for pain and inflammation.  Discussed that she should not take NSAIDs with this medication.  Can use Tylenol for breakthrough pain.  She was placed in bilateral splints with instruction to use this most of the time particularly at night.  If symptoms or not improving she is to follow-up with orthopedics and was given contact information for local provider with instruction to call to schedule an appointment.  Discussed that if anything worsens and she has weakness, difficulty moving her fingers, numbness, paresthesias she should be seen immediately.  Strict return precautions given.  Work excuse note provided.  At the end of visit patient requested injection of steroids rather than prednisone taper.  Discussed that I thought that prednisone taper would be more effective but patient preferred injection.  She was given 60 mg of Depo-Medrol in clinic.  Canceled prednisone taper with  pharmacy.  Patient left prior to receiving injection.  Steroid taper sent to pharmacy.  Final Clinical Impressions(s) / UC Diagnoses   Final diagnoses:  Bilateral carpal tunnel syndrome     Discharge Instructions      I believe that you have a flare of carpal tunnel.  Please use braces particularly at night until symptoms resolve.  We gave you an injection of steroids today.  You should not take NSAIDs including aspirin, ibuprofen/Advil, naproxen/Aleve with this medication as it can cause stomach bleeding.  You can use Tylenol for breakthrough pain.  If your symptoms persist you may need to follow-up with orthopedic to consider surgery or other interventions.  Please call them to schedule an appointment.  If you have any worsening symptoms including severe pain or weakness in your hands you should be seen immediately.     ED Prescriptions     Medication Sig Dispense Auth. Provider   predniSONE (STERAPRED UNI-PAK 21 TAB) 10 MG (21) TBPK tablet  (Status: Discontinued) As directed 21 tablet Juno Bozard K, PA-C   predniSONE (STERAPRED UNI-PAK 21 TAB) 10 MG (21) TBPK tablet As directed 21 tablet Dequavion Follette K, PA-C      PDMP not reviewed this encounter.   Jeani Hawking, PA-C 09/27/21 2019    Rylei Masella, Noberto Retort, PA-C 09/27/21 2024    Kierstynn Babich, Noberto Retort, PA-C 09/27/21 2026

## 2021-09-27 NOTE — ED Triage Notes (Signed)
Pt presents with bilateral hand pain X 2 days. ?Pt denies injuries. States she recently started working out.  ? ?

## 2022-01-01 ENCOUNTER — Ambulatory Visit (HOSPITAL_COMMUNITY)
Admission: EM | Admit: 2022-01-01 | Discharge: 2022-01-01 | Disposition: A | Discharge status: Home / Self Care | Payer: Medicaid Other | Attending: Physician Assistant | Admitting: Physician Assistant

## 2022-01-01 ENCOUNTER — Ambulatory Visit (HOSPITAL_COMMUNITY)
Admission: EM | Admit: 2022-01-01 | Discharge: 2022-01-01 | Discharge status: Against Medical Advice | Payer: Medicaid Other

## 2022-01-01 ENCOUNTER — Encounter (HOSPITAL_COMMUNITY): Payer: Self-pay

## 2022-01-01 VITALS — BP 112/67 | HR 93 | Temp 98.2°F | Resp 18

## 2022-01-01 DIAGNOSIS — M546 Pain in thoracic spine: Secondary | ICD-10-CM | POA: Diagnosis not present

## 2022-01-01 DIAGNOSIS — M62830 Muscle spasm of back: Secondary | ICD-10-CM

## 2022-01-01 DIAGNOSIS — M25552 Pain in left hip: Secondary | ICD-10-CM

## 2022-01-01 LAB — POCT URINALYSIS DIPSTICK, ED / UC
Bilirubin Urine: NEGATIVE
Glucose, UA: NEGATIVE mg/dL
Hgb urine dipstick: NEGATIVE
Ketones, ur: NEGATIVE mg/dL
Leukocytes,Ua: NEGATIVE
Nitrite: NEGATIVE
Protein, ur: NEGATIVE mg/dL
Specific Gravity, Urine: >=1.03 (ref 1.005–1.030)
Urobilinogen, UA: 0.2 mg/dL (ref 0.0–1.0)
pH: 5.5 (ref 5.0–8.0)

## 2022-01-01 MED ORDER — TIZANIDINE HCL 4 MG PO TABS
4.0000 mg | ORAL_TABLET | Freq: Four times a day (QID) | ORAL | 0 refills | Status: DC | PRN
Start: 2022-01-01 — End: 2022-12-03

## 2022-01-01 MED ORDER — IBUPROFEN 600 MG PO TABS: 600.0000 mg | ORAL_TABLET | Freq: Three times a day (TID) | ORAL | 0 refills | Status: DC

## 2022-01-01 NOTE — ED Triage Notes (Signed)
Patient states having right side pelvic pain for a few days. Last night the low back begin to hurt as well.   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   Patient leaving AMA as she needs to get back to work.

## 2022-01-01 NOTE — Discharge Instructions (Addendum)
Advised to use ice to the area, 10 minutes on 20 minutes off, 4-5 times throughout the day to help reduce the pain and spasm. Advised to take the ibuprofen 600 mg 1 every 8 hours with food to help reduce the pain. Advised to take Zanaflex 1 every 6-8 hours to help reduce the muscle spasm Advised to follow-up with PCP or return to urgent care if symptoms fail to improve.

## 2022-01-01 NOTE — ED Provider Notes (Signed)
[facility]    CSN: [id number] Arrival date & time: 01/01/22  1356      History   Chief Complaint Chief Complaint  Patient presents with   Back Pain   Groin Pain    HPI Hannah Cook is a 41 y.o. female.   41 year old female presents with lower back pain left side, and pelvic pain and right side.  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  Patient indicates this improved but then she slept wrong on her bed and started having back pain again.  Patient indicates the pain is on the left side middle portion of the back and is localized.  Patient relates that it is worse when she bends or turns or lifts.  Patient relates she is not getting relief with Tylenol OTC.  Patient also indicates that she is having some right pelvic pain that is on the anterior lower right side.  Patient indicates that this has been intermittent over the past several days.  Patient did request to have STI performed today however she declined to have the RPR and HIV performed because she did this several months ago and it was negative.  Patient also relates that last menses was 4 days ago and it was normal.  Patient indicates that she is not having any unusual vaginal discharge over the past couple days.  Patient denies fever or chills.   Back Pain Groin Pain    Past Medical History:  Diagnosis Date   Aneurysm ([place]) 2002   Asthma    Hypothyroidism    Panic attack    Seizures ([place])    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   Thyroid disease     Patient Active Problem List   Diagnosis Date Noted   Supervision of high risk pregnancy, antepartum 05/13/2019   AMA (advanced maternal age) multigravida 35+ 05/13/2019   Seizure disorder during pregnancy ([place]) 05/13/2019   Vaginal bleeding in pregnancy, third trimester 04/08/2017   Tobacco smoking complicating pregnancy 70/26/3785   History of intracranial aneurysm s/p coiling  11/05/2016   Obesity (BMI 30.0-34.9) 02/16/2014   Anxiety 03/10/2012    Past Surgical History:  Procedure Laterality Date   ANEURYSM COILING     BRAIN SURGERY     TONSILLECTOMY      OB History     Gravida  9   Para  4   Term  4   Preterm      AB  4   Living  4      SAB  1   IAB  2   Ectopic  0   Multiple  0   Live Births  4            Home Medications    Prior to Admission medications   Medication Sig Start Date End Date Taking? Authorizing Provider  ibuprofen (ADVIL) 600 MG tablet Take 1 tablet (600 mg total) by mouth 3 (three) times daily. 01/01/22  Yes Nyoka Lint, PA-C  tiZANidine (ZANAFLEX) 4 MG tablet Take 1 tablet (4 mg total) by mouth every 6 (six) hours as needed for muscle spasms. 01/01/22  Yes Nyoka Lint, PA-C  Blood Pressure Monitoring (BLOOD PRESSURE KIT) DEVI 1 Device by Does not apply route as needed. 05/13/19   Constant, Peggy, MD  lamoTRIgine (LAMICTAL) 100 MG tablet Take 200 mg by mouth 2 (two) times daily. 03/11/17   [provider]  meclizine (ANTIVERT) 25 MG tablet Take 1 tablet (25 mg total) by mouth 3 (three) times daily as needed for dizziness. 01/31/21   Allen, Anthony, MD  predniSONE (STERAPRED UNI-PAK 21 TAB) 10 MG (21) TBPK tablet As directed 09/27/21   Raspet, Erin K, PA-C  Vitamin D, Ergocalciferol, (DRISDOL) 1.25 MG (50000 UNIT) CAPS capsule Take 1 capsule by mouth once a week. 10/22/20   [provider]  phentermine (ADIPEX-P) 37.5 MG tablet Take 37.5 mg by mouth every morning. Pt states she takes on as needed basis 12/14/19 09/29/20  [provider]  topiramate (TOPAMAX) 25 MG tablet Take 25 mg by mouth 2 (two) times daily.  09/06/11  [provider]    Family History Family History  Problem Relation Age of Onset   Thyroid disease Mother    Aortic aneurysm Mother        heart   Aneurysm Mother    Hypertension Maternal Aunt    Hypertension Maternal Uncle    Hypertension Maternal  Grandmother    Diabetes Maternal Grandmother    Hypertension Sister    Diabetes Sister    Asthma Daughter     Social History Social History   Tobacco Use   Smoking status: Former    Packs/day: 1.00    Types: Cigarettes   Smokeless tobacco: Never   Tobacco comments:    2 cigarettes  Vaping Use   Vaping Use: Never used  Substance Use Topics   Alcohol use: Not Currently   Drug use: No     Allergies   Hydroxacen [hydroxyzine] and Vicodin [hydrocodone-acetaminophen]   Review of Systems Review of Systems  Musculoskeletal:  Positive for back pain (left mid back).     Physical Exam Triage Vital Signs ED Triage Vitals  Enc Vitals Group     BP 01/01/22 1416 112/67     Pulse Rate 01/01/22 1416 93     Resp 01/01/22 1416 18     Temp 01/01/22 1416 98.2 F (36.8 C)     Temp Source 01/01/22 1416 Oral     SpO2 01/01/22 1416 96 %     Weight --      Height --      Head Circumference --      Peak Flow --      Pain Score 01/01/22 1414 8     Pain Loc --      Pain Edu? --      Excl. in GC? --    No data found.  Updated Vital Signs BP 112/67 (BP Location: Right Arm)   Pulse 93   Temp 98.2 F (36.8 C) (Oral)   Resp 18   SpO2 96%   Visual Acuity Right Eye Distance:   Left Eye Distance:   Bilateral Distance:    Right Eye Near:   Left Eye Near:    Bilateral Near:     Physical Exam Constitutional:      Appearance: Normal appearance.  Abdominal:     General: Abdomen is flat. Bowel sounds are normal.     Palpations: Abdomen is soft.     Tenderness: There is abdominal tenderness in the suprapubic area. There is no guarding or rebound.     Comments: LongPelvis: Right side at the ovarian area without guarding.  Musculoskeletal:     Thoracic back: Spasms (mid back at T8-T10 para spinous areas) and tenderness present. Decreased range of motion (pain with turning and reaching).     Lumbar back: Negative right straight leg   raise test and negative left straight leg raise  test.  Neurological:     Mental Status: She is alert.      UC Treatments / Results  Labs (all labs ordered are listed, but only abnormal results are displayed) Labs Reviewed  POCT URINALYSIS DIPSTICK, ED / UC  CERVICOVAGINAL ANCILLARY ONLY    EKG   Radiology No results found.  Procedures Procedures (including critical care time)  Medications Ordered in UC Medications - No data to display  Initial Impression / Assessment and Plan / UC Course  I have reviewed the triage vital signs and the nursing notes.  Pertinent labs & imaging results that were available during my care of the patient were reviewed by me and considered in my medical decision making (see chart for details).    Plan: 1.  Advised to take ibuprofen 600 mg 1 every 8 hours with food to help decrease the pain. 2.  Advised to take the Zanaflex 1 every 6-8 hours to help reduce the muscle spasm pain. 3.  Advised ice therapy, 10 minutes on 20 minutes off, 4-5 times throughout the day to help reduce the pain. 4.  Advised follow-up PCP or return to urgent care if symptoms fail to improve. Final Clinical Impressions(s) / UC Diagnoses   Final diagnoses:  Muscle spasm of back  Acute left-sided thoracic back pain  Pain in joint involving left pelvic region and thigh     Discharge Instructions      Advised to use ice to the area, 10 minutes on 20 minutes off, 4-5 times throughout the day to help reduce the pain and spasm. Advised to take the ibuprofen 600 mg 1 every 8 hours with food to help reduce the pain. Advised to take Zanaflex 1 every 6-8 hours to help reduce the muscle spasm Advised to follow-up with PCP or return to urgent care if symptoms fail to improve.    ED Prescriptions     Medication Sig Dispense Auth. Provider   ibuprofen (ADVIL) 600 MG tablet Take 1 tablet (600 mg total) by mouth 3 (three) times daily. 21 tablet Nyoka Lint, PA-C   tiZANidine (ZANAFLEX) 4 MG tablet Take 1 tablet (4 mg  total) by mouth every 6 (six) hours as needed for muscle spasms. 30 tablet Nyoka Lint, PA-C      PDMP not reviewed this encounter.   Nyoka Lint, PA-C 01/01/22 1511

## 2022-01-01 NOTE — ED Triage Notes (Addendum)
Pt reports right side pelvic pain for a few days and lower back pain beginning lastnight.  States she did have a vaginal discharge last week that has now resolved. Requesting STD testing.    Reports heavy lifting at work.

## 2022-01-02 ENCOUNTER — Telehealth (HOSPITAL_COMMUNITY): Payer: Self-pay | Admitting: Emergency Medicine

## 2022-01-02 LAB — CERVICOVAGINAL ANCILLARY ONLY
Bacterial Vaginitis (gardnerella): POSITIVE — AB
Candida Glabrata: NEGATIVE
Candida Vaginitis: POSITIVE — AB
Chlamydia: NEGATIVE
Comment: NEGATIVE
Comment: NEGATIVE
Comment: NEGATIVE
Comment: NEGATIVE
Comment: NEGATIVE
Comment: NORMAL
Neisseria Gonorrhea: NEGATIVE
Trichomonas: NEGATIVE

## 2022-01-02 MED ORDER — METRONIDAZOLE 500 MG PO TABS: 500.0000 mg | ORAL_TABLET | Freq: Two times a day (BID) | ORAL | 0 refills | Status: DC

## 2022-01-02 MED ORDER — FLUCONAZOLE 150 MG PO TABS: 150.0000 mg | ORAL_TABLET | Freq: Once | ORAL | 0 refills | Status: AC

## 2022-02-24 ENCOUNTER — Other Ambulatory Visit: Payer: Self-pay

## 2022-02-24 ENCOUNTER — Emergency Department (HOSPITAL_COMMUNITY)
Admission: EM | Admit: 2022-02-24 | Discharge: 2022-02-24 | Disposition: A | Discharge status: Home / Self Care | Payer: Medicaid Other | Attending: Emergency Medicine | Admitting: Emergency Medicine

## 2022-02-24 ENCOUNTER — Encounter (HOSPITAL_COMMUNITY): Payer: Self-pay

## 2022-02-24 DIAGNOSIS — N390 Urinary tract infection, site not specified: Secondary | ICD-10-CM | POA: Diagnosis not present

## 2022-02-24 DIAGNOSIS — J029 Acute pharyngitis, unspecified: Secondary | ICD-10-CM | POA: Insufficient documentation

## 2022-02-24 DIAGNOSIS — R0981 Nasal congestion: Secondary | ICD-10-CM | POA: Diagnosis present

## 2022-02-24 LAB — URINALYSIS, ROUTINE W REFLEX MICROSCOPIC
Bilirubin Urine: NEGATIVE
Glucose, UA: NEGATIVE mg/dL
Hgb urine dipstick: NEGATIVE
Ketones, ur: NEGATIVE mg/dL
Leukocytes,Ua: NEGATIVE
Nitrite: NEGATIVE
Protein, ur: NEGATIVE mg/dL
Specific Gravity, Urine: 1.02 (ref 1.005–1.030)
pH: 5 (ref 5.0–8.0)

## 2022-02-24 NOTE — ED Notes (Signed)
Pt refused covid test

## 2022-02-24 NOTE — ED Triage Notes (Signed)
"  Also my urine smells foul so I think I have a UTI" per pt  Denies dysuria, frequency or retention

## 2022-02-24 NOTE — ED Triage Notes (Signed)
"  Sinus infection since yesterday, sneezing, runny nose" per pt

## 2022-02-24 NOTE — Discharge Instructions (Signed)
Diagnosed with nasal congestion.  Likely related to a viral upper respiratory infection and/or allergen exposure.  At this time recommend Tylenol and ibuprofen for symptomatic relief as needed.  Also recommend follow-up with PCP regarding her symptoms. If you develop new shortness of breath, chest pain, fever please return to the emergency department for further evaluation.   In terms of your concern for UTI, urinalysis was unremarkable and did not reveal concerns for active infection.  If you start to experience painful urination, malodorous urine, and change in frequency in urination advised that you follow-up with PCP regarding the symptoms.  If you have new fever and flank pain please return to the emergency department for further evaluation.

## 2022-02-24 NOTE — ED Provider Notes (Signed)
[facility] DEPT Provider Note   CSN: [id number] Arrival date & time: 02/24/22  1201     History  Chief Complaint  Patient presents with   Nasal Congestion   HPI Hannah Cook is a 41 y.o. female with recent history of multiple UTI presenting for nasal congestion and concern for UTI.  Nasal congestion started yesterday.  It is all day long.  When she blows her nose, the mucus is clear.  Denies cough but does endorse sore throat.  No shortness of breath or chest pain.  Also denies fever.  Also mentioned that she is concerned for possible UTI because her urine has been malodorous lately.  Denies painful urination or hematuria.  Stated that she has had 4 UTIs in the last year has taken antibiotics each time.  She is concerned as to why she keeps having UTI.   HPI     Home Medications Prior to Admission medications   Medication Sig Start Date End Date Taking? Authorizing Provider  Blood Pressure Monitoring (BLOOD PRESSURE KIT) DEVI 1 Device by Does not apply route as needed. 05/13/19   Constant, Peggy, MD  ibuprofen (ADVIL) 600 MG tablet Take 1 tablet (600 mg total) by mouth 3 (three) times daily. 01/01/22   Nyoka Lint, PA-C  lamoTRIgine (LAMICTAL) 100 MG tablet Take 200 mg by mouth 2 (two) times daily. 03/11/17   [provider]  meclizine (ANTIVERT) 25 MG tablet Take 1 tablet (25 mg total) by mouth 3 (three) times daily as needed for dizziness. 01/31/21   Lacretia Leigh, MD  metroNIDAZOLE (FLAGYL) 500 MG tablet Take 1 tablet (500 mg total) by mouth 2 (two) times daily. 01/02/22   Lamptey, Myrene Galas, MD  predniSONE (STERAPRED UNI-PAK 21 TAB) 10 MG (21) TBPK tablet As directed 09/27/21   Raspet, Erin K, PA-C  tiZANidine (ZANAFLEX) 4 MG tablet Take 1 tablet (4 mg total) by mouth every 6 (six) hours as needed for muscle spasms. 01/01/22   Nyoka Lint, PA-C  Vitamin D, Ergocalciferol, (DRISDOL) 1.25 MG (50000 UNIT) CAPS capsule Take 1 capsule by mouth  once a week. 10/22/20   [provider]  phentermine (ADIPEX-P) 37.5 MG tablet Take 37.5 mg by mouth every morning. Pt states she takes on as needed basis 12/14/19 09/29/20  [provider]  topiramate (TOPAMAX) 25 MG tablet Take 25 mg by mouth 2 (two) times daily.  09/06/11  [provider]      Allergies    Hydroxacen [hydroxyzine] and Vicodin [hydrocodone-acetaminophen]    Review of Systems   Review of Systems  HENT:  Positive for congestion.   Genitourinary:        Malodorous urine    Physical Exam Updated Vital Signs BP 122/75 (BP Location: Left Arm)   Pulse 72   Temp 98.4 F (36.9 C) (Oral)   Resp 16   Ht '5\' 6"'  (1.676 m)   Wt 83.5 kg   SpO2 100%   BMI 29.70 kg/m  Physical Exam Vitals and nursing note reviewed.  HENT:     Head: Normocephalic and atraumatic.     Nose: Nose normal.     Mouth/Throat:     Mouth: Mucous membranes are moist.  Eyes:     General:        Right eye: No discharge.        Left eye: No discharge.     Conjunctiva/sclera: Conjunctivae normal.  Cardiovascular:     Rate and Rhythm: Normal rate and  regular rhythm.     Pulses: Normal pulses.     Heart sounds: Normal heart sounds.  Pulmonary:     Effort: Pulmonary effort is normal.     Breath sounds: Normal breath sounds.  Abdominal:     General: Abdomen is flat.     Palpations: Abdomen is soft.  Skin:    General: Skin is warm and dry.  Neurological:     General: No focal deficit present.  Psychiatric:        Mood and Affect: Mood normal.     ED Results / Procedures / Treatments   Labs (all labs ordered are listed, but only abnormal results are displayed) Labs Reviewed  SARS CORONAVIRUS 2 BY RT PCR  URINALYSIS, ROUTINE W REFLEX MICROSCOPIC    EKG None  Radiology No results found.  Procedures Procedures    Medications Ordered in ED Medications - No data to display  ED Course/ Medical Decision Making/ A&P                           Medical Decision  Making Amount and/or Complexity of Data Reviewed Labs: ordered.   Presenting for nasal congestion and concern for UTI.  Differential diagnosis for this complaint includes URI, pneumonia, COVID, flu, UTI, pyelonephritis.  Exam revealed normal mouth and throat.  Exam of nose was also normal could not appreciate rhinorrhea on exam.  Did consider pyelonephritis but unlikely given patient does not endorse fever nor flank pain.  UA is unremarkable and with only malodorous urine and symptoms it is unlikely that she has an active UTI at this time.  Therefore antibiotics are not warranted at this time.  Discussed that she follow-up with PCP regarding symptoms.  Refused COVID swab.  Symptoms are likely related to viral upper respiratory infection versus allergen exposure.  Discussed follow-up with PCP regarding her symptoms.  Also discussed return precautions.  Recommended rest, ibuprofen and Tylenol for symptomatic relief.        Final Clinical Impression(s) / ED Diagnoses Final diagnoses:  Nasal congestion    Rx / DC Orders ED Discharge Orders     None         Harriet Pho, PA-C 02/24/22 1426    Gareth Morgan, MD 02/25/22 2235

## 2022-05-09 ENCOUNTER — Ambulatory Visit (HOSPITAL_COMMUNITY)
Admission: EM | Admit: 2022-05-09 | Discharge: 2022-05-09 | Disposition: A | Discharge status: Home / Self Care | Payer: Medicaid Other | Attending: Family Medicine | Admitting: Family Medicine

## 2022-05-09 ENCOUNTER — Encounter (HOSPITAL_COMMUNITY): Payer: Self-pay

## 2022-05-09 DIAGNOSIS — L02412 Cutaneous abscess of left axilla: Secondary | ICD-10-CM | POA: Insufficient documentation

## 2022-05-09 DIAGNOSIS — R300 Dysuria: Secondary | ICD-10-CM

## 2022-05-09 LAB — POCT URINALYSIS DIPSTICK, ED / UC
Glucose, UA: NEGATIVE mg/dL
Hgb urine dipstick: NEGATIVE
Ketones, ur: 15 mg/dL — AB
Leukocytes,Ua: NEGATIVE
Nitrite: NEGATIVE
Protein, ur: NEGATIVE mg/dL
Specific Gravity, Urine: 1.025 (ref 1.005–1.030)
Urobilinogen, UA: 0.2 mg/dL (ref 0.0–1.0)
pH: 6 (ref 5.0–8.0)

## 2022-05-09 MED ORDER — HYDROCODONE-ACETAMINOPHEN 5-325 MG PO TABS: 1.0000 | ORAL_TABLET | Freq: Four times a day (QID) | ORAL | 0 refills | Status: DC | PRN

## 2022-05-09 MED ORDER — DOXYCYCLINE HYCLATE 100 MG PO CAPS: 100.0000 mg | ORAL_CAPSULE | Freq: Two times a day (BID) | ORAL | 0 refills | Status: DC

## 2022-05-09 NOTE — Discharge Instructions (Addendum)
Be aware, you have been prescribed pain medications that may cause drowsiness. While taking this medication, do not take any other medications containing acetaminophen (Tylenol). Do not combine with alcohol or recreational drugs. Please do not drive, operate heavy machinery, or take part in activities that require making important decisions while on this medication as your judgement may be clouded.  

## 2022-05-09 NOTE — ED Triage Notes (Signed)
Chief Complaint: Abcess under left arm. States she gets skin infection a lot. Urinary odor as well, states she is prone to UTIs.   Onset: Abcess 2 days. UTI 3 days  OTC medications tried: No    Sick exposure: No  New foods or medications: No  Recent Travel: No

## 2022-05-10 LAB — CERVICOVAGINAL ANCILLARY ONLY
Bacterial Vaginitis (gardnerella): POSITIVE — AB
Candida Glabrata: NEGATIVE
Candida Vaginitis: NEGATIVE
Chlamydia: NEGATIVE
Comment: NEGATIVE
Comment: NEGATIVE
Comment: NEGATIVE
Comment: NEGATIVE
Comment: NEGATIVE
Comment: NORMAL
Neisseria Gonorrhea: NEGATIVE
Trichomonas: NEGATIVE

## 2022-05-14 ENCOUNTER — Telehealth (HOSPITAL_COMMUNITY): Payer: Self-pay | Admitting: Emergency Medicine

## 2022-05-14 MED ORDER — METRONIDAZOLE 500 MG PO TABS: 500.0000 mg | ORAL_TABLET | Freq: Two times a day (BID) | ORAL | 0 refills | Status: DC

## 2022-05-15 NOTE — ED Provider Notes (Addendum)
[facility]   [id number] 05/09/22 Arrival Time: 1726  ASSESSMENT & PLAN:  1. Abscess of axilla, left     Incision and Drainage Procedure Note  Anesthesia: 1% plain lidocaine  Procedure Details  The procedure, risks and complications have been discussed in detail (including, but not limited to pain and bleeding) with the patient.  The skin induration was prepped and draped in the usual fashion. After adequate local anesthesia, I&D with a #11 blade was performed on the left axilla with copious, purulent drainage.  EBL: minimal Drains: none Packing: n/a Condition: Tolerated procedure well Complications: none.  Meds ordered this encounter  Medications   doxycycline (VIBRAMYCIN) 100 MG capsule    Sig: Take 1 capsule (100 mg total) by mouth 2 (two) times daily.    Dispense:  14 capsule    Refill:  0   HYDROcodone-acetaminophen (NORCO/VICODIN) 5-325 MG tablet    Sig: Take 1 tablet by mouth every 6 (six) hours as needed for moderate pain or severe pain.    Dispense:  4 tablet    Refill:  0   Wound care instructions discussed and given in written format. To return in 48 hours for wound check if needed.  Finish all antibiotics. OTC analgesics as needed.  Labs Reviewed  POCT URINALYSIS DIPSTICK, ED / UC - Abnormal; Notable for the following components:      Result Value   Bilirubin Urine SMALL (*)    Ketones, ur 15 (*)    All other components within normal limits  CERVICOVAGINAL ANCILLARY ONLY - Pending       Reviewed expectations re: course of current medical issues. Questions answered. Outlined signs and symptoms indicating need for more acute intervention. Patient verbalized understanding. After Visit Summary given.   SUBJECTIVE:  Hannah Cook is a 41 y.o. female who presents with a possible infection of her L axilla. Onset gradual, approximately a few days ago without active drainage and without active bleeding. Symptoms have  worsened; increasing  pain,  since beginning. Fever: absent. OTC/home treatment: none.  Also desires STD treatment. Ques mild urinary symptoms also; dysuria; x 3 days.  OBJECTIVE:  Vitals:   05/09/22 1832  BP: 108/68  Pulse: 93  Resp: 16  Temp: 98.2 F (36.8 C)  TempSrc: Oral  SpO2: 100%     General appearance: alert; no distress GU: deferred LUE: approx 1x1.5 cm induration of her L axilla; tender to touch; no active drainage or bleeding Psychological: alert and cooperative; normal mood and affect  Allergies  Allergen Reactions   Hydroxacen [Hydroxyzine] Itching and Nausea And Vomiting   Vicodin [Hydrocodone-Acetaminophen] Itching, Nausea And Vomiting and Other (See Comments)    headaches    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   Thyroid disease    Social History   Socioeconomic History   Marital status: Single    Spouse name: Not on file   Number of children: Not on file   Years of education: Not on file   Highest education level: Not on file  Occupational History   Not on file  Tobacco Use   Smoking status: Former    Packs/day: 1.00    Types: Cigarettes   Smokeless tobacco: Never   Tobacco comments:    2 cigarettes  Vaping Use   Vaping Use: Never  used  Substance and Sexual Activity   Alcohol use: Not Currently   Drug use: No   Sexual activity: Yes    Birth control/protection: Condom  Other Topics Concern   Not on file  Social History Narrative   Not on file   Social Determinants of Health   Financial Resource Strain: Not on file  Food Insecurity: No Food Insecurity (05/13/2019)   Hunger Vital Sign    Worried About Running Out of Food in the Last Year: Never true    Ran Out of Food in the Last Year: Never true  Transportation Needs: No Transportation Needs (05/13/2019)   PRAPARE - Administrator, Civil Service  (Medical): No    Lack of Transportation (Non-Medical): No  Physical Activity: Not on file  Stress: Not on file  Social Connections: Not on file   Family History  Problem Relation Age of Onset   Thyroid disease Mother    Aortic aneurysm Mother        heart   Aneurysm Mother    Hypertension Maternal Aunt    Hypertension Maternal Uncle    Hypertension Maternal Grandmother    Diabetes Maternal Grandmother    Hypertension Sister    Diabetes Sister    Asthma Daughter    Past Surgical History:  Procedure Laterality Date   ANEURYSM [facility]     BRAIN SURGERY     TONSILLECTOMY              Mardella Layman, MD 05/15/22 1009    Mardella Layman, MD 05/21/22 1337

## 2022-08-09 ENCOUNTER — Other Ambulatory Visit: Payer: Self-pay | Admitting: Internal Medicine

## 2022-08-10 LAB — CBC
HCT: 38.7 % (ref 35.0–45.0)
Hemoglobin: 13.4 g/dL (ref 11.7–15.5)
MCH: 32.5 pg (ref 27.0–33.0)
MCHC: 34.6 g/dL (ref 32.0–36.0)
MCV: 93.9 fL (ref 80.0–100.0)
MPV: 9.6 fL (ref 7.5–12.5)
Platelets: 259 10*3/uL (ref 140–400)
RBC: 4.12 10*6/uL (ref 3.80–5.10)
RDW: 12.9 % (ref 11.0–15.0)
WBC: 5.8 10*3/uL (ref 3.8–10.8)

## 2022-08-10 LAB — COMPLETE METABOLIC PANEL WITH GFR
AG Ratio: 1.4 (calc) (ref 1.0–2.5)
ALT: 15 U/L (ref 6–29)
AST: 18 U/L (ref 10–30)
Albumin: 3.9 g/dL (ref 3.6–5.1)
Alkaline phosphatase (APISO): 78 U/L (ref 31–125)
BUN: 9 mg/dL (ref 7–25)
CO2: 26 mmol/L (ref 20–32)
Calcium: 8.8 mg/dL (ref 8.6–10.2)
Chloride: 106 mmol/L (ref 98–110)
Creat: 0.69 mg/dL (ref 0.50–0.99)
Globulin: 2.7 g/dL (calc) (ref 1.9–3.7)
Glucose, Bld: 76 mg/dL (ref 65–99)
Potassium: 4 mmol/L (ref 3.5–5.3)
Sodium: 139 mmol/L (ref 135–146)
Total Bilirubin: 0.4 mg/dL (ref 0.2–1.2)
Total Protein: 6.6 g/dL (ref 6.1–8.1)
eGFR: 112 mL/min/{1.73_m2} (ref >=60)

## 2022-08-10 LAB — LIPID PANEL
Cholesterol: 140 mg/dL (ref <200)
HDL: 54 mg/dL (ref >=50)
LDL Cholesterol (Calc): 73 mg/dL (calc)
Non-HDL Cholesterol (Calc): 86 mg/dL (calc) (ref <130)
Total CHOL/HDL Ratio: 2.6 (calc) (ref <5.0)
Triglycerides: 57 mg/dL (ref <150)

## 2022-08-10 LAB — VITAMIN B12: Vitamin B-12: 536 pg/mL (ref 200–1100)

## 2022-08-10 LAB — VITAMIN D 25 HYDROXY (VIT D DEFICIENCY, FRACTURES): Vit D, 25-Hydroxy: 39 ng/mL (ref 30–100)

## 2022-08-10 LAB — FOLATE: Folate: 14.2 ng/mL

## 2022-08-10 LAB — TSH: TSH: 0.49 mIU/L

## 2022-11-15 DIAGNOSIS — G5603 Carpal tunnel syndrome, bilateral upper limbs: Secondary | ICD-10-CM | POA: Insufficient documentation

## 2022-11-15 DIAGNOSIS — M79641 Pain in right hand: Secondary | ICD-10-CM | POA: Insufficient documentation

## 2022-11-15 DIAGNOSIS — G5620 Lesion of ulnar nerve, unspecified upper limb: Secondary | ICD-10-CM | POA: Insufficient documentation

## 2022-11-15 DIAGNOSIS — G5602 Carpal tunnel syndrome, left upper limb: Secondary | ICD-10-CM | POA: Insufficient documentation

## 2022-12-03 ENCOUNTER — Ambulatory Visit (HOSPITAL_COMMUNITY)
Admission: EM | Admit: 2022-12-03 | Discharge: 2022-12-03 | Disposition: A | Discharge status: Home / Self Care | Payer: Medicaid Other | Attending: Internal Medicine | Admitting: Internal Medicine

## 2022-12-03 ENCOUNTER — Encounter (HOSPITAL_COMMUNITY): Payer: Self-pay

## 2022-12-03 DIAGNOSIS — R519 Headache, unspecified: Secondary | ICD-10-CM | POA: Insufficient documentation

## 2022-12-03 DIAGNOSIS — N760 Acute vaginitis: Secondary | ICD-10-CM | POA: Diagnosis present

## 2022-12-03 DIAGNOSIS — Z8679 Personal history of other diseases of the circulatory system: Secondary | ICD-10-CM | POA: Insufficient documentation

## 2022-12-03 DIAGNOSIS — G5603 Carpal tunnel syndrome, bilateral upper limbs: Secondary | ICD-10-CM | POA: Diagnosis present

## 2022-12-03 LAB — POCT URINALYSIS DIP (MANUAL ENTRY)
Bilirubin, UA: NEGATIVE
Blood, UA: NEGATIVE
Glucose, UA: NEGATIVE mg/dL
Ketones, POC UA: NEGATIVE mg/dL
Leukocytes, UA: NEGATIVE
Nitrite, UA: NEGATIVE
Protein Ur, POC: NEGATIVE mg/dL
Spec Grav, UA: 1.025 (ref 1.010–1.025)
Urobilinogen, UA: 0.2 E.U./dL
pH, UA: 6 (ref 5.0–8.0)

## 2022-12-03 LAB — POCT URINE PREGNANCY: Preg Test, Ur: NEGATIVE

## 2022-12-03 NOTE — ED Provider Notes (Signed)
[facility]    CSN: [id number] Arrival date & time: 12/03/22  1351    HISTORY   Chief Complaint  Patient presents with   Urinary Frequency   HPI Hannah Cook is a pleasant, 42 y.o. female who presents to urgent care today. Patient complains of increased urinary frequency and abnormal vaginal odor for the past 2 days.  Patient states she is history of frequent episodes of bacterial vaginitis.  Patient complains of bilateral wrist pain and known carpal tunnel syndrome, left wrist greater than right.  Patient states she was supposed to have surgery 10 days ago but because she is going to [employer], postponed her procedure because she did not want to have to wear the postop brace while at [employer].  Patient reports headache that she rates 9 out of 10 on the pain scale accompanied by light sensitivity for the past 2 to 3 days.  Patient states she is a patient at [facility] neurolog[facility] for headaches and has an upcoming appointment with them on December 13, 2022.  Patient states that her headache at this time is much better.  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  Patient had a similar episode in 2022.  Patient was last seen by her neurologist at the beginning of May 2024, per encounter note patient was advised to go to the emergency room anytime she has severe headaches.  The history is provided by the patient.   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   Thyroid disease    Patient Active Problem List   Diagnosis Date Noted   Supervision of high risk pregnancy, antepartum 05/13/2019   AMA (advanced maternal age) multigravida 35+ 05/13/2019   Seizure disorder during pregnancy (HCC) 05/13/2019   Vaginal bleeding in pregnancy, third trimester 04/08/2017   Tobacco smoking complicating pregnancy  01/30/2017   History of intracranial aneurysm s/p coiling 11/05/2016   Obesity (BMI 30.0-34.9) 02/16/2014   Anxiety 03/10/2012   Past Surgical History:  Procedure Laterality Date   ANEURYSM COILING     BRAIN SURGERY     TONSILLECTOMY     OB History     Gravida  9   Para  4   Term  4   Preterm      AB  4   Living  4      SAB  1   IAB  2   Ectopic  0   Multiple  0   Live Births  4          Home Medications    Prior to Admission medications   Medication Sig Start Date End Date Taking? Authorizing Provider  furosemide (LASIX) 20 MG tablet Take 20 mg by mouth daily as needed.   Yes [provider]  lamoTRIgine (LAMICTAL) 100 MG tablet Take 200 mg by mouth 2 (two) times daily. 03/11/17  Yes [provider]  phentermine (ADIPEX-P) 37.5 MG tablet Take 37.5 mg by mouth every morning.   Yes [provider]  Vitamin D, Ergocalciferol, (DRISDOL) 1.25 MG (50000 UNIT) CAPS capsule Take 1 capsule by mouth once a week. 10/22/20  Yes [provider]  Blood Pressure Monitoring (BLOOD PRESSURE KIT) DEVI 1 Device by Does not apply route as needed. 05/13/19  Constant, Peggy, MD  topiramate (TOPAMAX) 25 MG tablet Take 25 mg by mouth 2 (two) times daily.  09/06/11  [provider]    Family History Family History  Problem Relation Age of Onset   Thyroid disease Mother    Aortic aneurysm Mother        heart   Aneurysm Mother    Hypertension Maternal Aunt    Hypertension Maternal Uncle    Hypertension Maternal Grandmother    Diabetes Maternal Grandmother    Hypertension Sister    Diabetes Sister    Asthma Daughter    Social History Social History   Tobacco Use   Smoking status: Former    Packs/day: 1    Types: Cigarettes   Smokeless tobacco: Never   Tobacco comments:    2 cigarettes  Vaping Use   Vaping Use: Never used  Substance Use Topics   Alcohol use: Not Currently   Drug use: No   Allergies   Hydroxacen  [hydroxyzine] and Vicodin [hydrocodone-acetaminophen]  Review of Systems Review of Systems Pertinent findings revealed after performing a 14 point review of systems has been noted in the history of present illness.  Physical Exam Vital Signs BP 108/67 (BP Location: Left Arm)   Pulse (!) 101   Temp 98.1 F (36.7 C) (Oral)   Resp 16   Ht 5\' 6"  (1.676 m)   Wt 180 lb (81.6 kg)   LMP 11/16/2022 (Exact Date)   SpO2 94%   BMI 29.05 kg/m   No data found.  Physical Exam Vitals and nursing note reviewed.  Constitutional:      General: She is not in acute distress.    Appearance: Normal appearance. She is not ill-appearing.  HENT:     Head: Normocephalic and atraumatic.  Eyes:     General: Lids are normal.        Right eye: No discharge.        Left eye: No discharge.     Extraocular Movements: Extraocular movements intact.     Conjunctiva/sclera: Conjunctivae normal.     Right eye: Right conjunctiva is not injected.     Left eye: Left conjunctiva is not injected.     Pupils: Pupils are equal, round, and reactive to light.  Neck:     Trachea: Trachea and phonation normal.  Cardiovascular:     Rate and Rhythm: Normal rate and regular rhythm.     Pulses: Normal pulses.     Heart sounds: Normal heart sounds. No murmur heard.    No friction rub. No gallop.  Pulmonary:     Effort: Pulmonary effort is normal. No accessory muscle usage, prolonged expiration or respiratory distress.     Breath sounds: Normal breath sounds. No stridor, decreased air movement or transmitted upper airway sounds. No decreased breath sounds, wheezing, rhonchi or rales.  Chest:     Chest wall: No tenderness.  Abdominal:     General: Abdomen is flat. Bowel sounds are normal. There is no distension.     Palpations: Abdomen is soft.     Tenderness: There is no abdominal tenderness. There is no right CVA tenderness or left CVA tenderness.     Hernia: No hernia is present.  Genitourinary:    Comments:  Patient politely declines pelvic exam today, patient provided a vaginal swab for testing. Musculoskeletal:        General: Normal range of motion.     Right wrist: Tenderness present. No swelling, deformity, effusion, lacerations, bony tenderness,  snuff box tenderness or crepitus. Normal range of motion. Normal pulse.     Left wrist: Tenderness present. No swelling, deformity, effusion, lacerations, bony tenderness, snuff box tenderness or crepitus. Normal range of motion. Normal pulse.     Cervical back: Normal range of motion and neck supple. Normal range of motion.     Comments: Positive Phalen and Tinel bilaterally  Lymphadenopathy:     Cervical: No cervical adenopathy.  Skin:    General: Skin is warm and dry.     Findings: No erythema or rash.  Neurological:     General: No focal deficit present.     Mental Status: She is alert and oriented to person, place, and time. Mental status is at baseline.     Cranial Nerves: Cranial nerves 2-12 are intact.     Sensory: Sensation is intact.     Motor: Motor function is intact.     Coordination: Coordination is intact.     Gait: Gait is intact.     Deep Tendon Reflexes: Reflexes are normal and symmetric.  Psychiatric:        Mood and Affect: Mood normal.        Behavior: Behavior normal.     Visual Acuity Right Eye Distance:   Left Eye Distance:   Bilateral Distance:    Right Eye Near:   Left Eye Near:    Bilateral Near:     UC Couse / Diagnostics / Procedures:     Radiology No results found.  Procedures Procedures (including critical care time) EKG  Pending results:  Labs Reviewed  POCT URINALYSIS DIP (MANUAL ENTRY)  POCT URINE PREGNANCY  CERVICOVAGINAL ANCILLARY ONLY    Medications Ordered in UC: Medications - No data to display  UC Diagnoses / Final Clinical Impressions(s)   I have reviewed the triage vital signs and the nursing notes.  Pertinent labs & imaging results that were available during my care of the  patient were reviewed by me and considered in my medical decision making (see chart for details).    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  Patient advised that results of vaginal swab testing will be available in 2 to 3 days and treatment will be provided as needed patient that result.  Patient provided with bilateral wrist braces for her carpal tunnel syndrome while she is on vacation to [employer], patient states she plans to reschedule her surgery for when she returns.  Patient advised that if her headache returns she should go to the emergency room immediately due to high risk of bleeding given her history of spontaneous rupture of arteriovenous malformation in the past.  Please see discharge instructions below for details of plan of care as provided to patient. ED Prescriptions   None    PDMP not reviewed this encounter.  Pending results:  Labs Reviewed  POCT URINALYSIS DIP (MANUAL ENTRY)  POCT URINE PREGNANCY  CERVICOVAGINAL ANCILLARY ONLY    Discharge Instructions:   Discharge Instructions      Please go to the emergency room if you experience another headache as intense as the one you had 2 to 3 days ago.  We are happy to provide you with respirations for your bilateral carpal tunnel syndrome.  Your urinalysis  did not reveal any concern for urinary tract infection.  The results of your vaginal swab test will be available in the next 2 to 3 days, you will be contacted by phone and prescriptions are needed for any treatment, they will be provided for you.  Thank you for visiting [facility] today.    Disposition Upon  Discharge:  Condition: stable for discharge home  Patient presented with an acute illness with associated systemic symptoms and significant discomfort requiring urgent management. In my opinion, this is a condition that a prudent lay person (someone who possesses an average knowledge of health and medicine) may potentially expect to result in complications if not addressed urgently such as respiratory distress, impairment of bodily function or dysfunction of bodily organs.   Routine symptom specific, illness specific and/or disease specific instructions were discussed with the patient and/or caregiver at length.   As such, the patient has been evaluated and assessed, work-up was performed and treatment was provided in alignment with urgent care protocols and evidence based medicine.  Patient/parent/caregiver has been advised that the patient may require follow up for further testing and treatment if the symptoms continue in spite of treatment, as clinically indicated and appropriate.  Patient/parent/caregiver has been advised to return to the [facility] or PCP if no better; to PCP or the Emergency Department if new signs and symptoms develop, or if the current signs or symptoms continue to change or worsen for further workup, evaluation and treatment as clinically indicated and appropriate  The patient will follow up with their current PCP if and as advised. If the patient does not currently have a PCP we will assist them in obtaining one.   The patient may need specialty follow up if the symptoms continue, in spite of conservative treatment and management, for further workup, evaluation, consultation and treatment as clinically indicated and appropriate.  Patient/parent/caregiver verbalized understanding and agreement of plan as discussed.  All questions were addressed during visit.  Please see discharge instructions below for further details of plan.  This office note has been dictated using Paediatric nurse.  Unfortunately, this method of dictation can sometimes lead to typographical or grammatical errors.  I apologize for your inconvenience in advance if this occurs.  Please do not hesitate to reach out to me if clarification is needed.      Theadora Rama Scales, PA-C 12/03/22 1605

## 2022-12-03 NOTE — Discharge Instructions (Signed)
Please go to the emergency room if you experience another headache as intense as the one you had 2 to 3 days ago.  We are happy to provide you with respirations for your bilateral carpal tunnel syndrome.  Your urinalysis did not reveal any concern for urinary tract infection.  The results of your vaginal swab test will be available in the next 2 to 3 days, you will be contacted by phone and prescriptions are needed for any treatment, they will be provided for you.  Thank you for visiting [facility] today.

## 2022-12-03 NOTE — ED Triage Notes (Signed)
Patient here today with c/o with urinary frequency and vaginal odor X 2 days.   She also has a left wrist pain and asked if she could have a brace. She was told that she needed to have surgery for carpel tunnel.   She is also having a headache and light sensitivity X 2-3 days. She goes to [facility] for her headaches and has an appointment in 10 days. H/o aneurysm.

## 2022-12-04 LAB — CERVICOVAGINAL ANCILLARY ONLY
Bacterial Vaginitis (gardnerella): POSITIVE — AB
Candida Glabrata: POSITIVE — AB
Candida Vaginitis: POSITIVE — AB
Chlamydia: NEGATIVE
Comment: NEGATIVE
Comment: NEGATIVE
Comment: NEGATIVE
Comment: NEGATIVE
Comment: NEGATIVE
Comment: NORMAL
Neisseria Gonorrhea: NEGATIVE
Trichomonas: NEGATIVE

## 2022-12-05 ENCOUNTER — Telehealth (HOSPITAL_COMMUNITY): Payer: Self-pay | Admitting: Emergency Medicine

## 2022-12-05 MED ORDER — METRONIDAZOLE 500 MG PO TABS: 500.0000 mg | ORAL_TABLET | Freq: Two times a day (BID) | ORAL | 0 refills | Status: DC

## 2022-12-05 MED ORDER — FLUCONAZOLE 150 MG PO TABS: 150.0000 mg | ORAL_TABLET | Freq: Once | ORAL | 0 refills | Status: AC

## 2023-01-07 ENCOUNTER — Encounter (HOSPITAL_COMMUNITY): Payer: Self-pay | Admitting: *Deleted

## 2023-01-07 ENCOUNTER — Inpatient Hospital Stay (HOSPITAL_COMMUNITY)
Admission: AD | Admit: 2023-01-07 | Discharge: 2023-01-07 | Discharge status: Against Medical Advice | Payer: Medicaid Other | Attending: Family Medicine | Admitting: Family Medicine

## 2023-01-07 DIAGNOSIS — R1030 Lower abdominal pain, unspecified: Secondary | ICD-10-CM | POA: Diagnosis present

## 2023-01-07 DIAGNOSIS — Z3A00 Weeks of gestation of pregnancy not specified: Secondary | ICD-10-CM | POA: Diagnosis not present

## 2023-01-07 DIAGNOSIS — O26899 Other specified pregnancy related conditions, unspecified trimester: Secondary | ICD-10-CM | POA: Insufficient documentation

## 2023-01-07 HISTORY — DX: Urinary tract infection, site not specified: N39.0

## 2023-01-07 LAB — URINALYSIS, ROUTINE W REFLEX MICROSCOPIC
Bilirubin Urine: NEGATIVE
Glucose, UA: NEGATIVE mg/dL
Hgb urine dipstick: NEGATIVE
Ketones, ur: NEGATIVE mg/dL
Leukocytes,Ua: NEGATIVE
Nitrite: NEGATIVE
Protein, ur: NEGATIVE mg/dL
Specific Gravity, Urine: 1.015 (ref 1.005–1.030)
pH: 5 (ref 5.0–8.0)

## 2023-01-07 LAB — POCT PREGNANCY, URINE: Preg Test, Ur: POSITIVE — AB

## 2023-01-07 NOTE — MAU Note (Signed)
Hannah Cook is a 42 y.o. at Unknown here in MAU reporting: been having cramping in lower abd.  Started yesterday, was severe- today is mild.  No mild.   LMP: 6/1 Onset of complaint: yesterday Pain score: mild Vitals:   01/07/23 1409  BP: 109/72  Pulse: 76  Resp: 16  Temp: 98.1 F (36.7 C)  SpO2: 98%      Lab orders placed from triage:

## 2023-01-08 ENCOUNTER — Inpatient Hospital Stay (HOSPITAL_COMMUNITY): Payer: Medicaid Other

## 2023-01-08 ENCOUNTER — Encounter (HOSPITAL_COMMUNITY): Payer: Self-pay | Admitting: Obstetrics & Gynecology

## 2023-01-08 ENCOUNTER — Inpatient Hospital Stay (HOSPITAL_COMMUNITY)
Admission: AD | Admit: 2023-01-08 | Discharge: 2023-01-08 | Disposition: A | Discharge status: Home / Self Care | Payer: Medicaid Other | Attending: Obstetrics & Gynecology | Admitting: Obstetrics & Gynecology

## 2023-01-08 DIAGNOSIS — O26891 Other specified pregnancy related conditions, first trimester: Secondary | ICD-10-CM | POA: Diagnosis not present

## 2023-01-08 DIAGNOSIS — Z3A01 Less than 8 weeks gestation of pregnancy: Secondary | ICD-10-CM | POA: Insufficient documentation

## 2023-01-08 DIAGNOSIS — O09521 Supervision of elderly multigravida, first trimester: Secondary | ICD-10-CM | POA: Diagnosis not present

## 2023-01-08 DIAGNOSIS — Z6790 Unspecified blood type, Rh positive: Secondary | ICD-10-CM | POA: Diagnosis not present

## 2023-01-08 DIAGNOSIS — O3680X0 Pregnancy with inconclusive fetal viability, not applicable or unspecified: Secondary | ICD-10-CM | POA: Insufficient documentation

## 2023-01-08 LAB — HCG, QUANTITATIVE, PREGNANCY: hCG, Beta Chain, Quant, S: 795 m[IU]/mL — ABNORMAL HIGH (ref <5)

## 2023-01-08 NOTE — MAU Note (Signed)
Ultrasonographer calling Radiologist to request [place] to be read.

## 2023-01-08 NOTE — MAU Provider Note (Signed)
Faculty Practice OB/GYN Attending MAU Note  Chief Complaint: Abdominal Pain    Event Date/Time   First Provider Initiated Contact with Patient 01/08/23 1452      SUBJECTIVE Hannah Cook is a 42 y.o. [id number] at [redacted]w[redacted]d by LMP who presents with lower abdominal cramping, bilaterally. Worse yesterday. Denies vaginal bleeding. No h/o ectopic pregnancy. 7 weeks by dates, but cycles are irregular.  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   Thyroid disease    UTI (urinary tract infection)    OB History  Gravida Para Term Preterm AB Living  10 4 4   5 4   SAB IAB Ectopic Multiple Live Births  1 3 0 0 4    # Outcome Date GA Lbr Len/2nd Weight Sex Type Anes PTL Lv  10 Current           9 AB 08/08/19 [redacted]w[redacted]d         8 Term 04/08/17 [redacted]w[redacted]d 17:32 / 00:15 3481 g F Vag-Spont EPI  LIV  7 IAB 2016          6 SAB 04/2014          5 Term 12/20/13 [redacted]w[redacted]d 20:01 / 02:00 3425 g M Vag-Spont EPI  LIV  4 Term 02/17/01 [redacted]w[redacted]d  2863 g F Vag-Spont   LIV     Birth Comments: no complications, born at [facility]  3 Term 01/19/00 [redacted]w[redacted]d  2920 g M Vag-Spont EPI  LIV     Birth Comments: no complications, born [facility]  2 IAB           1 IAB            Past Surgical History:  Procedure Laterality Date   ANEURYSM COILING     BRAIN SURGERY     TONSILLECTOMY     Social History   Socioeconomic History   Marital status: Single    Spouse name: Not on file   Number of children: Not on file   Years of education: Not on file   Highest education level: Not on file  Occupational History   Not on file  Tobacco Use   Smoking status: Former    Current packs/day: 1.00    Types: Cigarettes   Smokeless tobacco: Never   Tobacco comments:    2 cigarettes  Vaping Use   Vaping status: Never Used  Substance and Sexual Activity   Alcohol use: Not Currently   Drug  use: No   Sexual activity: Yes    Birth control/protection: Condom  Other Topics Concern   Not on file  Social History Narrative   Not on file   Social Determinants of Health   Financial Resource Strain: Not on file  Food Insecurity: No Food Insecurity (05/13/2019)   Hunger Vital Sign    Worried About Running Out of Food in the Last Year: Never true    Ran Out of Food in the Last Year: Never true  Transportation Needs: No Transportation Needs (05/13/2019)   PRAPARE - Administrator, Civil Service (Medical): No    Lack of Transportation (Non-Medical): No  Physical Activity: Not on file  Stress: Not on file  Social Connections: Not on file  Intimate Partner Violence: Not on file   No current facility-administered medications on  file prior to encounter.   Current Outpatient Medications on File Prior to Encounter  Medication Sig Dispense Refill   Blood Pressure Monitoring (BLOOD PRESSURE KIT) DEVI 1 Device by Does not apply route as needed. 1 each 0   furosemide (LASIX) 20 MG tablet Take 20 mg by mouth daily as needed. Makes her feel sick, so she does take it regular     lamoTRIgine (LAMICTAL) 100 MG tablet Take 200 mg by mouth 2 (two) times daily.  1   metroNIDAZOLE (FLAGYL) 500 MG tablet Take 1 tablet (500 mg total) by mouth 2 (two) times daily. 14 tablet 0   phentermine (ADIPEX-P) 37.5 MG tablet Take 37.5 mg by mouth every morning.     Vitamin D, Ergocalciferol, (DRISDOL) 1.25 MG (50000 UNIT) CAPS capsule Take 1 capsule by mouth once a week.     [DISCONTINUED] topiramate (TOPAMAX) 25 MG tablet Take 25 mg by mouth 2 (two) times daily.     Allergies  Allergen Reactions   Hydroxacen [Hydroxyzine] Itching and Nausea And Vomiting    Denies problems   Vicodin [Hydrocodone-Acetaminophen] Itching, Nausea And Vomiting and Other (See Comments)    Headaches  Pt denies allergy, unable to remove    ROS: Pertinent items in HPI  OBJECTIVE BP 119/72 (BP Location: Right Arm)    Pulse 91   Temp 98 F (36.7 C) (Oral)   Resp 16   Ht 5\' 6"  (1.676 m)   Wt 95 kg   LMP 11/17/2022   SpO2 99%   BMI 33.80 kg/m  CONSTITUTIONAL: Well-developed, well-nourished female in no acute distress.  HENT:  Normocephalic, atraumatic, External right and left ear normal. Oropharynx is clear and moist EYES: Conjunctivae and EOM are normal.  No scleral icterus.  NECK: Normal range of motion, supple, no masses.  Normal thyroid.  SKIN: Skin is warm and dry. No rash noted. Not diaphoretic. No erythema. No pallor. NEUROLGIC: Alert and oriented to person, place, and time.  CARDIOVASCULAR: Normal heart rate noted RESPIRATORY: Effort and breath sounds normal, no problems with respiration noted. ABDOMEN: Soft, normal bowel sounds, no distention noted.  No tenderness, rebound or guarding.  MUSCULOSKELETAL: Normal range of motion. No tenderness.  No cyanosis, clubbing, or edema.    LAB RESULTS Results for orders placed or performed during the hospital encounter of 01/08/23 (from the past 48 hour(s))  hCG, quantitative, pregnancy     Status: Abnormal   Collection Time: 01/08/23  2:43 PM  Result Value Ref Range   hCG, Beta Chain, Quant, S 795 (H) <5 mIU/mL    Comment:          GEST. AGE      CONC.  (mIU/mL)   <=1 WEEK        5 - 50     2 WEEKS       50 - 500     3 WEEKS       100 - 10,000     4 WEEKS     1,000 - 30,000     5 WEEKS     3,500 - 115,000   6-8 WEEKS     12,000 - 270,000    12 WEEKS     15,000 - 220,000        FEMALE AND NON-PREGNANT FEMALE:     LESS THAN 5 mIU/mL Performed at [facility], [address], [place], [place] [address]     IMAGING US OB LESS THAN 14 WEEKS WITH OB TRANSVAGINAL  Result Date: 01/08/2023 CLINICAL DATA:  Pregnancy of unknown location. EXAM: OBSTETRIC <14 WK [place] AND TRANSVAGINAL OB US TECHNIQUE: Both transabdominal and transvaginal ultrasound examinations were performed for complete evaluation of the gestation as well as the maternal uterus,  adnexal regions, and pelvic cul-de-sac. Transvaginal technique was performed to assess early pregnancy. COMPARISON:  None Available. FINDINGS: Intrauterine gestational sac: Single Yolk sac:  Not Visualized. Embryo:  Not Visualized. Cardiac Activity: Not Visualized. Heart Rate: NA  bpm MSD: 4.5 mm   5 w   1 d Subchorionic hemorrhage:  None visualized. Maternal uterus/adnexae: Right ovary: Normal Left ovary: Normal Other :None Free fluid:  None IMPRESSION: Probable early intrauterine gestational sac, but no yolk sac, fetal pole, or cardiac activity yet visualized. Recommend follow-up quantitative B-HCG levels and follow-up US in 14 days to confirm and assess viability. This recommendation follows SRU consensus guidelines: Diagnostic Criteria for Nonviable Pregnancy Early in the First Trimester. Malva Limes Med 2013; 161:0960-45. Electronically Signed   By: Signa Kell M.D.   On: 01/08/2023 16:37    MAU COURSE HCG done U/S done, images independently reviewed - shows thickened stripe, with possible early IUGS, no yolk sac, no fetal pole. Normal adnexa, no free fluid.  ASSESSMENT 1. Pregnancy of unknown anatomic location   2. Blood type, Rh positive   3. Abdominal pain during pregnancy in first trimester     PLAN Discharge home F/u HCG in 48 hours Ectopic precautions reviewed.   Follow-up Information     [facility] Follow up.   Specialty: Obstetrics and Gynecology Contact information: [address] [place] [address] [phone]               Allergies as of 01/08/2023       Reactions   Hydroxacen [hydroxyzine] Itching, Nausea And Vomiting   Denies problems   Vicodin [hydrocodone-acetaminophen] Itching, Nausea And Vomiting, Other (See Comments)   Headaches Pt denies allergy, unable to remove        Medication List     STOP taking these medications    furosemide 20 MG tablet Commonly known as:  LASIX   phentermine 37.5 MG tablet Commonly known as: ADIPEX-P       TAKE these medications    Blood Pressure Kit Devi 1 Device by Does not apply route as needed.   lamoTRIgine 100 MG tablet Commonly known as: LAMICTAL Take 200 mg by mouth 2 (two) times daily.   metroNIDAZOLE 500 MG tablet Commonly known as: FLAGYL Take 1 tablet (500 mg total) by mouth 2 (two) times daily.   Vitamin D (Ergocalciferol) 1.25 MG (50000 UNIT) Caps capsule Commonly known as: DRISDOL Take 1 capsule by mouth once a week.        Evaluation does not show pathology that would require ongoing emergent intervention or inpatient treatment. Patient is hemodynamically stable and mentating appropriately. Discussed findings and plan with patient, who agrees with care plan. All questions answered. Return precautions discussed and outpatient follow up recommendations given.  Reva Bores, MD 01/08/2023 4:52 PM

## 2023-01-08 NOTE — MAU Note (Signed)
.  Hannah Cook is a 42 y.o. at [redacted]w[redacted]d here in MAU reporting: Here for further follow up after having to leave AMA yesterday prior to her [place] being performed. She reports she had been experiencing lower abdominal cramping for two days but has not felt any pain since yesterday. Denies VB. Denies vaginal discharge.  LMP: unsure - sometime in June  Pain score: Denies pain.  FHT: n/a Lab orders placed from triage:  none - UA performed yesterday in MAU. Orders placed by MD.

## 2023-01-10 ENCOUNTER — Other Ambulatory Visit: Payer: Self-pay | Admitting: Lactation Services

## 2023-01-10 ENCOUNTER — Other Ambulatory Visit: Payer: Medicaid Other | Admitting: General Practice

## 2023-01-10 ENCOUNTER — Telehealth: Payer: Self-pay | Admitting: General Practice

## 2023-01-10 DIAGNOSIS — O3680X0 Pregnancy with inconclusive fetal viability, not applicable or unspecified: Secondary | ICD-10-CM

## 2023-01-10 NOTE — Progress Notes (Signed)
Opened in error

## 2023-01-10 NOTE — Telephone Encounter (Signed)
Patient no showed for stat bhcg appt today following up from MAU visit 7/23. Called patient to discuss missed appt but the person that answered wouldn't verify patient's birthday- hung up before appt information could be discussed.

## 2023-01-20 ENCOUNTER — Ambulatory Visit
Admission: EM | Admit: 2023-01-20 | Discharge: 2023-01-20 | Disposition: A | Discharge status: Home / Self Care | Payer: Medicaid Other

## 2023-01-20 DIAGNOSIS — J069 Acute upper respiratory infection, unspecified: Secondary | ICD-10-CM | POA: Diagnosis not present

## 2023-01-20 DIAGNOSIS — O4690 Antepartum hemorrhage, unspecified, unspecified trimester: Secondary | ICD-10-CM

## 2023-01-20 DIAGNOSIS — G5603 Carpal tunnel syndrome, bilateral upper limbs: Secondary | ICD-10-CM

## 2023-01-20 NOTE — Discharge Instructions (Signed)
Given your recent onset of bleeding during pregnancy, I recommend you go straight to the women's and children's emergency room for further evaluation.  Regarding your carpal tunnel syndrome, continued use of the brace is needed.  It is not safe to take anti-inflammatories while pregnant, so Tylenol is the only thing we can prescribe. You can talk to your OB about possible low dose gabapentin.  Regarding your new onset URI symptoms, please discuss this while in the [facility] and childrens ER to consider having a covid swab performed.  Please head to ER now.

## 2023-01-20 NOTE — ED Provider Notes (Signed)
[facility]    CSN: [id number] Arrival date & time: 01/20/23  1252      History   Chief Complaint Chief Complaint  Patient presents with   Hand Pain    HPI Hannah Cook is a 42 y.o. female.   43 year old female presents today primarily due to concerns of worsening wrist pain.  States she has a known history of carpal tunnel.  She was recommended that she have the surgery performed, but states she is scared to do this.  She is wearing the wrist brace as recommended, but states that the pain is worsening.  States she can barely move her left wrist back-and-forth.  She is not currently taking anything for the pain and discomfort. Of note, she was recently found to be pregnant.  She does not know exactly how far along she is.  States that last evening, she started bleeding profusely.  States it looks just like her period.  She reports spotting with her previous 2 pregnancies, but states this is much more profuse.  She denies any abdominal pain or cramping. Lastly, patient is concerned about some nasal congestion and dry cough she has had for the past 2 days.   Hand Pain    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   Thyroid disease    UTI (urinary tract infection)     Patient Active Problem List   Diagnosis Date Noted   Supervision of high risk pregnancy, antepartum 05/13/2019   AMA (advanced maternal age) multigravida 35+ 05/13/2019   Seizure disorder during pregnancy (HCC) 05/13/2019   Vaginal bleeding in pregnancy, third trimester 04/08/2017   Tobacco smoking complicating pregnancy 01/30/2017   History of intracranial aneurysm s/p coiling 11/05/2016   Obesity (BMI 30.0-34.9) 02/16/2014   Anxiety 03/10/2012    Past Surgical History:  Procedure Laterality Date   ANEURYSM COILING     BRAIN SURGERY     TONSILLECTOMY      OB  History     Gravida  10   Para  4   Term  4   Preterm      AB  5   Living  4      SAB  1   IAB  3   Ectopic  0   Multiple  0   Live Births  4            Home Medications    Prior to Admission medications   Medication Sig Start Date End Date Taking? Authorizing Provider  Blood Pressure Monitoring (BLOOD PRESSURE KIT) DEVI 1 Device by Does not apply route as needed. 05/13/19   Constant, Peggy, MD  lamoTRIgine (LAMICTAL) 100 MG tablet Take 200 mg by mouth 2 (two) times daily. 03/11/17   [provider]  Vitamin D, Ergocalciferol, (DRISDOL) 1.25 MG (50000 UNIT) CAPS capsule Take 1 capsule by mouth once a week. 10/22/20   [provider]  topiramate (TOPAMAX) 25 MG tablet Take 25 mg by mouth 2 (two) times daily.  09/06/11  [provider]    Family History Family History  Problem Relation Age of Onset   Thyroid disease Mother    Aortic aneurysm Mother        heart   Aneurysm Mother    HIV/AIDS Father  drug use related   Hypertension Sister    Diabetes Sister    Asthma Daughter    Hypertension Maternal Aunt    Hypertension Maternal Uncle    Hypertension Maternal Grandmother    Diabetes Maternal Grandmother     Social History Social History   Tobacco Use   Smoking status: Former    Current packs/day: 1.00    Types: Cigarettes   Smokeless tobacco: Never   Tobacco comments:    2 cigarettes  Vaping Use   Vaping status: Never Used  Substance Use Topics   Alcohol use: Not Currently   Drug use: No     Allergies   Patient has no known allergies.   Review of Systems Review of Systems As per HPI  Physical Exam Triage Vital Signs ED Triage Vitals  Encounter Vitals Group     BP 01/20/23 1328 116/79     Systolic BP Percentile --      Diastolic BP Percentile --      Pulse Rate 01/20/23 1328 90     Resp 01/20/23 1328 20     Temp 01/20/23 1328 99.1 F (37.3 C)     Temp Source 01/20/23 1328 Oral     SpO2 01/20/23  1328 96 %     Weight --      Height --      Head Circumference --      Peak Flow --      Pain Score 01/20/23 1332 10     Pain Loc --      Pain Education --      Exclude from Growth Chart --    No data found.  Updated Vital Signs BP 116/79 (BP Location: Right Arm)   Pulse 90   Temp 99.1 F (37.3 C) (Oral)   Resp 20   SpO2 96%   Visual Acuity Right Eye Distance:   Left Eye Distance:   Bilateral Distance:    Right Eye Near:   Left Eye Near:    Bilateral Near:     Physical Exam Vitals and nursing note reviewed.  Constitutional:      General: She is not in acute distress.    Appearance: Normal appearance. She is not ill-appearing, toxic-appearing or diaphoretic.  HENT:     Head: Normocephalic.  Cardiovascular:     Rate and Rhythm: Normal rate.  Pulmonary:     Effort: Pulmonary effort is normal. No respiratory distress.  Musculoskeletal:        General: Swelling and tenderness present.     Comments: Decreased ROM to L wrist with pain to palpation. No erythema or warmth  Skin:    General: Skin is warm and dry.     Findings: No erythema or rash.  Neurological:     General: No focal deficit present.     Mental Status: She is alert and oriented to person, place, and time.      UC Treatments / Results  Labs (all labs ordered are listed, but only abnormal results are displayed) Labs Reviewed - No data to display  EKG   Radiology No results found.  Procedures Procedures (including critical care time)  Medications Ordered in UC Medications - No data to display  Initial Impression / Assessment and Plan / UC Course  I have reviewed the triage vital signs and the nursing notes.  Pertinent labs & imaging results that were available during my care of the patient were reviewed by me and considered in my medical decision  making (see chart for details).     Vaginal bleeding in pregnancy -patient is uncertain the trimester she is in, states she had an ultrasound  performed and they were still uncertain.  She has a new onset of profuse vaginal bleeding within the past 12 hours.  Due to this, I recommended patient head to the women's and children's emergency room for further evaluation immediately. Carpal tunnel syndrome -recently worsening.  Question if secondary to pregnancy.  Discussed with patient that Tylenol is really the only thing safe for pregnancy, she could consider gabapentin if OB feels that this is safe.  Must continue to wear brace. Viral URI -patient is on day 2.  Would recommend possible a COVID swab while in the emergency room.  We did not perform this at urgent care today.   Final Clinical Impressions(s) / UC Diagnoses   Final diagnoses:  Vaginal bleeding in pregnancy, unspecified trimester  Bilateral carpal tunnel syndrome  Viral URI     Discharge Instructions      Given your recent onset of bleeding during pregnancy, I recommend you go straight to the women's and children's emergency room for further evaluation.  Regarding your carpal tunnel syndrome, continued use of the brace is needed.  It is not safe to take anti-inflammatories while pregnant, so Tylenol is the only thing we can prescribe. You can talk to your OB about possible low dose gabapentin.  Regarding your new onset URI symptoms, please discuss this while in the [facility] and childrens ER to consider having a covid swab performed.  Please head to ER now.      ED Prescriptions   None    PDMP not reviewed this encounter.   Maretta Bees, [place] 01/20/23 1402

## 2023-01-20 NOTE — ED Triage Notes (Addendum)
Pt c/o left hand pain states she knows it is carpal tunnel and needs surgery-requesting pain med-last tylenol yesterday-pt wearing velcro splint to left hand/forearm-pt also c/o runny nose and dry cough x 2 days-NAD-steady gait

## 2023-01-20 NOTE — ED Notes (Signed)
Patient is being discharged from the Urgent Care and sent to the Emergency Department via POV . Per M. Crain, [place], patient is in need of higher level of care due to vaginal bleeding/pregnant. Patient is aware and verbalizes understanding of plan of care.  Vitals:   01/20/23 1328  BP: 116/79  Pulse: 90  Resp: 20  Temp: 99.1 F (37.3 C)  SpO2: 96%

## 2023-01-22 ENCOUNTER — Inpatient Hospital Stay (HOSPITAL_COMMUNITY)
Admission: AD | Admit: 2023-01-22 | Discharge: 2023-01-22 | Discharge status: Against Medical Advice | Payer: Medicaid Other | Attending: Obstetrics and Gynecology | Admitting: Obstetrics and Gynecology

## 2023-01-22 NOTE — MAU Note (Signed)
Pt left before being triaged. Told secretary she was tired of waiting while other pt where being called ahead of her. Explained to her that pt are brought back in order of urgency and not when they get here. Pt stated she would come back if she felt like she was getting worse.

## 2023-01-23 ENCOUNTER — Inpatient Hospital Stay (HOSPITAL_COMMUNITY): Payer: Medicaid Other

## 2023-01-23 ENCOUNTER — Encounter (HOSPITAL_COMMUNITY): Payer: Self-pay | Admitting: Obstetrics and Gynecology

## 2023-01-23 ENCOUNTER — Inpatient Hospital Stay (HOSPITAL_COMMUNITY)
Admission: AD | Admit: 2023-01-23 | Discharge: 2023-01-23 | Disposition: A | Discharge status: Home / Self Care | Payer: Medicaid Other | Source: Home / Self Care | Attending: Obstetrics and Gynecology | Admitting: Obstetrics and Gynecology

## 2023-01-23 DIAGNOSIS — O039 Complete or unspecified spontaneous abortion without complication: Secondary | ICD-10-CM | POA: Diagnosis not present

## 2023-01-23 DIAGNOSIS — R200 Anesthesia of skin: Secondary | ICD-10-CM | POA: Insufficient documentation

## 2023-01-23 DIAGNOSIS — Z87891 Personal history of nicotine dependence: Secondary | ICD-10-CM | POA: Diagnosis not present

## 2023-01-23 DIAGNOSIS — Z3A01 Less than 8 weeks gestation of pregnancy: Secondary | ICD-10-CM | POA: Insufficient documentation

## 2023-01-23 DIAGNOSIS — M25531 Pain in right wrist: Secondary | ICD-10-CM | POA: Diagnosis not present

## 2023-01-23 DIAGNOSIS — O209 Hemorrhage in early pregnancy, unspecified: Secondary | ICD-10-CM | POA: Diagnosis present

## 2023-01-23 DIAGNOSIS — M25532 Pain in left wrist: Secondary | ICD-10-CM | POA: Diagnosis not present

## 2023-01-23 DIAGNOSIS — O3680X0 Pregnancy with inconclusive fetal viability, not applicable or unspecified: Secondary | ICD-10-CM

## 2023-01-23 HISTORY — DX: Carpal tunnel syndrome, bilateral upper limbs: G56.03

## 2023-01-23 LAB — COMPREHENSIVE METABOLIC PANEL
ALT: 19 U/L (ref 0–44)
AST: 24 U/L (ref 15–41)
Albumin: 3.5 g/dL (ref 3.5–5.0)
Alkaline Phosphatase: 70 U/L (ref 38–126)
Anion gap: 7 (ref 5–15)
BUN: 12 mg/dL (ref 6–20)
CO2: 25 mmol/L (ref 22–32)
Calcium: 8.5 mg/dL — ABNORMAL LOW (ref 8.9–10.3)
Chloride: 104 mmol/L (ref 98–111)
Creatinine, Ser: 0.8 mg/dL (ref 0.44–1.00)
GFR, Estimated: >60 mL/min (ref >60)
Glucose, Bld: 139 mg/dL — ABNORMAL HIGH (ref 70–99)
Potassium: 3.7 mmol/L (ref 3.5–5.1)
Sodium: 136 mmol/L (ref 135–145)
Total Bilirubin: 0.4 mg/dL (ref 0.3–1.2)
Total Protein: 6.7 g/dL (ref 6.5–8.1)

## 2023-01-23 LAB — HCG, QUANTITATIVE, PREGNANCY: hCG, Beta Chain, Quant, S: 103 m[IU]/mL — ABNORMAL HIGH (ref <5)

## 2023-01-23 LAB — CBC
HCT: 39.3 % (ref 36.0–46.0)
Hemoglobin: 13.7 g/dL (ref 12.0–15.0)
MCH: 32 pg (ref 26.0–34.0)
MCHC: 34.9 g/dL (ref 30.0–36.0)
MCV: 91.8 fL (ref 80.0–100.0)
Platelets: 262 10*3/uL (ref 150–400)
RBC: 4.28 MIL/uL (ref 3.87–5.11)
RDW: 12.8 % (ref 11.5–15.5)
WBC: 5.2 10*3/uL (ref 4.0–10.5)
nRBC: 0 % (ref 0.0–0.2)

## 2023-01-23 NOTE — MAU Note (Signed)
Lab called regarding change from Beta HCG quant to HCG (quant), first is "in processing", the correct order still saying needs to be collected, ? Can it not be ran from original draw? They will proceed.

## 2023-01-23 NOTE — MAU Provider Note (Signed)
History     CSN: [id number]  Arrival date and time: 01/23/23 1352  Chief Complaint  Patient presents with   Vaginal Bleeding   HPI Hannah Cook is a 42 yo [id number] at [redacted]w[redacted]d by 1st trimester [place] who present with continued vaginal bleeding with quarter sized clots. Denies soaking a pad within an hour. Endorses bleeding as heavy as a period. Denies cramping or abdominal pain. Denies lightheadedness, dyspnea, or headache. Denies fever, chills or dysuria.   Complains of worsening of her chronic bilateral wrist pain and finger numbness. Reports history of carpal tunnel syndrome. Denies weakened grip strength. Has a splint at home, that she wears sometimes. Pain somewhat improved with tylenol. Endorses taking 2x 500mg  tabs every 4 hours. Discussed safe tylenol dosing of </= 1000mg  every 6 hours, and not to exceed 4gm per 24 hour period. Encouraged splinting and activity modification.   Of note patient was seen 7/23 for cramping and 8/4 for vaginal bleeding with recommendation for follow up beta HCG and [place] today.   OB History     Gravida  10   Para  4   Term  4   Preterm  0   AB  5   Living  4      SAB  1   IAB  3   Ectopic  0   Multiple  0   Live Births  4           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   Thyroid disease    UTI (urinary tract infection)     Past Surgical History:  Procedure Laterality Date   ANEURYSM COILING     BRAIN SURGERY     TONSILLECTOMY      Family History  Problem Relation Age of Onset   Thyroid disease Mother    Aortic aneurysm Mother        heart   Aneurysm Mother    HIV/AIDS Father        drug use related   Hypertension Sister    Diabetes Sister    Asthma Daughter    Hypertension Maternal Aunt    Hypertension Maternal Uncle    Hypertension  Maternal Grandmother    Diabetes Maternal Grandmother     Social History   Tobacco Use   Smoking status: Former    Current packs/day: 1.00    Types: Cigarettes   Smokeless tobacco: Never   Tobacco comments:    2 cigarettes  Vaping Use   Vaping status: Never Used  Substance Use Topics   Alcohol use: Not Currently   Drug use: No    Allergies: No Known Allergies  Medications Prior to Admission  Medication Sig Dispense Refill Last Dose   acetaminophen (TYLENOL) 325 MG tablet Take 650 mg by mouth every 6 (six) hours as needed.   01/22/2023 at 1000   lamoTRIgine (LAMICTAL) 100 MG tablet Take 200 mg by mouth 2 (two) times daily.  1 01/22/2023   Vitamin D, Ergocalciferol, (DRISDOL) 1.25 MG (50000 UNIT) CAPS capsule Take 1 capsule by mouth once a week.   01/22/2023   Blood Pressure Monitoring (BLOOD PRESSURE KIT) DEVI 1 Device by Does not apply route as needed. 1 each 0     Review of  Systems Physical Exam   Blood pressure 110/65, pulse 90, temperature 98.6 F (37 C), temperature source Oral, resp. rate 18, height 5\' 6"  (1.676 m), weight 93.8 kg, SpO2 98%.  Physical Exam CONSTITUTIONAL: Well-developed, well-nourished female in no acute distress.  HENT:  Normocephalic, atraumatic, External right and left ear normal. Oropharynx is clear and moist EYES: Conjunctivae and EOM are normal.  No scleral icterus.  NECK: Normal range of motion, supple, no masses.  Normal thyroid.  SKIN: Skin is warm and dry. No rash noted. Not diaphoretic. No erythema. No pallor. NEUROLGIC: Alert and oriented to person, place, and time.  CARDIOVASCULAR: Normal heart rate noted RESPIRATORY: Effort and breath sounds normal, no problems with respiration noted. ABDOMEN: Soft, normal bowel sounds, no distention noted.  No tenderness, rebound or guarding.  MUSCULOSKELETAL: Normal range of motion. No tenderness.  No cyanosis, clubbing, or edema. Carpal tunnel tinel's positive. 5/5 grip strength. No edema, erythema for  bilateral wrist/hand.  MAU Course  Procedures Narrative & Impression CLINICAL DATA:  Vaginal bleeding   EXAM: TRANSVAGINAL OB ULTRASOUND   TECHNIQUE: Transvaginal ultrasound was performed for complete evaluation of the gestation as well as the maternal uterus, adnexal regions, and pelvic cul-de-sac.   COMPARISON:  Obstetric ultrasound 01/08/2023.   FINDINGS: Intrauterine gestational sac: None   Yolk sac:  Not Visualized.   Embryo:  Not Visualized.   Cardiac Activity: N/a   Maternal uterus/adnexae: The uterus is normal in appearance. A probable nabothian cyst is noted. The endometrium measures 6 mm in thickness there is no focal abnormality or vascularity. The ovaries are not identified. There is no adnexal mass. There is no free fluid in the pelvis.   IMPRESSION: No intrauterine pregnancy or adnexal mass identified. Given findings on prior obstetric ultrasound, findings favored to reflect completed miscarriage. Recommend obstetric follow-up and correlation with beta HCG.   MDM Evaluation for possible first trimester pregnancy failure  7/23 [place] consistent with intrauterine gestational sac only Beta HCG quant and pelvic ultrasound -  CBC evaluated given continued bleeding to evaluated for blood loss anemia - Hb 13.7 not anemic.  Assessment and Plan  Complete Miscarriage at <8 weeks Blood type O pos - discharge home with continued f/u HCG in 48 hours - return precautions reviewed  Wyn Forster, MD OB Fellow [facility] 01/23/2023, 3:28 PM

## 2023-01-23 NOTE — MAU Note (Signed)
Hannah Cook is a 42 y.o. at [redacted]w[redacted]d here in MAU reporting: bleeding heavy for 2 days, changing soaked pads, twice every hour. Passing quarter sized clots.  Denies abd pain.  Onset of complaint: 2 days Pain score: none Vitals:   01/23/23 1404  BP: 110/65  Pulse: 90  Resp: 18  Temp: 98.6 F (37 C)  SpO2: 98%      Lab orders placed from triage:    Asking about carpel tunnel, pain in left wrist getting worse.  Ongoing problem since she was preg with her 42y/o.

## 2023-01-23 NOTE — MAU Note (Signed)
Called pt for [place], registration stated she had ran out to her car.  [place] notified.

## 2023-01-25 ENCOUNTER — Other Ambulatory Visit: Payer: Medicaid Other

## 2023-03-21 ENCOUNTER — Ambulatory Visit
Admission: EM | Admit: 2023-03-21 | Discharge: 2023-03-21 | Disposition: A | Discharge status: Home / Self Care | Payer: Medicaid Other | Attending: Internal Medicine | Admitting: Internal Medicine

## 2023-03-21 DIAGNOSIS — G5603 Carpal tunnel syndrome, bilateral upper limbs: Secondary | ICD-10-CM | POA: Insufficient documentation

## 2023-03-21 DIAGNOSIS — R350 Frequency of micturition: Secondary | ICD-10-CM | POA: Insufficient documentation

## 2023-03-21 DIAGNOSIS — L249 Irritant contact dermatitis, unspecified cause: Secondary | ICD-10-CM | POA: Diagnosis present

## 2023-03-21 DIAGNOSIS — N898 Other specified noninflammatory disorders of vagina: Secondary | ICD-10-CM | POA: Diagnosis present

## 2023-03-21 DIAGNOSIS — M778 Other enthesopathies, not elsewhere classified: Secondary | ICD-10-CM | POA: Diagnosis present

## 2023-03-21 LAB — POCT URINALYSIS DIP (MANUAL ENTRY)
Bilirubin, UA: NEGATIVE
Blood, UA: NEGATIVE
Glucose, UA: NEGATIVE mg/dL
Ketones, POC UA: NEGATIVE mg/dL
Leukocytes, UA: NEGATIVE
Nitrite, UA: NEGATIVE
Protein Ur, POC: NEGATIVE mg/dL
Spec Grav, UA: >=1.03 — AB (ref 1.010–1.025)
Urobilinogen, UA: 0.2 U/dL
pH, UA: 5.5 (ref 5.0–8.0)

## 2023-03-21 LAB — POCT URINE PREGNANCY: Preg Test, Ur: NEGATIVE

## 2023-03-21 MED ORDER — NAPROXEN 500 MG PO TABS: 500.0000 mg | ORAL_TABLET | Freq: Two times a day (BID) | ORAL | 0 refills | Status: DC

## 2023-03-21 MED ORDER — FLUCONAZOLE 150 MG PO TABS: 150.0000 mg | ORAL_TABLET | ORAL | 0 refills | Status: DC

## 2023-03-21 MED ORDER — METRONIDAZOLE 500 MG PO TABS: 500.0000 mg | ORAL_TABLET | Freq: Two times a day (BID) | ORAL | 0 refills | Status: DC

## 2023-03-21 MED ORDER — TRIAMCINOLONE ACETONIDE 0.1 % EX CREA: 1.0000 | TOPICAL_CREAM | Freq: Two times a day (BID) | CUTANEOUS | 0 refills | Status: DC

## 2023-03-21 MED ORDER — DICLOFENAC SODIUM 3 % EX GEL: 1.0000 | Freq: Two times a day (BID) | CUTANEOUS | 0 refills | Status: DC | PRN

## 2023-03-21 NOTE — ED Triage Notes (Signed)
Pt reports insect bite in left ankle x 4 days. Reports itchiness in left ankle.   Pt reports increase urinary frequency.Pt states urine looks creamy with bad odor.   Pt reports on and off bilateral wrist pain x years. Reports she was schedules for carpal; tunnel surgery, but she got scare as people said is worse after surgery. Pt reports wrist brace gives no relief, she wear it daily x 1 hr. Marland Kitchen

## 2023-03-21 NOTE — Discharge Instructions (Addendum)
Go ahead and start metronidazole for suspected recurring BV infection.  I need you to use 5 total doses of fluconazole to help send about a suspected yeast infection and is important as well as you will be taking the antibiotic metronidazole.  Avoid drinking alcohol while on these medications.  Will let you know about your vaginal swab results and urine culture results as they come back over the next few days.  For the left lower ankle, I would like for you to use a steroid cream for 1 week.  Apply thin layers twice daily.  This can help with the irritation from a possible bug bite or irritation from grass or irritant plant life.  Follow-up with your orthopedist for your carpal tunnel syndrome of both your wrist, possible ganglion cyst of the left wrist.  In the meantime you can try naproxen for pain and inflammation.  If this medication is difficult to take due to side effects then you can go ahead and switch to diclofenac gel.  However, do not use both of these together at the same time.

## 2023-03-21 NOTE — ED Provider Notes (Signed)
[facility]  Note:  This document was prepared using Conservation officer, historic buildings and may include unintentional dictation errors.  MRN: [id number] DOB: 1981-05-03  Subjective:   LORRA FREEMAN is a 42 y.o. female presenting for multiple concerns.  Reports itching and irritation of left ankle for the past 4 days following a possible insect bite. No fever, tenderness, drainage of pus or bleeding. Reports 1.5 week history of acute onset vaginal discharge, urinary frequency, urinary urgency, malodorous urine.  Patient ended up using a douche and symptoms started thereafter.  She does have unprotected sex with 1 female partner.  Would like STI check.  Has had frequent bouts of BV, yeast infections and UTIs.  Does not hydrate well with water.  She drinks soda, sweet tea and also drinks alcohol heavily over the weekend. Patient also reports longstanding history of bilateral wrist pain.  Has previously been diagnosed and managed for carpal tunnel syndrome.  Earlier this year, was recommended to have surgery but patient did not follow through with this out of concern for complications and sequelae.  She has not suffered any particular injury to the wrist.  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  No current facility-administered medications for this encounter.  Current Outpatient Medications:    phentermine 15 MG capsule, Take 15 mg by mouth every morning., Disp: , Rfl:    acetaminophen (TYLENOL) 325 MG tablet, Take 650 mg by mouth every 6 (six) hours as needed., Disp: , Rfl:    Blood Pressure Monitoring (BLOOD PRESSURE KIT) DEVI, 1 Device by Does not apply route as needed., Disp: 1 each, Rfl: 0   lamoTRIgine (LAMICTAL) 100 MG tablet, Take 200 mg by mouth 2 (two) times daily., Disp: , Rfl: 1   Vitamin D, Ergocalciferol, (DRISDOL) 1.25 MG (50000 UNIT)  CAPS capsule, Take 1 capsule by mouth once a week., Disp: , Rfl:    Allergies  Allergen Reactions   Hydroxacen [Hydroxyzine] Itching and Nausea And Vomiting    Denies problems   Hydrocodone-Acetaminophen Nausea And Vomiting, Itching and Other (See Comments)    Headache. , headaches    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   Thyroid disease    UTI (urinary tract infection)      Past Surgical History:  Procedure Laterality Date   ANEURYSM COILING     BRAIN SURGERY     TONSILLECTOMY      Family History  Problem Relation Age of Onset   Thyroid disease Mother    Aortic aneurysm Mother        heart   Aneurysm Mother    HIV/AIDS Father        drug use related   Hypertension Sister    Diabetes Sister    Asthma Daughter    Hypertension Maternal Aunt    Hypertension Maternal Uncle    Hypertension Maternal Grandmother    Diabetes Maternal Grandmother     Social History   Tobacco Use   Smoking status: Former    Current packs/day: 1.00    Types: Cigarettes   Smokeless  tobacco: Never   Tobacco comments:    2 cigarettes  Vaping Use   Vaping status: Never Used  Substance Use Topics   Alcohol use: Not Currently   Drug use: No    ROS   Objective:   Vitals: BP 104/73 (BP Location: Right Arm)   Pulse 84   Temp 99.3 F (37.4 C) (Oral)   Resp 16   SpO2 95%   Breastfeeding No   Physical Exam Constitutional:      General: She is not in acute distress.    Appearance: Normal appearance. She is well-developed. She is not ill-appearing, toxic-appearing or diaphoretic.  HENT:     Head: Normocephalic and atraumatic.     Nose: Nose normal.     Mouth/Throat:     Mouth: Mucous membranes are moist.  Eyes:     General: No scleral icterus.       Right eye: No discharge.        Left eye: No  discharge.     Extraocular Movements: Extraocular movements intact.     Conjunctiva/sclera: Conjunctivae normal.  Cardiovascular:     Rate and Rhythm: Normal rate.  Pulmonary:     Effort: Pulmonary effort is normal.  Abdominal:     General: Bowel sounds are normal. There is no distension.     Palpations: Abdomen is soft. There is no mass.     Tenderness: There is no abdominal tenderness. There is no right CVA tenderness, left CVA tenderness, guarding or rebound.  Musculoskeletal:       Hands:  Skin:    General: Skin is warm and dry.     Findings: Rash present.       Neurological:     General: No focal deficit present.     Mental Status: She is alert and oriented to person, place, and time.  Psychiatric:        Mood and Affect: Mood normal.        Behavior: Behavior normal.        Thought Content: Thought content normal.        Judgment: Judgment normal.     Results for orders placed or performed during the hospital encounter of 03/21/23 (from the past 24 hour(s))  POCT urinalysis dipstick     Status: Abnormal   Collection Time: 03/21/23  5:39 PM  Result Value Ref Range   Color, UA yellow yellow   Clarity, UA clear clear   Glucose, UA negative negative mg/dL   Bilirubin, UA negative negative   Ketones, POC UA negative negative mg/dL   Spec Grav, UA >=6.962 (A) 1.010 - 1.025   Blood, UA negative negative   pH, UA 5.5 5.0 - 8.0   Protein Ur, POC negative negative mg/dL   Urobilinogen, UA 0.2 0.2 or 1.0 E.U./dL   Nitrite, UA Negative Negative   Leukocytes, UA Negative Negative  POCT urine pregnancy     Status: None   Collection Time: 03/21/23  5:39 PM  Result Value Ref Range   Preg Test, Ur Negative Negative    Assessment and Plan :   PDMP not reviewed this encounter.  1. Irritant dermatitis   2. Vaginal discharge   3. Urinary frequency   4. Tendonitis of both wrists   5. Bilateral carpal tunnel syndrome    Recommend triamcinolone cream for the irritant  dermatitis.  We will treat patient empirically for bacterial vaginosis with Flagyl and for yeast vaginitis with fluconazole.  Labs pending.  Advised naproxen for  tendinitis of both wrists, bilateral syndrome.  Wear the wrist splints at night.  If patient cannot tolerate naproxen, she can switch to diclofenac gel.  Otherwise emphasized urgent follow-up with her orthopedic surgeon.  Counseled patient on potential for adverse effects with medications prescribed/recommended today, ER and return-to-clinic precautions discussed, patient verbalized understanding.    Wallis Bamberg, [place] 03/21/23 1756

## 2023-03-22 LAB — URINE CULTURE: Culture: NO GROWTH

## 2023-03-22 LAB — RPR: RPR Ser Ql: NONREACTIVE

## 2023-03-22 LAB — HIV ANTIBODY (ROUTINE TESTING W REFLEX): HIV Screen 4th Generation wRfx: NONREACTIVE

## 2023-03-25 LAB — CERVICOVAGINAL ANCILLARY ONLY
Bacterial Vaginitis (gardnerella): POSITIVE — AB
Candida Glabrata: NEGATIVE
Candida Vaginitis: NEGATIVE
Chlamydia: NEGATIVE
Comment: NEGATIVE
Comment: NEGATIVE
Comment: NEGATIVE
Comment: NEGATIVE
Comment: NEGATIVE
Comment: NORMAL
Neisseria Gonorrhea: NEGATIVE
Trichomonas: NEGATIVE

## 2023-06-13 ENCOUNTER — Ambulatory Visit
Admission: EM | Admit: 2023-06-13 | Discharge: 2023-06-13 | Disposition: A | Discharge status: Home / Self Care | Payer: Medicaid Other | Attending: Family Medicine | Admitting: Family Medicine

## 2023-06-13 DIAGNOSIS — N760 Acute vaginitis: Secondary | ICD-10-CM | POA: Diagnosis present

## 2023-06-13 DIAGNOSIS — F17200 Nicotine dependence, unspecified, uncomplicated: Secondary | ICD-10-CM | POA: Insufficient documentation

## 2023-06-13 DIAGNOSIS — I739 Peripheral vascular disease, unspecified: Secondary | ICD-10-CM | POA: Diagnosis present

## 2023-06-13 DIAGNOSIS — B9689 Other specified bacterial agents as the cause of diseases classified elsewhere: Secondary | ICD-10-CM | POA: Diagnosis present

## 2023-06-13 LAB — POCT URINALYSIS DIP (MANUAL ENTRY)
Bilirubin, UA: NEGATIVE
Blood, UA: NEGATIVE
Glucose, UA: NEGATIVE mg/dL
Ketones, POC UA: NEGATIVE mg/dL
Leukocytes, UA: NEGATIVE
Nitrite, UA: NEGATIVE
Protein Ur, POC: NEGATIVE mg/dL
Spec Grav, UA: 1.02
Urobilinogen, UA: 1 U/dL
pH, UA: 7

## 2023-06-13 LAB — POCT URINE PREGNANCY: Preg Test, Ur: NEGATIVE

## 2023-06-13 MED ORDER — FLUCONAZOLE 150 MG PO TABS: 150.0000 mg | ORAL_TABLET | ORAL | 0 refills | Status: DC

## 2023-06-13 MED ORDER — METRONIDAZOLE 500 MG PO TABS: 500.0000 mg | ORAL_TABLET | Freq: Two times a day (BID) | ORAL | 0 refills | Status: DC

## 2023-06-13 NOTE — ED Triage Notes (Signed)
Pt c/o bilat foot pain/swelling "for a long time"-worse x 1 week-states she take fluid pill for c/o-also c/o vaginal irrigation and odor after using a new soap 1 week ago-NAD-steady gait

## 2023-06-13 NOTE — ED Provider Notes (Signed)
[facility]  Note:  This document was prepared using Conservation officer, historic buildings and may include unintentional dictation errors.  MRN: [id number] DOB: 1981/03/06  Subjective:   Hannah Cook is a 42 y.o. female presenting for 2 concerns.  Reports recurrent malodorous vaginal discharge and itching after using a new soap. History of BV, yeast. Denies fever, n/v, abdominal pain, pelvic pain, rashes, dysuria, urinary frequency, hematuria.  Would like STI testing.  Reports 1 year history of persistent intermittent bilateral foot pain, swelling. Has been prescribed "fluid pills" but is not working anymore. Takes furosemide. Works on her feet a lot as a Lawyer, does line work as well.  Has 25-pack-year history of smoking.  Still smokes.  No current facility-administered medications for this encounter.  Current Outpatient Medications:    furosemide (LASIX) 20 MG tablet, Take 20 mg by mouth daily as needed., Disp: , Rfl:    acetaminophen (TYLENOL) 325 MG tablet, Take 650 mg by mouth every 6 (six) hours as needed., Disp: , Rfl:    Blood Pressure Monitoring (BLOOD PRESSURE KIT) DEVI, 1 Device by Does not apply route as needed., Disp: 1 each, Rfl: 0   Diclofenac Sodium 3 % GEL, Apply 1 Application topically 2 (two) times daily as needed., Disp: 100 g, Rfl: 0   fluconazole (DIFLUCAN) 150 MG tablet, Take 1 tablet (150 mg total) by mouth every 3 (three) days., Disp: 5 tablet, Rfl: 0   lamoTRIgine (LAMICTAL) 100 MG tablet, Take 200 mg by mouth 2 (two) times daily., Disp: , Rfl: 1   metroNIDAZOLE (FLAGYL) 500 MG tablet, Take 1 tablet (500 mg total) by mouth 2 (two) times daily with a meal. DO NOT CONSUME ALCOHOL WHILE TAKING THIS MEDICATION., Disp: 14 tablet, Rfl: 0   naproxen (NAPROSYN) 500 MG tablet, Take 1 tablet (500 mg total) by mouth 2 (two) times daily with a meal., Disp: 30 tablet, Rfl: 0   phentermine 15 MG capsule, Take 15 mg by mouth every morning., Disp: , Rfl:     triamcinolone cream (KENALOG) 0.1 %, Apply 1 Application topically 2 (two) times daily., Disp: 30 g, Rfl: 0   Vitamin D, Ergocalciferol, (DRISDOL) 1.25 MG (50000 UNIT) CAPS capsule, Take 1 capsule by mouth once a week., Disp: , Rfl:    No Known Allergies  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   Thyroid disease    UTI (urinary tract infection)      Past Surgical History:  Procedure Laterality Date   ANEURYSM COILING     BRAIN SURGERY     TONSILLECTOMY      Family History  Problem Relation Age of Onset   Thyroid disease Mother    Aortic aneurysm Mother        heart   Aneurysm Mother    HIV/AIDS Father        drug use related   Hypertension Sister    Diabetes Sister    Asthma Daughter    Hypertension Maternal Aunt    Hypertension Maternal Uncle    Hypertension Maternal Grandmother    Diabetes Maternal Grandmother     Social History   Tobacco Use   Smoking status: Every Day    Current packs/day: 1.00    Types: Cigarettes  Smokeless tobacco: Never   Tobacco comments:    2 cigarettes  Vaping Use   Vaping status: Never Used  Substance Use Topics   Alcohol use: Yes    Comment: occ   Drug use: No    ROS   Objective:   Vitals: BP 120/82 (BP Location: Right Arm)   Pulse 79   Temp 98.9 F (37.2 C) (Oral)   Resp 20   LMP 06/07/2023 Comment: concerned LMP only 3 days  SpO2 97%   Physical Exam Constitutional:      General: She is not in acute distress.    Appearance: Normal appearance. She is well-developed. She is not ill-appearing, toxic-appearing or diaphoretic.  HENT:     Head: Normocephalic and atraumatic.     Nose: Nose normal.     Mouth/Throat:     Mouth: Mucous membranes are moist.  Eyes:     General: No scleral icterus.       Right eye: No discharge.        Left eye:  No discharge.     Extraocular Movements: Extraocular movements intact.  Cardiovascular:     Rate and Rhythm: Normal rate.  Pulmonary:     Effort: Pulmonary effort is normal.  Musculoskeletal:        General: Tenderness (distal lower legs; negative Homans' sign) present. No swelling, deformity or signs of injury.     Right lower leg: No edema.     Left lower leg: No edema.  Skin:    General: Skin is warm and dry.  Neurological:     General: No focal deficit present.     Mental Status: She is alert and oriented to person, place, and time.  Psychiatric:        Mood and Affect: Mood normal.        Behavior: Behavior normal.        Thought Content: Thought content normal.        Judgment: Judgment normal.    Results for orders placed or performed during the hospital encounter of 06/13/23 (from the past 24 hours)  POCT urine pregnancy     Status: Normal   Collection Time: 06/13/23  6:06 PM  Result Value Ref Range   Preg Test, Ur Negative   POCT urinalysis dipstick     Status: Normal   Collection Time: 06/13/23  6:06 PM  Result Value Ref Range   Color, UA yellow    Clarity, UA clear    Glucose, UA negative mg/dL   Bilirubin, UA negative    Ketones, POC UA negative mg/dL   Spec Grav, UA 8.295    Blood, UA negative    pH, UA 7.0    Protein Ur, POC negative mg/dL   Urobilinogen, UA 1.0 E.U./dL   Nitrite, UA Negative    Leukocytes, UA Negative     Assessment and Plan :   PDMP not reviewed this encounter.  1. Bacterial vaginosis   2. Acute vaginitis    Emphasized need for evaluation through the vascular clinic.  High suspicion for claudication, peripheral vascular, peripheral arterial disease.  Encouraged patient to quit smoking.  Otherwise, we will treat patient empirically for bacterial vaginosis with Flagyl and for yeast vaginitis with fluconazole.  Labs pending. Counseled patient on potential for adverse effects with medications prescribed/recommended today, ER and  return-to-clinic precautions discussed, patient verbalized understanding.    Wallis Bamberg, [place] 06/13/23 1820

## 2023-06-14 LAB — CERVICOVAGINAL ANCILLARY ONLY
Bacterial Vaginitis (gardnerella): POSITIVE — AB
Candida Glabrata: NEGATIVE
Candida Vaginitis: POSITIVE — AB
Chlamydia: NEGATIVE
Comment: NEGATIVE
Comment: NEGATIVE
Comment: NEGATIVE
Comment: NEGATIVE
Comment: NEGATIVE
Comment: NORMAL
Neisseria Gonorrhea: NEGATIVE
Trichomonas: NEGATIVE

## 2023-10-04 ENCOUNTER — Ambulatory Visit
Admission: EM | Admit: 2023-10-04 | Discharge: 2023-10-04 | Disposition: A | Discharge status: Home / Self Care | Attending: Nurse Practitioner | Admitting: Nurse Practitioner

## 2023-10-04 DIAGNOSIS — N3000 Acute cystitis without hematuria: Secondary | ICD-10-CM | POA: Insufficient documentation

## 2023-10-04 DIAGNOSIS — R300 Dysuria: Secondary | ICD-10-CM | POA: Diagnosis present

## 2023-10-04 LAB — POCT URINALYSIS DIP (MANUAL ENTRY)
Bilirubin, UA: NEGATIVE
Blood, UA: NEGATIVE
Glucose, UA: NEGATIVE mg/dL
Ketones, POC UA: NEGATIVE mg/dL
Nitrite, UA: NEGATIVE
Protein Ur, POC: NEGATIVE mg/dL
Spec Grav, UA: 1.025 (ref 1.010–1.025)
Urobilinogen, UA: 0.2 U/dL
pH, UA: 6 (ref 5.0–8.0)

## 2023-10-04 LAB — POCT URINE PREGNANCY: Preg Test, Ur: NEGATIVE

## 2023-10-04 MED ORDER — PHENAZOPYRIDINE HCL 200 MG PO TABS: 200.0000 mg | ORAL_TABLET | Freq: Three times a day (TID) | ORAL | 0 refills | Status: AC

## 2023-10-04 MED ORDER — NITROFURANTOIN MONOHYD MACRO 100 MG PO CAPS: 100.0000 mg | ORAL_CAPSULE | Freq: Two times a day (BID) | ORAL | 0 refills | Status: AC

## 2023-10-04 NOTE — ED Triage Notes (Addendum)
 Pt present with c/o dark urine and foul odor X 1 wk. Reports she has tingling feeling when voiding. Pt has been experiencing frequency.

## 2023-10-04 NOTE — ED Provider Notes (Signed)
 [facility]    CSN: [id number] Arrival date & time: 10/04/23  1236      History   Chief Complaint No chief complaint on file.   HPI Hannah Cook is a 43 y.o. female.   Subjective:  Hannah Cook is a 43 year old female presents for evaluation of a possible urinary tract infection. She reports noticing a thin, yellow vaginal discharge approximately one week ago, along with urinary odor at that time. She douched, after which the discharge resolved, but the odor persists and is only noticeable during urination. Over the past couple of days, she has developed urinary frequency and urgency with a decreased volume of urine each time. She denies incomplete bladder emptying. She also denies fever, nausea, vomiting, abdominal pain, dysuria, vaginal itching, or irritation. Her last menstrual period was two weeks ago. She is not on birth control and is sexually active with one female partner since 2018. She does not use condoms and does not consistently urinate after intercourse.  The following portions of the patient's history were reviewed and updated as appropriate: allergies, current medications, past family history, past medical history, past social history, past surgical history, and problem list.       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   Thyroid disease    UTI (urinary tract infection)     Patient Active Problem List   Diagnosis Date Noted   Supervision of high risk pregnancy, antepartum 05/13/2019   AMA (advanced maternal age) multigravida 35+ 05/13/2019   Seizure disorder during pregnancy (HCC) 05/13/2019   Vaginal bleeding in pregnancy, third trimester 04/08/2017   Tobacco smoking complicating pregnancy 01/30/2017   History of intracranial aneurysm s/p coiling 11/05/2016   Obesity  (BMI 30.0-34.9) 02/16/2014   Anxiety 03/10/2012    Past Surgical History:  Procedure Laterality Date   ANEURYSM COILING     BRAIN SURGERY     TONSILLECTOMY      OB History     Gravida  10   Para  4   Term  4   Preterm  0   AB  5   Living  4      SAB  1   IAB  3   Ectopic  0   Multiple  0   Live Births  4            Home Medications    Prior to Admission medications   Medication Sig Start Date End Date Taking? Authorizing Provider  nitrofurantoin , macrocrystal-monohydrate, (MACROBID ) 100 MG capsule Take 1 capsule (100 mg total) by mouth 2 (two) times daily. 10/04/23  Yes Winfred Iiams, FNP  phenazopyridine  (PYRIDIUM ) 200 MG tablet Take 1 tablet (200 mg total) by mouth 3 (three) times daily. 10/04/23  Yes Maryruth Sol, FNP  acetaminophen  (TYLENOL ) 325 MG tablet Take 650 mg by mouth every 6 (six) hours as needed.    [provider]  furosemide (LASIX) 20 MG tablet Take 20 mg by mouth daily as needed. 06/11/23   [provider]  lamoTRIgine  (LAMICTAL ) 100 MG tablet Take 200 mg by mouth 2 (two) times daily. 03/11/17   [provider]  phentermine 15 MG capsule Take 15 mg by mouth every morning.    [provider]  Vitamin  D, Ergocalciferol , (DRISDOL) 1.25 MG (50000 UNIT) CAPS capsule Take 1 capsule by mouth once a week. 10/22/20   [provider]  topiramate (TOPAMAX) 25 MG tablet Take 25 mg by mouth 2 (two) times daily.  09/06/11  [provider]    Family History Family History  Problem Relation Age of Onset   Thyroid disease Mother    Aortic aneurysm Mother        heart   Aneurysm Mother    HIV/AIDS Father        drug use related   Hypertension Sister    Diabetes Sister    Asthma Daughter    Hypertension Maternal Aunt    Hypertension Maternal Uncle    Hypertension Maternal Grandmother    Diabetes Maternal Grandmother     Social History Social History   Tobacco Use   Smoking status:  Every Day    Current packs/day: 1.00    Types: Cigarettes   Smokeless tobacco: Never   Tobacco comments:    2 cigarettes  Vaping Use   Vaping status: Never Used  Substance Use Topics   Alcohol use: Yes    Comment: occ   Drug use: No     Allergies   Patient has no known allergies.   Review of Systems Review of Systems  Constitutional:  Negative for fever.  Gastrointestinal:  Negative for abdominal pain, nausea and vomiting.  Genitourinary:  Positive for frequency, urgency and vaginal discharge. Negative for decreased urine volume, difficulty urinating, dysuria, flank pain, genital sores, hematuria, menstrual problem, pelvic pain, vaginal bleeding and vaginal pain.  Musculoskeletal:  Negative for back pain.  All other systems reviewed and are negative.    Physical Exam Triage Vital Signs ED Triage Vitals  Encounter Vitals Group     BP 10/04/23 1354 (!) 153/85     Systolic BP Percentile --      Diastolic BP Percentile --      Pulse Rate 10/04/23 1404 78     Resp 10/04/23 1354 15     Temp 10/04/23 1354 98.3 F (36.8 C)     Temp Source 10/04/23 1354 Oral     SpO2 10/04/23 1404 98 %     Weight --      Height --      Head Circumference --      Peak Flow --      Pain Score 10/04/23 1359 0     Pain Loc --      Pain Education --      Exclude from Growth Chart --    No data found.  Updated Vital Signs BP (!) 153/85 (BP Location: Left Arm)   Pulse 78   Temp 98.3 F (36.8 C) (Oral)   Resp 15   LMP 09/20/2023 (Exact Date) Comment: pt states she is preg-unknown LMP as of 01/20/2023  SpO2 98%   Visual Acuity Right Eye Distance:   Left Eye Distance:   Bilateral Distance:    Right Eye Near:   Left Eye Near:    Bilateral Near:     Physical Exam Vitals reviewed.  Constitutional:      General: She is not in acute distress.    Appearance: Normal appearance. She is well-developed. She is not ill-appearing, toxic-appearing or diaphoretic.  HENT:     Head:  Normocephalic.     Nose: Nose normal.     Mouth/Throat:     Mouth: Mucous membranes are moist.  Eyes:     Conjunctiva/sclera: Conjunctivae  normal.  Cardiovascular:     Rate and Rhythm: Normal rate.  Pulmonary:     Effort: Pulmonary effort is normal.  Abdominal:     Palpations: Abdomen is soft.     Tenderness: There is no abdominal tenderness. There is no right CVA tenderness or left CVA tenderness.  Genitourinary:    Comments: Deferred; patient performed self-swab for Aptima testing  Musculoskeletal:        General: Normal range of motion.     Cervical back: Normal range of motion and neck supple.  Skin:    General: Skin is warm and dry.  Neurological:     General: No focal deficit present.     Mental Status: She is alert and oriented to person, place, and time.  Psychiatric:        Mood and Affect: Mood normal.        Behavior: Behavior normal. Behavior is cooperative.      UC Treatments / Results  Labs (all labs ordered are listed, but only abnormal results are displayed) Labs Reviewed  POCT URINALYSIS DIP (MANUAL ENTRY) - Abnormal; Notable for the following components:      Result Value   Clarity, UA cloudy (*)    Leukocytes, UA Moderate (2+) (*)    All other components within normal limits  URINE CULTURE  POCT URINE PREGNANCY  CERVICOVAGINAL ANCILLARY ONLY    EKG   Radiology No results found.  Procedures Procedures (including critical care time)  Medications Ordered in UC Medications - No data to display  Initial Impression / Assessment and Plan / UC Course  I have reviewed the triage vital signs and the nursing notes.  Pertinent labs & imaging results that were available during my care of the patient were reviewed by me and considered in my medical decision making (see chart for details).    43 year old female presents with urinary frequency and urgency with decreased urine volume over the past few days, along with a history of vaginal discharge one  week ago that resolved after douching. She continues to experience a urinary odor. She denies fever, nausea, vomiting, abdominal pain, dysuria, vaginal itching, or irritation. Patient is afebrile and appears nontoxic. Physical exam findings are as noted above. Urinalysis reveals a moderate amount of leukocytes with no blood or nitrites. Urine pregnancy test is negative. Urine culture and tests for bacterial vaginosis, yeast, gonorrhea, chlamydia, and trichomonas are pending. She was prescribed Macrobid  and Pyridium . Patient advised to avoid sexual activity until all pending test results are back, treatment is completed, and symptoms have resolved.  Today's evaluation has revealed no signs of a dangerous process. Discussed diagnosis with patient and/or guardian. Patient and/or guardian aware of their diagnosis, possible red flag symptoms to watch out for and need for close follow up. Patient and/or guardian understands verbal and written discharge instructions. Patient and/or guardian comfortable with plan and disposition.  Patient and/or guardian has a clear mental status at this time, good insight into illness (after discussion and teaching) and has clear judgment to make decisions regarding their care  Documentation was completed with the aid of voice recognition software. Transcription may contain typographical errors. Final Clinical Impressions(s) / UC Diagnoses   Final diagnoses:  Dysuria  Acute cystitis without hematuria     Discharge Instructions      You have been diagnosed with a urinary tract infection (UTI) which can occur when the normal balance in the vagina changes. This can be influenced by a number of factors, including  new or multiple sexual partners, unprotected sex, douching, smoking, certain antibiotics, or pregnancy. It's also possible to develop a vaginal infection even without being sexually active. It is important that you take all of your medications exactly as prescribed,  even if you start to feel better before the medication is finished. Make sure to drink plenty of fluids throughout the day. This helps flush out your urinary system and keeps your urine clear or light yellow, which is a sign of good hydration. During this time, avoid douching or using vaginal sprays or deodorants. Wear cotton or cotton-lined underwear to improve airflow and reduce moisture. If you are sexually active, make sure you urinate after sexual intercourse.   Tests were performed today to check for bacteria, yeast, gonorrhea, chlamydia, and trichomonas. A urine culture has also been done. While waiting for the results, continue taking the medication as directed. You should begin to feel better within the next couple of days. You will only be contacted if any of your test results are positive. You can also review your results through your MyChart account. If your symptoms do not improve, please follow up with your primary care provider. Go to the ED if your symptoms get worse.      ED Prescriptions     Medication Sig Dispense Auth. Provider   nitrofurantoin , macrocrystal-monohydrate, (MACROBID ) 100 MG capsule Take 1 capsule (100 mg total) by mouth 2 (two) times daily. 10 capsule Maryruth Sol, FNP   phenazopyridine  (PYRIDIUM ) 200 MG tablet Take 1 tablet (200 mg total) by mouth 3 (three) times daily. 6 tablet Maryruth Sol, FNP      PDMP not reviewed this encounter.   Maryruth Sol, [place] 10/04/23 1427

## 2023-10-04 NOTE — Discharge Instructions (Addendum)
 You have been diagnosed with a urinary tract infection (UTI) which can occur when the normal balance in the vagina changes. This can be influenced by a number of factors, including new or multiple sexual partners, unprotected sex, douching, smoking, certain antibiotics, or pregnancy. It's also possible to develop a vaginal infection even without being sexually active. It is important that you take all of your medications exactly as prescribed, even if you start to feel better before the medication is finished. Make sure to drink plenty of fluids throughout the day. This helps flush out your urinary system and keeps your urine clear or light yellow, which is a sign of good hydration. During this time, avoid douching or using vaginal sprays or deodorants. Wear cotton or cotton-lined underwear to improve airflow and reduce moisture. If you are sexually active, make sure you urinate after sexual intercourse.   Tests were performed today to check for bacteria, yeast, gonorrhea, chlamydia, and trichomonas. A urine culture has also been done. While waiting for the results, continue taking the medication as directed. You should begin to feel better within the next couple of days. You will only be contacted if any of your test results are positive. You can also review your results through your MyChart account. If your symptoms do not improve, please follow up with your primary care provider. Go to the ED if your symptoms get worse.

## 2023-10-06 LAB — URINE CULTURE: Culture: >=100000 — AB

## 2023-10-07 ENCOUNTER — Telehealth: Payer: Self-pay

## 2023-10-07 LAB — CERVICOVAGINAL ANCILLARY ONLY
Bacterial Vaginitis (gardnerella): POSITIVE — AB
Candida Glabrata: NEGATIVE
Candida Vaginitis: NEGATIVE
Chlamydia: NEGATIVE
Comment: NEGATIVE
Comment: NEGATIVE
Comment: NEGATIVE
Comment: NEGATIVE
Comment: NEGATIVE
Comment: NORMAL
Neisseria Gonorrhea: NEGATIVE
Trichomonas: NEGATIVE

## 2023-10-07 MED ORDER — SULFAMETHOXAZOLE-TRIMETHOPRIM 800-160 MG PO TABS: 1.0000 | ORAL_TABLET | Freq: Two times a day (BID) | ORAL | 0 refills | Status: AC

## 2023-10-07 MED ORDER — METRONIDAZOLE 500 MG PO TABS: 500.0000 mg | ORAL_TABLET | Freq: Two times a day (BID) | ORAL | 0 refills | Status: AC

## 2023-10-07 NOTE — Addendum Note (Signed)
 Addended by: Sharilyn Davenport on: 10/07/2023 05:57 PM   Modules accepted: Orders

## 2023-10-07 NOTE — Telephone Encounter (Signed)
 Per protocol, pt to dc Macrobid  and begin treatment with Bactrim .

## 2023-10-07 NOTE — Telephone Encounter (Signed)
 Per protocol, pt requires tx with metronidazole. Rx sent to pharmacy on file.

## 2023-11-17 ENCOUNTER — Ambulatory Visit
Admission: EM | Admit: 2023-11-17 | Discharge: 2023-11-17 | Disposition: A | Discharge status: Home / Self Care | Attending: Family Medicine | Admitting: Family Medicine

## 2023-11-17 ENCOUNTER — Encounter: Payer: Self-pay | Admitting: Emergency Medicine

## 2023-11-17 DIAGNOSIS — R300 Dysuria: Secondary | ICD-10-CM | POA: Diagnosis present

## 2023-11-17 DIAGNOSIS — M545 Low back pain, unspecified: Secondary | ICD-10-CM | POA: Insufficient documentation

## 2023-11-17 DIAGNOSIS — N898 Other specified noninflammatory disorders of vagina: Secondary | ICD-10-CM | POA: Insufficient documentation

## 2023-11-17 LAB — POCT URINALYSIS DIP (MANUAL ENTRY)
Bilirubin, UA: NEGATIVE
Blood, UA: NEGATIVE
Glucose, UA: NEGATIVE mg/dL
Ketones, POC UA: NEGATIVE mg/dL
Leukocytes, UA: NEGATIVE
Nitrite, UA: NEGATIVE
Protein Ur, POC: NEGATIVE mg/dL
Spec Grav, UA: >=1.03 — AB (ref 1.010–1.025)
Urobilinogen, UA: 0.2 U/dL
pH, UA: 5.5 (ref 5.0–8.0)

## 2023-11-17 LAB — POCT URINE PREGNANCY: Preg Test, Ur: NEGATIVE

## 2023-11-17 NOTE — ED Triage Notes (Signed)
 Pt presents with back pain that started last night and urine odor x 3 days.

## 2023-11-17 NOTE — Discharge Instructions (Signed)
 The clinic will contact you with results of the urine culture, vaginal swab/STD testing done today if positive.  Lots of rest and fluids.  You may take over-the-counter Tylenol  or ibuprofen  as needed for your low back pain.  Heat and rest.  Please follow-up with your PCP in 2 days for recheck.  Please go to the ER if you develop any worsening symptoms.  Hope you feel better soon!

## 2023-11-17 NOTE — ED Provider Notes (Signed)
 [facility]    CSN: [id number] Arrival date & time: 11/17/23  1233      History   Chief Complaint Chief Complaint  Patient presents with   Back Pain   urine odor     HPI Hannah Cook is a 43 y.o. female presents for dysuria.  Patient reports 3 days of a urinary odor without burning, urgency, frequency.  No hematuria, fevers, nausea/vomiting, flank pain.  Has also had a vaginal odor with some discharge that is nonpruritic.  No known STD exposure but would like screening.  In addition she reports she was carrying a mattress upstairs yesterday and has since had low back pain that is intermittent and does not radiate.  No numbness/tingling/weakness of her lower extremities, no bowel or bladder incontinence, no saddle paresthesia.  She has not taken any OTC treatments for symptoms.  No other concerns at this time.   Back Pain   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   Thyroid disease    UTI (urinary tract infection)     Patient Active Problem List   Diagnosis Date Noted   Supervision of high risk pregnancy, antepartum 05/13/2019   AMA (advanced maternal age) multigravida 35+ 05/13/2019   Seizure disorder during pregnancy (HCC) 05/13/2019   Vaginal bleeding in pregnancy, third trimester 04/08/2017   Tobacco smoking complicating pregnancy 01/30/2017   History of intracranial aneurysm s/p coiling 11/05/2016   Obesity (BMI 30.0-34.9) 02/16/2014   Anxiety 03/10/2012    Past Surgical History:  Procedure Laterality Date   ANEURYSM COILING     BRAIN SURGERY     TONSILLECTOMY      OB History     Gravida  10   Para  4   Term  4   Preterm  0   AB  5   Living  4      SAB  1   IAB  3   Ectopic  0   Multiple  0   Live Births  4            Home Medications    Prior to  Admission medications   Medication Sig Start Date End Date Taking? Authorizing Provider  acetaminophen  (TYLENOL ) 325 MG tablet Take 650 mg by mouth every 6 (six) hours as needed.    [provider]  furosemide (LASIX) 20 MG tablet Take 20 mg by mouth daily as needed. 06/11/23   [provider]  lamoTRIgine  (LAMICTAL ) 100 MG tablet Take 200 mg by mouth 2 (two) times daily. 03/11/17   [provider]  nitrofurantoin , macrocrystal-monohydrate, (MACROBID ) 100 MG capsule Take 1 capsule (100 mg total) by mouth 2 (two) times daily. 10/04/23   Maryruth Sol, FNP  phenazopyridine  (PYRIDIUM ) 200 MG tablet Take 1 tablet (200 mg total) by mouth 3 (three) times daily. 10/04/23   Maryruth Sol, FNP  phentermine 15 MG capsule Take 15 mg by mouth every morning.    [provider]  Vitamin D , Ergocalciferol , (DRISDOL) 1.25 MG (50000 UNIT) CAPS capsule Take 1 capsule by mouth once a week. 10/22/20   [provider]  topiramate (TOPAMAX) 25 MG tablet Take 25 mg by mouth 2 (two) times daily.  09/06/11  [provider]    Family History  Family History  Problem Relation Age of Onset   Thyroid disease Mother    Aortic aneurysm Mother        heart   Aneurysm Mother    HIV/AIDS Father        drug use related   Hypertension Sister    Diabetes Sister    Asthma Daughter    Hypertension Maternal Aunt    Hypertension Maternal Uncle    Hypertension Maternal Grandmother    Diabetes Maternal Grandmother     Social History Social History   Tobacco Use   Smoking status: Every Day    Current packs/day: 1.00    Types: Cigarettes   Smokeless tobacco: Never   Tobacco comments:    2 cigarettes  Vaping Use   Vaping status: Never Used  Substance Use Topics   Alcohol use: Yes    Comment: occ   Drug use: No     Allergies   Patient has no known allergies.   Review of Systems Review of Systems  Genitourinary:        Vaginal and urinary odor   Musculoskeletal:  Positive for back pain.     Physical Exam Triage Vital Signs ED Triage Vitals  Encounter Vitals Group     BP 11/17/23 1327 112/73     Systolic BP Percentile --      Diastolic BP Percentile --      Pulse Rate 11/17/23 1327 86     Resp 11/17/23 1327 16     Temp 11/17/23 1327 99.4 F (37.4 C)     Temp Source 11/17/23 1327 Oral     SpO2 11/17/23 1327 94 %     Weight --      Height --      Head Circumference --      Peak Flow --      Pain Score 11/17/23 1326 6     Pain Loc --      Pain Education --      Exclude from Growth Chart --    No data found.  Updated Vital Signs BP 112/73 (BP Location: Left Arm)   Pulse 86   Temp 99.4 F (37.4 C) (Oral)   Resp 16   LMP 10/18/2023 (Approximate)   SpO2 94%   Visual Acuity Right Eye Distance:   Left Eye Distance:   Bilateral Distance:    Right Eye Near:   Left Eye Near:    Bilateral Near:     Physical Exam Vitals and nursing note reviewed.  Constitutional:      Appearance: Normal appearance.  HENT:     Head: Normocephalic and atraumatic.  Eyes:     Pupils: Pupils are equal, round, and reactive to light.  Cardiovascular:     Rate and Rhythm: Normal rate.  Pulmonary:     Effort: Pulmonary effort is normal.  Abdominal:     Tenderness: There is no right CVA tenderness or left CVA tenderness.  Musculoskeletal:     Lumbar back: Tenderness present. No swelling, edema, deformity, signs of trauma, lacerations, spasms or bony tenderness. Normal range of motion. Negative right straight leg raise test and negative left straight leg raise test.       Back:     Comments: Drink 5 out of 5 bilateral lower extremities  Skin:    General: Skin is warm and dry.  Neurological:     General: No focal deficit present.     Mental Status: She is alert and oriented to person,  place, and time.  Psychiatric:        Mood and Affect: Mood normal.        Behavior: Behavior normal.      UC Treatments / Results   Labs (all labs ordered are listed, but only abnormal results are displayed) Labs Reviewed  POCT URINALYSIS DIP (MANUAL ENTRY) - Abnormal; Notable for the following components:      Result Value   Spec Grav, UA >=1.030 (*)    All other components within normal limits  URINE CULTURE  POCT URINE PREGNANCY  CERVICOVAGINAL ANCILLARY ONLY    EKG   Radiology No results found.  Procedures Procedures (including critical care time)  Medications Ordered in UC Medications - No data to display  Initial Impression / Assessment and Plan / UC Course  I have reviewed the triage vital signs and the nursing notes.  Pertinent labs & imaging results that were available during my care of the patient were reviewed by me and considered in my medical decision making (see chart for details).  Clinical Course as of 11/17/23 1406  Sun Nov 17, 2023  1406 Temp recheck 98.2 oral  [JM]    Clinical Course User Index [JM] Alleen Arbour, NP    Reviewed exam and symptoms with patient.  No red flags.  Urine negative for UTI, will culture given symptoms.  Vaginal swab/STD testing is ordered we will contact for any positive results.  Discussed low back pain is musculoskeletal.  Patient will use OTC Tylenol  or ibuprofen  as needed.  Discussed rest, fluids, heat.  PCP follow-up in 2 days for recheck.  ER precautions reviewed and patient verbalized understanding. Final Clinical Impressions(s) / UC Diagnoses   Final diagnoses:  Dysuria  Vaginal odor  Acute bilateral low back pain without sciatica     Discharge Instructions      The clinic will contact you with results of the urine culture, vaginal swab/STD testing done today if positive.  Lots of rest and fluids.  You may take over-the-counter Tylenol  or ibuprofen  as needed for your low back pain.  Heat and rest.  Please follow-up with your PCP in 2 days for recheck.  Please go to the ER if you develop any worsening symptoms.  Hope you feel better  soon!  ED Prescriptions   None    PDMP not reviewed this encounter.   Alleen Arbour, NP 11/17/23 1406

## 2023-11-18 ENCOUNTER — Ambulatory Visit (HOSPITAL_COMMUNITY): Payer: Self-pay

## 2023-11-18 LAB — CERVICOVAGINAL ANCILLARY ONLY
Bacterial Vaginitis (gardnerella): POSITIVE — AB
Candida Glabrata: NEGATIVE
Candida Vaginitis: NEGATIVE
Chlamydia: NEGATIVE
Comment: NEGATIVE
Comment: NEGATIVE
Comment: NEGATIVE
Comment: NEGATIVE
Comment: NEGATIVE
Comment: NORMAL
Neisseria Gonorrhea: NEGATIVE
Trichomonas: NEGATIVE

## 2023-11-18 LAB — URINE CULTURE: Culture: NO GROWTH

## 2023-11-19 MED ORDER — METRONIDAZOLE 500 MG PO TABS: 500.0000 mg | ORAL_TABLET | Freq: Two times a day (BID) | ORAL | 0 refills | Status: AC

## 2023-12-03 ENCOUNTER — Ambulatory Visit
Admission: EM | Admit: 2023-12-03 | Discharge: 2023-12-03 | Disposition: A | Discharge status: Home / Self Care | Attending: Family Medicine | Admitting: Family Medicine

## 2023-12-03 DIAGNOSIS — S39012D Strain of muscle, fascia and tendon of lower back, subsequent encounter: Secondary | ICD-10-CM

## 2023-12-03 DIAGNOSIS — M545 Low back pain, unspecified: Secondary | ICD-10-CM

## 2023-12-03 MED ORDER — CELECOXIB 200 MG PO CAPS: 200.0000 mg | ORAL_CAPSULE | Freq: Two times a day (BID) | ORAL | 0 refills | Status: AC

## 2023-12-03 MED ORDER — CYCLOBENZAPRINE HCL 5 MG PO TABS: 5.0000 mg | ORAL_TABLET | Freq: Every evening | ORAL | 0 refills | Status: AC | PRN

## 2023-12-03 NOTE — ED Provider Notes (Signed)
 [facility]  Note:  This document was prepared using Conservation officer, historic buildings and may include unintentional dictation errors.  MRN: [id number] DOB: 11-01-1980  Subjective:   Hannah Cook is a 43 y.o. female presenting for for recheck on persistent lower back pain.  Symptoms started when she injured her back trying to carry a heavy mattress.  She was seen 3 weeks ago and has used conservative management but is not improving.  Pain is intermittent, feels like a stiffness, pain that just stays in her lower back.  No urinary symptoms.  Patient hydrates very well with 5 bottles of water daily.  No radicular symptoms, changes to bowel or urinary habits, saddle paresthesia.  No history of musculoskeletal disorders of the back.  No current facility-administered medications for this encounter.  Current Outpatient Medications:    acetaminophen  (TYLENOL ) 325 MG tablet, Take 650 mg by mouth every 6 (six) hours as needed., Disp: , Rfl:    furosemide (LASIX) 20 MG tablet, Take 20 mg by mouth daily as needed., Disp: , Rfl:    lamoTRIgine  (LAMICTAL ) 100 MG tablet, Take 200 mg by mouth 2 (two) times daily., Disp: , Rfl: 1   nitrofurantoin , macrocrystal-monohydrate, (MACROBID ) 100 MG capsule, Take 1 capsule (100 mg total) by mouth 2 (two) times daily., Disp: 10 capsule, Rfl: 0   phenazopyridine  (PYRIDIUM ) 200 MG tablet, Take 1 tablet (200 mg total) by mouth 3 (three) times daily., Disp: 6 tablet, Rfl: 0   phentermine 15 MG capsule, Take 15 mg by mouth every morning., Disp: , Rfl:    Vitamin D , Ergocalciferol , (DRISDOL) 1.25 MG (50000 UNIT) CAPS capsule, Take 1 capsule by mouth once a week., Disp: , Rfl:    No Known Allergies  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   Thyroid disease    UTI (urinary  tract infection)      Past Surgical History:  Procedure Laterality Date   ANEURYSM COILING     BRAIN SURGERY     TONSILLECTOMY      Family History  Problem Relation Age of Onset   Thyroid disease Mother    Aortic aneurysm Mother        heart   Aneurysm Mother    HIV/AIDS Father        drug use related   Hypertension Sister    Diabetes Sister    Asthma Daughter    Hypertension Maternal Aunt    Hypertension Maternal Uncle    Hypertension Maternal Grandmother    Diabetes Maternal Grandmother     Social History   Tobacco Use   Smoking status: Every Day    Current packs/day: 1.00    Types: Cigarettes   Smokeless tobacco: Never   Tobacco comments:    2 cigarettes  Vaping Use   Vaping status: Never Used  Substance Use Topics   Alcohol use: Yes    Comment: occ   Drug use: No    ROS   Objective:   Vitals: BP 118/75 (BP Location: Left Arm)   Pulse 95   Temp 99.3 F (37.4 C)   Resp 16   LMP 11/18/2023 (Approximate)   SpO2 92%   Physical Exam Constitutional:      General: She is not  in acute distress.    Appearance: Normal appearance. She is well-developed. She is not ill-appearing, toxic-appearing or diaphoretic.  HENT:     Head: Normocephalic and atraumatic.     Nose: Nose normal.     Mouth/Throat:     Mouth: Mucous membranes are moist.   Eyes:     General: No scleral icterus.       Right eye: No discharge.        Left eye: No discharge.     Extraocular Movements: Extraocular movements intact.    Cardiovascular:     Rate and Rhythm: Normal rate.  Pulmonary:     Effort: Pulmonary effort is normal.   Musculoskeletal:     Lumbar back: Spasms and tenderness (lumbar paraspinal muscles) present. No swelling, edema, deformity, signs of trauma, lacerations or bony tenderness. Normal range of motion. Negative right straight leg raise test and negative left straight leg raise test. No scoliosis.   Skin:    General: Skin is warm and dry.    Neurological:     General: No focal deficit present.     Mental Status: She is alert and oriented to person, place, and time.     Motor: No weakness.     Coordination: Coordination normal.     Gait: Gait normal.     Deep Tendon Reflexes: Reflexes normal.   Psychiatric:        Mood and Affect: Mood normal.        Behavior: Behavior normal.        Thought Content: Thought content normal.        Judgment: Judgment normal.     Assessment and Plan :   PDMP not reviewed this encounter.  1. Acute bilateral low back pain without sciatica   2. Lumbar strain, subsequent encounter    Offered imaging but patient declined and I am in agreement.  Recommended bandaging for lumbar strain.  She has not taken any NSAID or muscle relaxant.  Recommended doing so with celecoxib and cyclobenzaprine .  Follow-up with the spine specialty clinic.  My hope is that they consider physical therapy or pursue imaging as deemed appropriate.  No signs of an acute spinal emergency.  Counseled patient on potential for adverse effects with medications prescribed/recommended today, ER and return-to-clinic precautions discussed, patient verbalized understanding.    Adolph Hoop, [place] 12/03/23 1610

## 2023-12-03 NOTE — ED Triage Notes (Signed)
 Pt c/o mid back pain injury ~3 weeks ago-states she was seen here for same-c/o cont'd pain-taking tylenol  with some relief-NAD-steady gait

## 2023-12-23 ENCOUNTER — Other Ambulatory Visit: Payer: Self-pay

## 2023-12-23 ENCOUNTER — Ambulatory Visit
Admission: EM | Admit: 2023-12-23 | Discharge: 2023-12-23 | Disposition: A | Discharge status: Home / Self Care | Attending: Family Medicine | Admitting: Family Medicine

## 2023-12-23 DIAGNOSIS — N898 Other specified noninflammatory disorders of vagina: Secondary | ICD-10-CM | POA: Insufficient documentation

## 2023-12-23 LAB — POCT URINALYSIS DIP (MANUAL ENTRY)
Bilirubin, UA: NEGATIVE
Blood, UA: NEGATIVE
Glucose, UA: NEGATIVE mg/dL
Ketones, POC UA: NEGATIVE mg/dL
Leukocytes, UA: NEGATIVE
Nitrite, UA: NEGATIVE
Protein Ur, POC: NEGATIVE mg/dL
Spec Grav, UA: >=1.03 — AB (ref 1.010–1.025)
Urobilinogen, UA: 0.2 U/dL
pH, UA: 6 (ref 5.0–8.0)

## 2023-12-23 LAB — POCT URINE PREGNANCY: Preg Test, Ur: NEGATIVE

## 2023-12-23 MED ORDER — FLUCONAZOLE 150 MG PO TABS: 150.0000 mg | ORAL_TABLET | Freq: Every day | ORAL | 0 refills | Status: AC

## 2023-12-23 NOTE — ED Provider Notes (Signed)
 [facility]    CSN: [id number] Arrival date & time: 12/23/23  1715      History   Chief Complaint No chief complaint on file.   HPI Hannah Cook is a 43 y.o. female presents for vaginal discharge and urinary odor.  Patient reports 3 days of a mildly pruritic malodorous vaginal discharge with malodorous urine.  Denies any dysuria, urgency, frequency, fevers, nausea/vomiting, flank pain.  No known STD exposure would like screening.  Reports history of yeast infections and states this feels consistent with that.  No OTC treatments have been used since onset.  No other concerns at this time.  HPI  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   Thyroid disease    UTI (urinary tract infection)     Patient Active Problem List   Diagnosis Date Noted   Supervision of high risk pregnancy, antepartum 05/13/2019   AMA (advanced maternal age) multigravida 35+ 05/13/2019   Seizure disorder during pregnancy (HCC) 05/13/2019   Vaginal bleeding in pregnancy, third trimester 04/08/2017   Tobacco smoking complicating pregnancy 01/30/2017   History of intracranial aneurysm s/p coiling 11/05/2016   Obesity (BMI 30.0-34.9) 02/16/2014   Anxiety 03/10/2012    Past Surgical History:  Procedure Laterality Date   ANEURYSM COILING     BRAIN SURGERY     TONSILLECTOMY      OB History     Gravida  10   Para  4   Term  4   Preterm  0   AB  5   Living  4      SAB  1   IAB  3   Ectopic  0   Multiple  0   Live Births  4            Home Medications    Prior to Admission medications   Medication Sig Start Date End Date Taking? Authorizing Provider  fluconazole  (DIFLUCAN ) 150 MG tablet Take 1 tablet (150 mg total) by mouth daily for 2 doses. Take 1 tablet today and you may repeat in 3 days if  symptoms persist 12/23/23 12/25/23 Yes Berle Fitz, Jodi R, NP  acetaminophen  (TYLENOL ) 325 MG tablet Take 650 mg by mouth every 6 (six) hours as needed.    [provider]  celecoxib  (CELEBREX ) 200 MG capsule Take 1 capsule (200 mg total) by mouth 2 (two) times daily. 12/03/23   Christopher Savannah, PA-C  cyclobenzaprine  (FLEXERIL ) 5 MG tablet Take 1 tablet (5 mg total) by mouth at bedtime as needed. 12/03/23   Christopher Savannah, PA-C  furosemide (LASIX) 20 MG tablet Take 20 mg by mouth daily as needed. 06/11/23   [provider]  lamoTRIgine  (LAMICTAL ) 100 MG tablet Take 200 mg by mouth 2 (two) times daily. 03/11/17   [provider]  nitrofurantoin , macrocrystal-monohydrate, (MACROBID ) 100 MG capsule Take 1 capsule (100 mg total) by mouth 2 (two) times daily. 10/04/23   Iola Lukes, FNP  phenazopyridine  (PYRIDIUM ) 200 MG tablet Take 1 tablet (200 mg total) by mouth 3 (three) times daily. 10/04/23   Iola Lukes, FNP  phentermine 15 MG capsule Take 15 mg by mouth every morning.    [provider]  Vitamin D , Ergocalciferol , (DRISDOL) 1.25 MG (50000 UNIT) CAPS capsule Take  1 capsule by mouth once a week. 10/22/20   [provider]  topiramate (TOPAMAX) 25 MG tablet Take 25 mg by mouth 2 (two) times daily.  09/06/11  [provider]    Family History Family History  Problem Relation Age of Onset   Thyroid disease Mother    Aortic aneurysm Mother        heart   Aneurysm Mother    HIV/AIDS Father        drug use related   Hypertension Sister    Diabetes Sister    Asthma Daughter    Hypertension Maternal Aunt    Hypertension Maternal Uncle    Hypertension Maternal Grandmother    Diabetes Maternal Grandmother     Social History Social History   Tobacco Use   Smoking status: Every Day    Current packs/day: 1.00    Types: Cigarettes   Smokeless tobacco: Never   Tobacco comments:    2 cigarettes  Vaping Use   Vaping status: Never Used  Substance  Use Topics   Alcohol use: Yes    Comment: occ   Drug use: No     Allergies   Patient has no known allergies.   Review of Systems Review of Systems  Genitourinary:  Positive for vaginal discharge.     Physical Exam Triage Vital Signs ED Triage Vitals  Encounter Vitals Group     BP 12/23/23 1724 111/73     Girls Systolic BP Percentile --      Girls Diastolic BP Percentile --      Boys Systolic BP Percentile --      Boys Diastolic BP Percentile --      Pulse Rate 12/23/23 1724 83     Resp 12/23/23 1724 16     Temp 12/23/23 1724 99 F (37.2 C)     Temp Source 12/23/23 1724 Oral     SpO2 12/23/23 1724 97 %     Weight --      Height --      Head Circumference --      Peak Flow --      Pain Score 12/23/23 1721 0     Pain Loc --      Pain Education --      Exclude from Growth Chart --    No data found.  Updated Vital Signs BP 111/73   Pulse 83   Temp 99 F (37.2 C) (Oral)   Resp 16   LMP 12/09/2023 (Approximate)   SpO2 97%   Visual Acuity Right Eye Distance:   Left Eye Distance:   Bilateral Distance:    Right Eye Near:   Left Eye Near:    Bilateral Near:     Physical Exam Vitals and nursing note reviewed.  Constitutional:      Appearance: Normal appearance.  HENT:     Head: Normocephalic and atraumatic.  Eyes:     Pupils: Pupils are equal, round, and reactive to light.  Cardiovascular:     Rate and Rhythm: Normal rate.  Pulmonary:     Effort: Pulmonary effort is normal.  Skin:    General: Skin is warm and dry.  Neurological:     General: No focal deficit present.     Mental Status: She is alert and oriented to person, place, and time.  Psychiatric:        Mood and Affect: Mood normal.        Behavior: Behavior normal.  UC Treatments / Results  Labs (all labs ordered are listed, but only abnormal results are displayed) Labs Reviewed  POCT URINALYSIS DIP (MANUAL ENTRY) - Abnormal; Notable for the following components:      Result  Value   Spec Grav, UA >=1.030 (*)    All other components within normal limits  POCT URINE PREGNANCY  CERVICOVAGINAL ANCILLARY ONLY    EKG   Radiology No results found.  Procedures Procedures (including critical care time)  Medications Ordered in UC Medications - No data to display  Initial Impression / Assessment and Plan / UC Course  I have reviewed the triage vital signs and the nursing notes.  Pertinent labs & imaging results that were available during my care of the patient were reviewed by me and considered in my medical decision making (see chart for details).     Reviewed exam and symptoms with patient.  No red flags.  Urine negative for UTI.  Vaginal swab/STD testing is ordered we will contact positive results.  Will start Diflucan  for yeast infection symptoms.  Advised rest fluids and PCP or GYN follow up if symptoms do not improve.  ER precautions reviewed. Final Clinical Impressions(s) / UC Diagnoses   Final diagnoses:  Vaginal discharge     Discharge Instructions      The clinic will contact you with results of the vaginal swab done today if positive.  Start Diflucan  as prescribed.  Lots of rest and fluids.  Please follow-up with your PCP or gynecologist if your symptoms do not improve.  Please go to the ER for any worsening symptoms.  Hope you feel better soon!    ED Prescriptions     Medication Sig Dispense Auth. Provider   fluconazole  (DIFLUCAN ) 150 MG tablet Take 1 tablet (150 mg total) by mouth daily for 2 doses. Take 1 tablet today and you may repeat in 3 days if symptoms persist 2 tablet Drury Ardizzone, Jodi R, NP      PDMP not reviewed this encounter.   Loreda Myla SAUNDERS, NP 12/23/23 [phone]

## 2023-12-23 NOTE — ED Triage Notes (Signed)
 Pt c/o strong urinary odorx3d. Pt denies any other sx.

## 2023-12-23 NOTE — Discharge Instructions (Addendum)
 The clinic will contact you with results of the vaginal swab done today if positive.  Start Diflucan  as prescribed.  Lots of rest and fluids.  Please follow-up with your PCP or gynecologist if your symptoms do not improve.  Please go to the ER for any worsening symptoms.  Hope you feel better soon!

## 2023-12-24 ENCOUNTER — Ambulatory Visit (HOSPITAL_COMMUNITY): Payer: Self-pay

## 2023-12-24 LAB — CERVICOVAGINAL ANCILLARY ONLY
Bacterial Vaginitis (gardnerella): POSITIVE — AB
Candida Glabrata: NEGATIVE
Candida Vaginitis: NEGATIVE
Chlamydia: NEGATIVE
Comment: NEGATIVE
Comment: NEGATIVE
Comment: NEGATIVE
Comment: NEGATIVE
Comment: NEGATIVE
Comment: NORMAL
Neisseria Gonorrhea: NEGATIVE
Trichomonas: NEGATIVE

## 2023-12-24 MED ORDER — METRONIDAZOLE 500 MG PO TABS: 500.0000 mg | ORAL_TABLET | Freq: Two times a day (BID) | ORAL | 0 refills | Status: AC

## 2024-06-26 ENCOUNTER — Ambulatory Visit
Admission: EM | Admit: 2024-06-26 | Discharge: 2024-06-26 | Disposition: A | Discharge status: Home / Self Care | Attending: Nurse Practitioner | Admitting: Nurse Practitioner

## 2024-06-26 DIAGNOSIS — B9689 Other specified bacterial agents as the cause of diseases classified elsewhere: Secondary | ICD-10-CM | POA: Diagnosis not present

## 2024-06-26 DIAGNOSIS — Z3202 Encounter for pregnancy test, result negative: Secondary | ICD-10-CM | POA: Diagnosis present

## 2024-06-26 DIAGNOSIS — N760 Acute vaginitis: Secondary | ICD-10-CM | POA: Insufficient documentation

## 2024-06-26 DIAGNOSIS — N898 Other specified noninflammatory disorders of vagina: Secondary | ICD-10-CM | POA: Insufficient documentation

## 2024-06-26 LAB — POCT URINE DIPSTICK
Bilirubin, UA: NEGATIVE
Blood, UA: NEGATIVE
Glucose, UA: NEGATIVE mg/dL
Leukocytes, UA: NEGATIVE
Nitrite, UA: NEGATIVE
POC PROTEIN,UA: NEGATIVE
Spec Grav, UA: 1.02
Urobilinogen, UA: 0.2 U/dL
pH, UA: 7

## 2024-06-26 LAB — POCT URINE PREGNANCY: Preg Test, Ur: NEGATIVE

## 2024-06-26 MED ORDER — METRONIDAZOLE 500 MG PO TABS: 500.0000 mg | ORAL_TABLET | Freq: Two times a day (BID) | ORAL | 0 refills | Status: AC

## 2024-06-26 MED ORDER — FLUCONAZOLE 150 MG PO TABS: 150.0000 mg | ORAL_TABLET | ORAL | 0 refills | Status: AC

## 2024-06-26 NOTE — ED Triage Notes (Addendum)
 Pt present with c/o a bad odor in her urine. Pt states this is day 2. Pt denies other symptoms. States she thinks she has a UTI. Pt is requesting a pregnancy test

## 2024-06-26 NOTE — ED Provider Notes (Signed)
 " [facility]    CSN: [id number] Arrival date & time: 06/26/24  1443      History   Chief Complaint Chief Complaint  Patient presents with   Vaginal Odor    HPI Hannah Cook is a 44 y.o. female.   Discussed the use of AI scribe software for clinical note transcription with the patient, who gave verbal consent to proceed.   Hannah Cook is a 44 year old female presenting with odor to her urine for the past 2 days. She reports having white discharge on the first day which has since resolved, with no associated itching or irritation. She denies increased urinary frequency, nausea, vomiting, fever, or abdominal pain. The patient has a history of recurrent infections, including bacterial vaginosis, UTI and yeast in April, June, and July. She notes that she tends to develop infections after coming off her period. LMP 06/01/24. Sexually active with one female partner and does not use condoms.   The following sections of the patient's history were reviewed and updated as appropriate: allergies, current medications, past family history, past medical history, past social history, past surgical history, and problem list.     Past Medical History:  Diagnosis Date   Aneurysm 2002   Asthma    Carpal tunnel syndrome on both sides    Hypothyroidism    Panic attack    Seizures (HCC)    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   Thyroid disease    UTI (urinary tract infection)     Patient Active Problem List   Diagnosis Date Noted   Supervision of high risk pregnancy, antepartum 05/13/2019   AMA (advanced maternal age) multigravida 35+ 05/13/2019   Seizure disorder during pregnancy (HCC) 05/13/2019   Vaginal bleeding in pregnancy, third trimester 04/08/2017   Tobacco smoking complicating pregnancy 01/30/2017   History of intracranial aneurysm s/p coiling 11/05/2016   Obesity (BMI 30.0-34.9) 02/16/2014   Anxiety 03/10/2012    Past Surgical History:  Procedure  Laterality Date   ANEURYSM COILING     BRAIN SURGERY     TONSILLECTOMY      OB History     Gravida  10   Para  4   Term  4   Preterm  0   AB  5   Living  4      SAB  1   IAB  3   Ectopic  0   Multiple  0   Live Births  4            Home Medications    Prior to Admission medications  Medication Sig Start Date End Date Taking? Authorizing Provider  fluconazole  (DIFLUCAN ) 150 MG tablet Take 1 tablet (150 mg total) by mouth every 3 (three) days for 2 doses. 06/26/24 06/30/24 Yes Iola Lukes, FNP  metroNIDAZOLE  (FLAGYL ) 500 MG tablet Take 1 tablet (500 mg total) by mouth 2 (two) times daily. 06/26/24  Yes Mathias Bogacki, FNP  acetaminophen  (TYLENOL ) 325 MG tablet Take 650 mg by mouth every 6 (six) hours as needed.    [provider]  celecoxib  (CELEBREX ) 200 MG capsule Take 1 capsule (200 mg total) by mouth 2 (two) times daily. 12/03/23   Christopher Savannah, PA-C  cyclobenzaprine  (FLEXERIL ) 5 MG tablet Take 1 tablet (5 mg total) by mouth at bedtime as needed. 12/03/23   Christopher Savannah, PA-C  furosemide (LASIX) 20 MG tablet Take 20 mg by mouth daily as needed. 06/11/23   [provider]  lamoTRIgine  (  LAMICTAL ) 100 MG tablet Take 200 mg by mouth 2 (two) times daily. 03/11/17   [provider]  nitrofurantoin , macrocrystal-monohydrate, (MACROBID ) 100 MG capsule Take 1 capsule (100 mg total) by mouth 2 (two) times daily. 10/04/23   Iola Lukes, FNP  phenazopyridine  (PYRIDIUM ) 200 MG tablet Take 1 tablet (200 mg total) by mouth 3 (three) times daily. 10/04/23   Iola Lukes, FNP  phentermine 15 MG capsule Take 15 mg by mouth every morning.    [provider]  Vitamin D , Ergocalciferol , (DRISDOL) 1.25 MG (50000 UNIT) CAPS capsule Take 1 capsule by mouth once a week. 10/22/20   [provider]  topiramate (TOPAMAX) 25 MG tablet Take 25 mg by mouth 2 (two) times daily.  09/06/11  [provider]    Family History Family  History  Problem Relation Age of Onset   Thyroid disease Mother    Aortic aneurysm Mother        heart   Aneurysm Mother    HIV/AIDS Father        drug use related   Hypertension Sister    Diabetes Sister    Asthma Daughter    Hypertension Maternal Aunt    Hypertension Maternal Uncle    Hypertension Maternal Grandmother    Diabetes Maternal Grandmother     Social History Social History[1]   Allergies   Patient has no known allergies.   Review of Systems Review of Systems  Constitutional:  Negative for fever.  Gastrointestinal:  Negative for abdominal pain, nausea and vomiting.  Genitourinary:  Positive for vaginal discharge. Negative for dysuria, frequency and menstrual problem (LMP 06/01/24).       Odor. No itching or irritation   All other systems reviewed and are negative.    Physical Exam Triage Vital Signs ED Triage Vitals  Encounter Vitals Group     BP 06/26/24 1612 110/67     Girls Systolic BP Percentile --      Girls Diastolic BP Percentile --      Boys Systolic BP Percentile --      Boys Diastolic BP Percentile --      Pulse Rate 06/26/24 1612 89     Resp 06/26/24 1612 17     Temp --      Temp src --      SpO2 06/26/24 1612 98 %     Weight --      Height --      Head Circumference --      Peak Flow --      Pain Score 06/26/24 1610 0     Pain Loc --      Pain Education --      Exclude from Growth Chart --    No data found.  Updated Vital Signs BP 110/67   Pulse 89   Resp 17   LMP 06/01/2024   SpO2 98%   Visual Acuity Right Eye Distance:   Left Eye Distance:   Bilateral Distance:    Right Eye Near:   Left Eye Near:    Bilateral Near:     Physical Exam Constitutional:      General: She is not in acute distress.    Appearance: Normal appearance. She is not ill-appearing, toxic-appearing or diaphoretic.  HENT:     Head: Normocephalic.     Nose: Nose normal.     Mouth/Throat:     Mouth: Mucous membranes are moist.  Eyes:      Conjunctiva/sclera: Conjunctivae normal.  Cardiovascular:     Rate and Rhythm: Normal rate.  Pulmonary:     Effort: Pulmonary effort is normal.  Abdominal:     Palpations: Abdomen is soft.  Genitourinary:    Comments: Deferred; patient performed self-swab for Aptima testing  Musculoskeletal:        General: Normal range of motion.     Cervical back: Normal range of motion and neck supple.  Skin:    General: Skin is warm and dry.  Neurological:     General: No focal deficit present.     Mental Status: She is alert and oriented to person, place, and time.  Psychiatric:        Mood and Affect: Mood normal.        Behavior: Behavior normal.      UC Treatments / Results  Labs (all labs ordered are listed, but only abnormal results are displayed) Labs Reviewed  POCT URINE DIPSTICK - Abnormal; Notable for the following components:      Result Value   Ketones, POC UA trace (5) (*)    All other components within normal limits  URINE CULTURE  POCT URINE PREGNANCY  CERVICOVAGINAL ANCILLARY ONLY    EKG   Radiology No results found.  Procedures Procedures (including critical care time)  Medications Ordered in UC Medications - No data to display  Initial Impression / Assessment and Plan / UC Course  I have reviewed the triage vital signs and the nursing notes.  Pertinent labs & imaging results that were available during my care of the patient were reviewed by me and considered in my medical decision making (see chart for details).    Patient presents with vaginal discharge and urinary odor. Vaginal swabs were collected for gonorrhea, chlamydia, trichomonas, yeast, and bacterial vaginosis testing; results are pending. Empiric treatment initiated with metronidazole  (Flagyl ) and fluconazole  (Diflucan ). Further management will be guided by pending test results. The patient was advised to maintain adequate hydration and to use barrier protection if sexually active to help reduce  the risk of recurrent infection. She was informed that she will be contacted only if results are positive, though all results will be available for review through her MyChart account. She was instructed to follow up with her primary care provider or gynecologist if symptoms do not improve with treatment and to seek medical attention sooner if she develops fever, pelvic pain, or worsening discharge.  Today's evaluation has revealed no signs of a dangerous process. Discussed diagnosis with patient and/or guardian. Patient and/or guardian aware of their diagnosis, possible red flag symptoms to watch out for and need for close follow up. Patient and/or guardian understands verbal and written discharge instructions. Patient and/or guardian comfortable with plan and disposition.  Patient and/or guardian has a clear mental status at this time, good insight into illness (after discussion and teaching) and has clear judgment to make decisions regarding their care  Documentation was completed with the aid of voice recognition software. Transcription may contain typographical errors.    Final Clinical Impressions(s) / UC Diagnoses   Final diagnoses:  Vaginal odor  Acute vaginitis     Discharge Instructions      You were seen today for vaginal discharge and odor, which can occur when the normal balance of bacteria in the vagina changes. This can be influenced by various factors, including new or multiple sexual partners, unprotected sex, douching, smoking, certain antibiotics, or pregnancy. Its also possible to develop a vaginal infection even without being sexually active.  Tests were performed today to check for bacteria, yeast, gonorrhea, chlamydia, and trichomonas. I also sent off culture of your urine to check for urinary tract infection. While results are pending, treatment has been initiated for the most common non-STD causes--bacterial vaginosis and yeast infection. It is important that you avoid  any sexual activity until your test results have returned, your treatment is complete, and your symptoms have fully resolved. You will only be contacted if any of your test results are positive. You can also review your results through your MyChart account.  During this time, avoid douching or using vaginal sprays or deodorants. Wear cotton or cotton-lined underwear to improve airflow and reduce moisture. Be sure to stay hydrated by drinking plenty of fluids. Please note that while you are taking the prescribed Flagyl  (metronidazole ) it is very important to avoid drinking alcohol while on this medication. Drinking alcohol can cause severe side effects, such as nausea, vomiting, and headaches.  See your regular doctor or gynecologist if your symptoms do not start to improve with treatment. Go to the emergency room right away if you develop a fever, new or worsening pelvic pain, or if the vaginal discharge gets worse.        ED Prescriptions     Medication Sig Dispense Auth. Provider   metroNIDAZOLE  (FLAGYL ) 500 MG tablet Take 1 tablet (500 mg total) by mouth 2 (two) times daily. 14 tablet Santresa Levett, [place], FNP   fluconazole  (DIFLUCAN ) 150 MG tablet Take 1 tablet (150 mg total) by mouth every 3 (three) days for 2 doses. 2 tablet Iola Lukes, FNP      PDMP not reviewed this encounter.     [1]  Social History Tobacco Use   Smoking status: Every Day    Current packs/day: 1.00    Types: Cigarettes   Smokeless tobacco: Never   Tobacco comments:    2 cigarettes  Vaping Use   Vaping status: Never Used  Substance Use Topics   Alcohol use: Yes    Comment: occ   Drug use: No     Iola Lukes, FNP 06/26/24 1754  "

## 2024-06-26 NOTE — Discharge Instructions (Addendum)
 You were seen today for vaginal discharge and odor, which can occur when the normal balance of bacteria in the vagina changes. This can be influenced by various factors, including new or multiple sexual partners, unprotected sex, douching, smoking, certain antibiotics, or pregnancy. Its also possible to develop a vaginal infection even without being sexually active.  Tests were performed today to check for bacteria, yeast, gonorrhea, chlamydia, and trichomonas. I also sent off culture of your urine to check for urinary tract infection. While results are pending, treatment has been initiated for the most common non-STD causes--bacterial vaginosis and yeast infection. It is important that you avoid any sexual activity until your test results have returned, your treatment is complete, and your symptoms have fully resolved. You will only be contacted if any of your test results are positive. You can also review your results through your MyChart account.  During this time, avoid douching or using vaginal sprays or deodorants. Wear cotton or cotton-lined underwear to improve airflow and reduce moisture. Be sure to stay hydrated by drinking plenty of fluids. Please note that while you are taking the prescribed Flagyl  (metronidazole ) it is very important to avoid drinking alcohol while on this medication. Drinking alcohol can cause severe side effects, such as nausea, vomiting, and headaches.  See your regular doctor or gynecologist if your symptoms do not start to improve with treatment. Go to the emergency room right away if you develop a fever, new or worsening pelvic pain, or if the vaginal discharge gets worse.

## 2024-06-28 LAB — URINE CULTURE: Culture: NO GROWTH

## 2024-06-29 ENCOUNTER — Ambulatory Visit (HOSPITAL_COMMUNITY): Payer: Self-pay

## 2024-06-29 LAB — CERVICOVAGINAL ANCILLARY ONLY
Bacterial Vaginitis (gardnerella): POSITIVE — AB
Candida Glabrata: NEGATIVE
Candida Vaginitis: NEGATIVE
Chlamydia: NEGATIVE
Comment: NEGATIVE
Comment: NEGATIVE
Comment: NEGATIVE
Comment: NEGATIVE
Comment: NEGATIVE
Comment: NORMAL
Neisseria Gonorrhea: NEGATIVE
Trichomonas: NEGATIVE

## 2024-06-29 NOTE — Progress Notes (Signed)
 Cervicovaginal testing is positive for bacterial vaginosis. The patient should continue taking the prescribed metronidazole  as directed and complete the full course to ensure adequate treatment of the infection.

## 2024-07-23 ENCOUNTER — Ambulatory Visit
Admission: EM | Admit: 2024-07-23 | Discharge: 2024-07-23 | Discharge status: Against Medical Advice | Source: Home / Self Care

## 2024-07-23 NOTE — ED Notes (Signed)
 I called patient and she stated she was not here anymore we where taking to long.

## 2024-07-24 ENCOUNTER — Ambulatory Visit
Admission: RE | Admit: 2024-07-24 | Discharge: 2024-07-24 | Disposition: A | Discharge status: Home / Self Care | Source: Ambulatory Visit

## 2024-07-24 NOTE — ED Triage Notes (Signed)
 Pt presents with concern of boil or abscess to gluteal cleft x3 days. Notes recurrent hx of similar issues - last episode 7-8 months ago. Pt notes this time time it is within gluteal cleft, more to R side. Reports it started draining earlier today but she is have consistent dull pain to area. No med use for symptoms. Cannot describe drainage.   She sees dermatology in [facility] - they usually do I&D procedures there as needed.
